# Patient Record
Sex: Female | Born: 1955 | Race: White | Hispanic: No | Marital: Married | Smoking: Never smoker
Health system: Southern US, Community
[De-identification: ages and names within clinical notes are randomized; demographics above are authoritative.]

## PROBLEM LIST (undated history)

## (undated) DIAGNOSIS — Z9221 Personal history of antineoplastic chemotherapy: Secondary | ICD-10-CM

## (undated) DIAGNOSIS — F329 Major depressive disorder, single episode, unspecified: Secondary | ICD-10-CM

## (undated) DIAGNOSIS — F32A Depression, unspecified: Secondary | ICD-10-CM

## (undated) DIAGNOSIS — Z803 Family history of malignant neoplasm of breast: Secondary | ICD-10-CM

## (undated) DIAGNOSIS — C50911 Malignant neoplasm of unspecified site of right female breast: Secondary | ICD-10-CM

## (undated) DIAGNOSIS — R42 Dizziness and giddiness: Secondary | ICD-10-CM

## (undated) DIAGNOSIS — I499 Cardiac arrhythmia, unspecified: Secondary | ICD-10-CM

## (undated) DIAGNOSIS — C44310 Basal cell carcinoma of skin of unspecified parts of face: Secondary | ICD-10-CM

## (undated) DIAGNOSIS — F411 Generalized anxiety disorder: Secondary | ICD-10-CM

## (undated) DIAGNOSIS — Z923 Personal history of irradiation: Secondary | ICD-10-CM

## (undated) HISTORY — PX: ABDOMINAL HYSTERECTOMY: SHX81

## (undated) HISTORY — PX: COLONOSCOPY: SHX174

## (undated) HISTORY — PX: BREAST BIOPSY: SHX20

## (undated) HISTORY — PX: BREAST EXCISIONAL BIOPSY: SUR124

## (undated) HISTORY — PX: TONSILLECTOMY: SUR1361

## (undated) HISTORY — DX: Dizziness and giddiness: R42

## (undated) HISTORY — PX: BASAL CELL CARCINOMA EXCISION: SHX1214

## (undated) HISTORY — DX: Family history of malignant neoplasm of breast: Z80.3

## (undated) HISTORY — DX: Generalized anxiety disorder: F41.1

---

## 2000-05-03 ENCOUNTER — Encounter: Payer: Self-pay | Admitting: Gynecology

## 2000-05-03 ENCOUNTER — Encounter: Admission: RE | Admit: 2000-05-03 | Discharge: 2000-05-03 | Payer: Self-pay | Admitting: Gynecology

## 2000-05-09 ENCOUNTER — Encounter: Payer: Self-pay | Admitting: Gynecology

## 2000-05-09 ENCOUNTER — Encounter: Admission: RE | Admit: 2000-05-09 | Discharge: 2000-05-09 | Payer: Self-pay | Admitting: Gynecology

## 2000-06-13 ENCOUNTER — Other Ambulatory Visit: Admission: RE | Admit: 2000-06-13 | Discharge: 2000-06-13 | Payer: Self-pay | Admitting: Gynecology

## 2002-02-04 ENCOUNTER — Other Ambulatory Visit: Admission: RE | Admit: 2002-02-04 | Discharge: 2002-02-04 | Payer: Self-pay | Admitting: Gynecology

## 2002-09-24 ENCOUNTER — Encounter: Admission: RE | Admit: 2002-09-24 | Discharge: 2002-09-24 | Payer: Self-pay | Admitting: Gynecology

## 2002-09-24 ENCOUNTER — Encounter: Payer: Self-pay | Admitting: Gynecology

## 2003-09-30 ENCOUNTER — Encounter: Admission: RE | Admit: 2003-09-30 | Discharge: 2003-09-30 | Payer: Self-pay | Admitting: Gynecology

## 2003-12-31 ENCOUNTER — Other Ambulatory Visit: Admission: RE | Admit: 2003-12-31 | Discharge: 2003-12-31 | Payer: Self-pay | Admitting: Gynecology

## 2004-10-16 DIAGNOSIS — C50911 Malignant neoplasm of unspecified site of right female breast: Secondary | ICD-10-CM | POA: Insufficient documentation

## 2004-10-20 ENCOUNTER — Encounter: Admission: RE | Admit: 2004-10-20 | Discharge: 2004-10-20 | Payer: Self-pay | Admitting: Gynecology

## 2004-10-20 ENCOUNTER — Encounter (INDEPENDENT_AMBULATORY_CARE_PROVIDER_SITE_OTHER): Payer: Self-pay | Admitting: *Deleted

## 2004-10-20 ENCOUNTER — Encounter (INDEPENDENT_AMBULATORY_CARE_PROVIDER_SITE_OTHER): Payer: Self-pay | Admitting: Radiology

## 2004-10-20 DIAGNOSIS — C50911 Malignant neoplasm of unspecified site of right female breast: Secondary | ICD-10-CM

## 2004-10-20 HISTORY — PX: BREAST BIOPSY: SHX20

## 2004-10-20 HISTORY — DX: Malignant neoplasm of unspecified site of right female breast: C50.911

## 2004-10-25 ENCOUNTER — Ambulatory Visit: Payer: Self-pay | Admitting: Family Medicine

## 2004-10-27 ENCOUNTER — Encounter: Admission: RE | Admit: 2004-10-27 | Discharge: 2004-10-27 | Payer: Self-pay | Admitting: Surgery

## 2004-11-02 ENCOUNTER — Ambulatory Visit: Payer: Self-pay | Admitting: Oncology

## 2004-11-16 ENCOUNTER — Ambulatory Visit (HOSPITAL_COMMUNITY): Admission: RE | Admit: 2004-11-16 | Discharge: 2004-11-16 | Payer: Self-pay | Admitting: Oncology

## 2004-12-26 ENCOUNTER — Encounter: Admission: RE | Admit: 2004-12-26 | Discharge: 2004-12-26 | Payer: Self-pay | Admitting: Oncology

## 2004-12-31 ENCOUNTER — Ambulatory Visit: Payer: Self-pay | Admitting: Oncology

## 2004-12-31 ENCOUNTER — Ambulatory Visit: Admission: RE | Admit: 2004-12-31 | Discharge: 2004-12-31 | Payer: Self-pay | Admitting: Oncology

## 2005-02-04 ENCOUNTER — Encounter: Admission: RE | Admit: 2005-02-04 | Discharge: 2005-02-04 | Payer: Self-pay | Admitting: Oncology

## 2005-02-09 ENCOUNTER — Ambulatory Visit (HOSPITAL_COMMUNITY): Admission: RE | Admit: 2005-02-09 | Discharge: 2005-02-09 | Payer: Self-pay | Admitting: Surgery

## 2005-02-09 ENCOUNTER — Ambulatory Visit (HOSPITAL_BASED_OUTPATIENT_CLINIC_OR_DEPARTMENT_OTHER): Admission: RE | Admit: 2005-02-09 | Discharge: 2005-02-09 | Payer: Self-pay | Admitting: Surgery

## 2005-02-15 ENCOUNTER — Ambulatory Visit: Payer: Self-pay | Admitting: Oncology

## 2005-03-18 DIAGNOSIS — Z9221 Personal history of antineoplastic chemotherapy: Secondary | ICD-10-CM

## 2005-03-18 HISTORY — DX: Personal history of antineoplastic chemotherapy: Z92.21

## 2005-03-26 ENCOUNTER — Encounter: Admission: RE | Admit: 2005-03-26 | Discharge: 2005-03-26 | Payer: Self-pay | Admitting: Oncology

## 2005-04-04 ENCOUNTER — Ambulatory Visit: Payer: Self-pay | Admitting: Oncology

## 2005-04-18 HISTORY — PX: BREAST LUMPECTOMY: SHX2

## 2005-04-27 ENCOUNTER — Ambulatory Visit (HOSPITAL_BASED_OUTPATIENT_CLINIC_OR_DEPARTMENT_OTHER): Admission: RE | Admit: 2005-04-27 | Discharge: 2005-04-27 | Payer: Self-pay | Admitting: Surgery

## 2005-04-27 ENCOUNTER — Encounter (INDEPENDENT_AMBULATORY_CARE_PROVIDER_SITE_OTHER): Payer: Self-pay | Admitting: Specialist

## 2005-05-19 DIAGNOSIS — Z923 Personal history of irradiation: Secondary | ICD-10-CM

## 2005-05-19 HISTORY — DX: Personal history of irradiation: Z92.3

## 2005-05-20 ENCOUNTER — Ambulatory Visit: Payer: Self-pay | Admitting: Oncology

## 2005-05-26 ENCOUNTER — Ambulatory Visit: Admission: RE | Admit: 2005-05-26 | Discharge: 2005-05-26 | Payer: Self-pay | Admitting: Oncology

## 2005-05-26 ENCOUNTER — Encounter (INDEPENDENT_AMBULATORY_CARE_PROVIDER_SITE_OTHER): Payer: Self-pay | Admitting: *Deleted

## 2005-05-27 ENCOUNTER — Ambulatory Visit: Admission: RE | Admit: 2005-05-27 | Discharge: 2005-08-25 | Payer: Self-pay | Admitting: Radiation Oncology

## 2005-07-26 ENCOUNTER — Ambulatory Visit: Payer: Self-pay | Admitting: Oncology

## 2005-08-17 ENCOUNTER — Encounter: Payer: Self-pay | Admitting: Internal Medicine

## 2005-08-17 ENCOUNTER — Ambulatory Visit: Payer: Self-pay | Admitting: Internal Medicine

## 2005-08-17 ENCOUNTER — Ambulatory Visit (HOSPITAL_COMMUNITY): Admission: RE | Admit: 2005-08-17 | Discharge: 2005-08-17 | Payer: Self-pay | Admitting: Oncology

## 2005-08-19 LAB — ESTRADIOL: Estradiol: 18.9 pg/mL

## 2005-08-30 ENCOUNTER — Encounter: Admission: RE | Admit: 2005-08-30 | Discharge: 2005-08-30 | Payer: Self-pay | Admitting: Oncology

## 2005-09-08 ENCOUNTER — Ambulatory Visit: Payer: Self-pay | Admitting: Oncology

## 2005-10-12 LAB — CBC WITH DIFFERENTIAL/PLATELET
BASO%: 0.4 % (ref 0.0–2.0)
Basophils Absolute: 0 10*3/uL (ref 0.0–0.1)
EOS%: 3.4 % (ref 0.0–7.0)
HCT: 36.7 % (ref 34.8–46.6)
HGB: 12.6 g/dL (ref 11.6–15.9)
MCH: 31.3 pg (ref 26.0–34.0)
MONO#: 0.3 10*3/uL (ref 0.1–0.9)
NEUT%: 64.1 % (ref 39.6–76.8)
RDW: 13.4 % (ref 11.3–14.5)
WBC: 4.1 10*3/uL (ref 3.9–10.0)
lymph#: 1 10*3/uL (ref 0.9–3.3)

## 2005-10-12 LAB — COMPREHENSIVE METABOLIC PANEL
ALT: 19 U/L (ref 0–40)
AST: 19 U/L (ref 0–37)
Albumin: 4.5 g/dL (ref 3.5–5.2)
BUN: 18 mg/dL (ref 6–23)
CO2: 26 mEq/L (ref 19–32)
Calcium: 9.1 mg/dL (ref 8.4–10.5)
Chloride: 103 mEq/L (ref 96–112)
Potassium: 4 mEq/L (ref 3.5–5.3)

## 2005-10-27 ENCOUNTER — Ambulatory Visit: Payer: Self-pay | Admitting: Oncology

## 2005-11-09 ENCOUNTER — Encounter: Payer: Self-pay | Admitting: Cardiology

## 2005-11-09 ENCOUNTER — Ambulatory Visit: Payer: Self-pay | Admitting: Cardiology

## 2005-11-09 ENCOUNTER — Encounter: Admission: RE | Admit: 2005-11-09 | Discharge: 2005-11-09 | Payer: Self-pay | Admitting: Oncology

## 2005-11-09 ENCOUNTER — Ambulatory Visit: Admission: RE | Admit: 2005-11-09 | Discharge: 2005-11-09 | Payer: Self-pay | Admitting: Oncology

## 2005-11-22 LAB — COMPREHENSIVE METABOLIC PANEL
AST: 17 U/L (ref 0–37)
Albumin: 4.4 g/dL (ref 3.5–5.2)
BUN: 15 mg/dL (ref 6–23)
CO2: 28 mEq/L (ref 19–32)
Calcium: 9.5 mg/dL (ref 8.4–10.5)
Chloride: 104 mEq/L (ref 96–112)
Creatinine, Ser: 0.74 mg/dL (ref 0.40–1.20)
Glucose, Bld: 130 mg/dL — ABNORMAL HIGH (ref 70–99)
Potassium: 4.4 mEq/L (ref 3.5–5.3)

## 2005-11-22 LAB — CBC WITH DIFFERENTIAL/PLATELET
Basophils Absolute: 0 10*3/uL (ref 0.0–0.1)
Eosinophils Absolute: 0.1 10*3/uL (ref 0.0–0.5)
HCT: 38.8 % (ref 34.8–46.6)
HGB: 13 g/dL (ref 11.6–15.9)
LYMPH%: 30.1 % (ref 14.0–48.0)
MCHC: 33.6 g/dL (ref 32.0–36.0)
MONO#: 0.3 10*3/uL (ref 0.1–0.9)
NEUT#: 2.4 10*3/uL (ref 1.5–6.5)
NEUT%: 59.6 % (ref 39.6–76.8)
Platelets: 160 10*3/uL (ref 145–400)
WBC: 4 10*3/uL (ref 3.9–10.0)
lymph#: 1.2 10*3/uL (ref 0.9–3.3)

## 2005-12-13 IMAGING — CT CT PELVIS W/ CM
1 of 4 series · 13 of 32 positions shown, 18 images · IV contrast (omnipaque)
Comparison: None.

CT CHEST

CLINICAL DATA: New diagnosis right breast cancer. Initial staging prior to
therapy.

CT OF THE CHEST, ABDOMEN, AND PELVIS WITH CONTRAST  11/16/2004:
TECHNIQUE: Multidetector CT imaging of the chest, abdomen, and pelvis was
performed during bolus administration of intravenous contrast. Oral contrast was
given. Delayed imaging through the kidneys and bladder was performed.
Contrast:  150 cc Omnipaque 300

[Series 2: cap w/iv 5.0 b30f · axial · 0.65mm/px · z∈[-592,-47]mm · 13 of 125 slices shown, 18 images]
[im 8/125  soft-tissue]
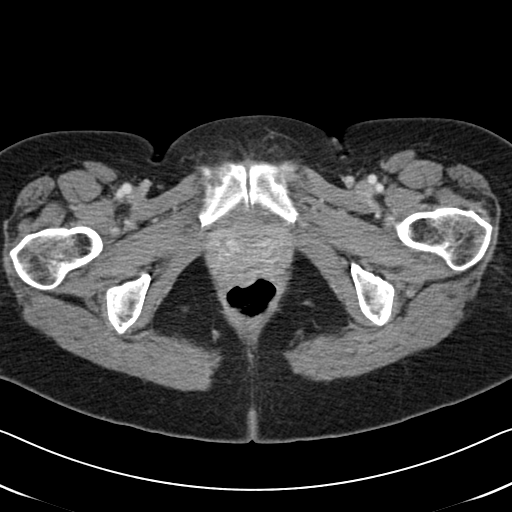
[im 8/125  bone]
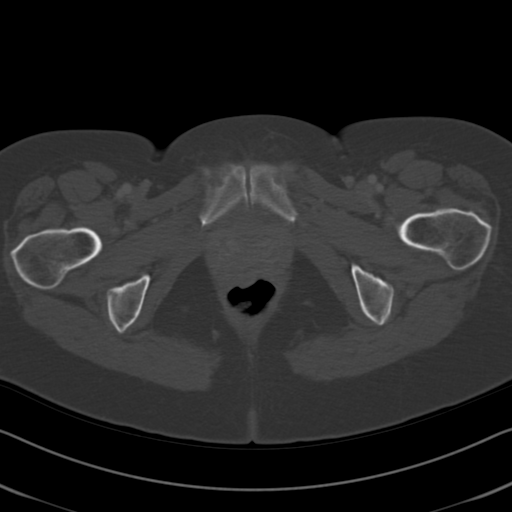
[im 16/125  soft-tissue]
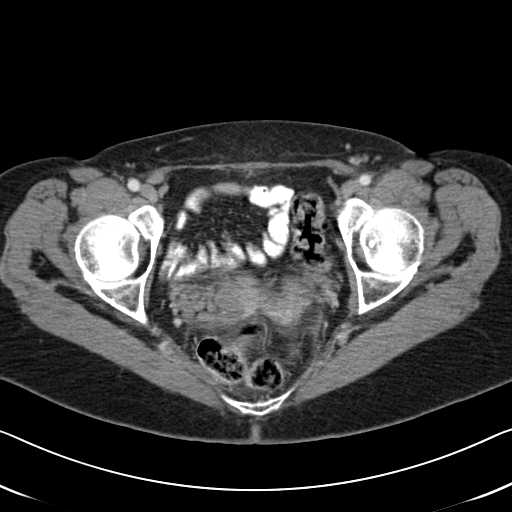
[im 32/125  soft-tissue]
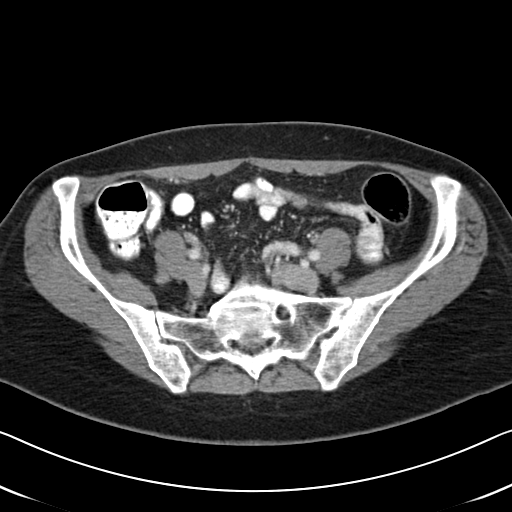
[im 39/125  soft-tissue]
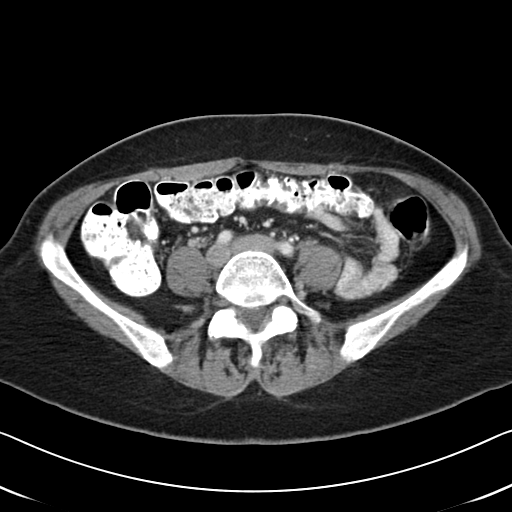
[im 47/125  soft-tissue]
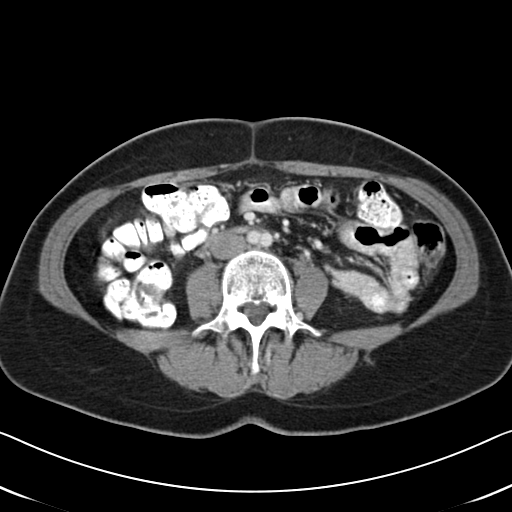
[im 55/125  soft-tissue]
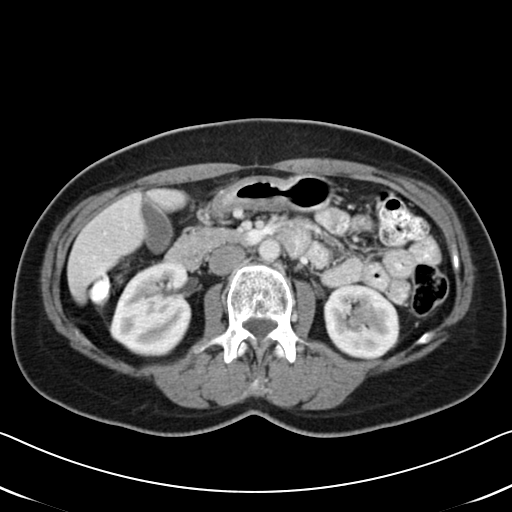
[im 70/125  soft-tissue]
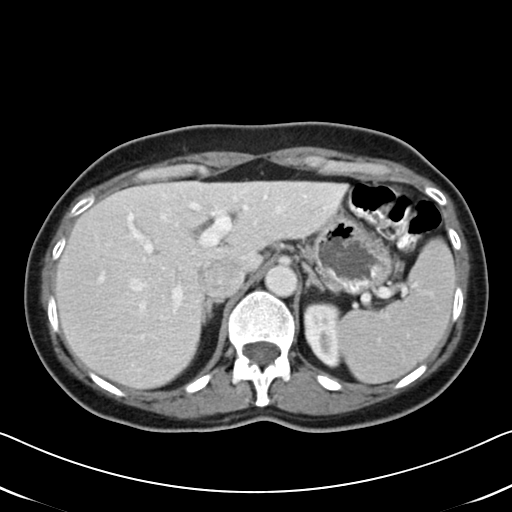
[im 78/125  soft-tissue]
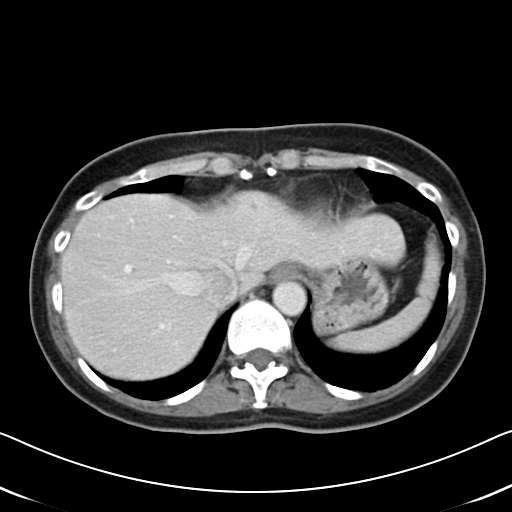
[im 86/125  soft-tissue]
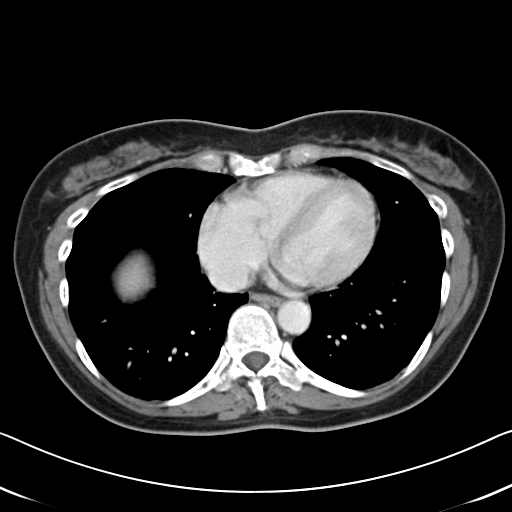
[im 86/125  bone]
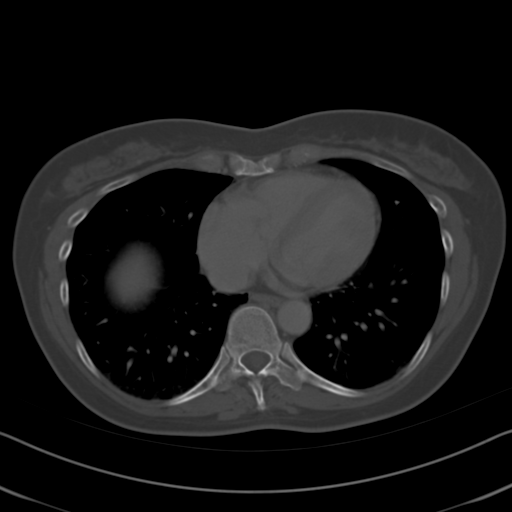
[im 94/125  soft-tissue]
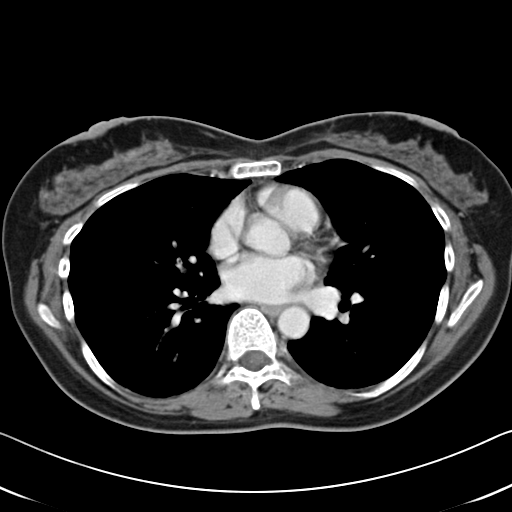
[im 94/125  lung]
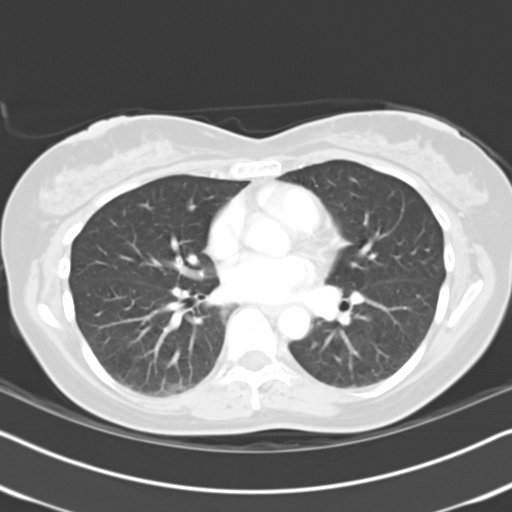
[im 101/125  lung]
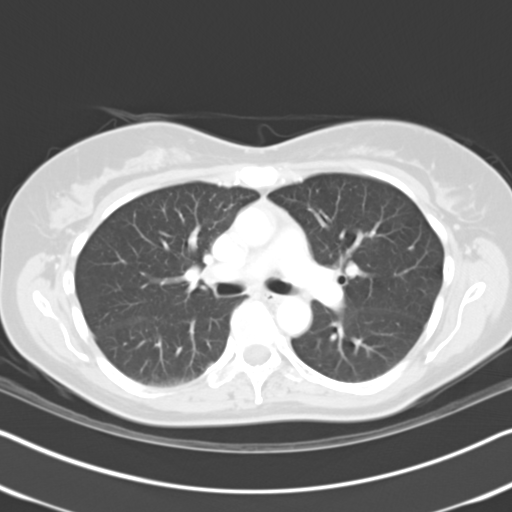
[im 109/125  soft-tissue]
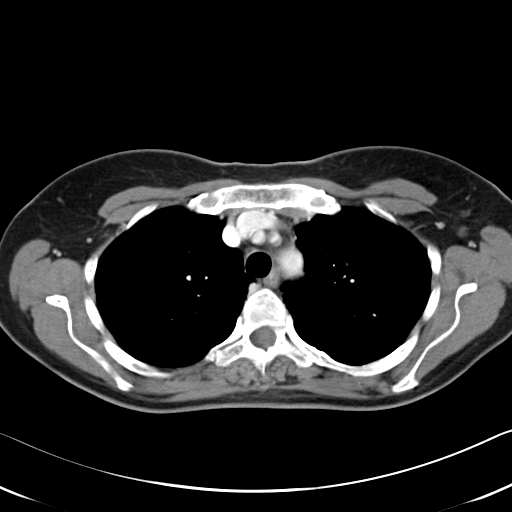
[im 109/125  lung]
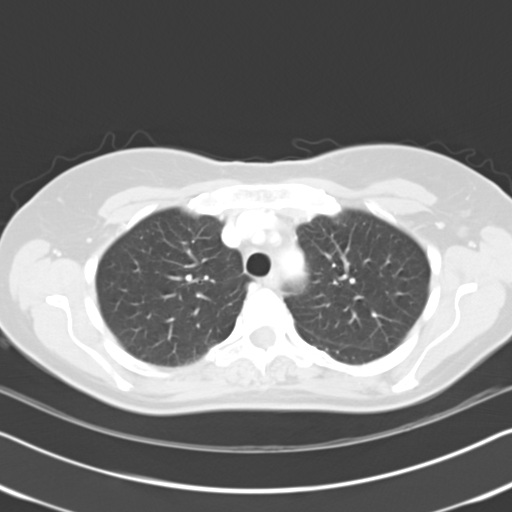
[im 117/125  soft-tissue]
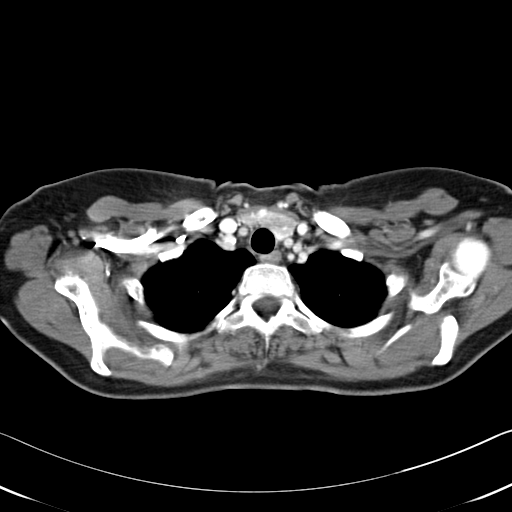
[im 117/125  lung]
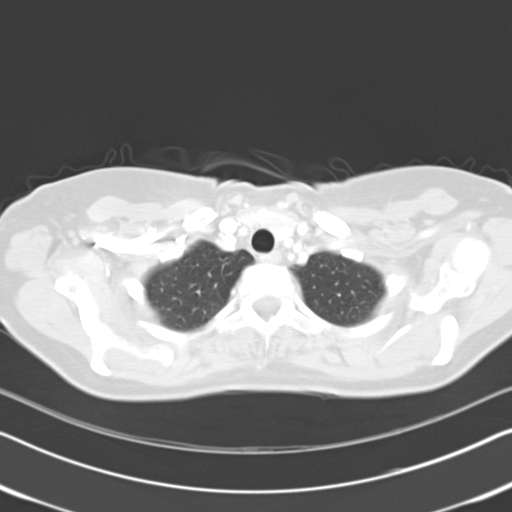

[13 of 32 positions shown; findings below may reference images not displayed]

FINDINGS: No significant mediastinal, hilar, or axillary lymphadenopathy is
detected. The heart size is normal. There is no pericardial effusion. There are
no pleural effusions. No pulmonary parenchymal nodules are identified to suggest
metastatic disease. In fact, the pulmonary parenchyma appears clear. Review of
the bone window images demonstrates no definite osseous metastatic disease. Note
is made of thyroid gland enlargement with multiple bilateral nodules.
IMPRESSION: 1. No evidence of metastatic disease in the chest.
2. No acute cardiopulmonary disease.
3. Multinodular goiter.

CT ABDOMEN
FINDINGS: There is a simple cyst in the anterior segment of the right lobe of
the liver at the dome. There are at least 4 other sub-1 mm low attenuation
lesions in the liver which are indeterminate. One of these in the caudate lobe
is worrisome for a solid lesion. However, they are all nonspecific. The spleen
and pancreas are normal. The adrenal glands and kidneys are normal. The
gallbladder is unremarkable by CT and there is no biliary ductal dilation. There
is no significant lymphadenopathy. The abdominal aorta is normal in appearance.
There is no ascites. Review of the bone window images demonstrates no definite
osseous metastatic disease.
IMPRESSION: 1. At least 4 sub-1 mm low attenuation lesions in the liver, indeterminate.
Their conspicuity for their small size suggested they may be cysts, though the
lesion in the caudate lobe is suspicious for a small solid nodule. These can be
followed on subsequent CT examinations.
2. No evidence of metastatic disease elsewhere in the abdomen.
3. No acute abnormalities in the abdomen.

CT PELVIS
FINDINGS: There is no significant lymphadenopathy in the pelvis. Bilateral
ovarian cysts are present, that on the right measuring approximately 2 cm in
diameter and that on the left measuring approximately 3.7 x 2.2 cm. Sigmoid
diverticulosis is present without evidence of diverticulitis. The small bowel is
unremarkable. There is no ascites. The urinary bladder is normal. The uterus is
surgically absent. Review of the bone window images demonstrates no definite
osseous metastatic disease.
IMPRESSION: 1. No evidence of metastatic disease in the pelvis.
2. Sigmoid diverticulosis.
3. Bilateral ovarian cysts, the largest involving the left ovary measuring
approximately 3.7 x 2.2 cm.

## 2005-12-14 ENCOUNTER — Ambulatory Visit: Payer: Self-pay | Admitting: Oncology

## 2006-01-23 ENCOUNTER — Ambulatory Visit: Payer: Self-pay | Admitting: Oncology

## 2006-02-17 ENCOUNTER — Other Ambulatory Visit: Admission: RE | Admit: 2006-02-17 | Discharge: 2006-02-17 | Payer: Self-pay | Admitting: Gynecology

## 2006-03-01 ENCOUNTER — Ambulatory Visit: Admission: RE | Admit: 2006-03-01 | Discharge: 2006-03-01 | Payer: Self-pay | Admitting: Oncology

## 2006-03-01 ENCOUNTER — Ambulatory Visit: Payer: Self-pay | Admitting: Cardiology

## 2006-03-01 ENCOUNTER — Encounter: Payer: Self-pay | Admitting: Cardiology

## 2006-03-07 LAB — CBC WITH DIFFERENTIAL/PLATELET
Eosinophils Absolute: 0.2 10*3/uL (ref 0.0–0.5)
HCT: 38.5 % (ref 34.8–46.6)
LYMPH%: 30.1 % (ref 14.0–48.0)
MONO#: 0.3 10*3/uL (ref 0.1–0.9)
NEUT#: 2 10*3/uL (ref 1.5–6.5)
NEUT%: 55.2 % (ref 39.6–76.8)
Platelets: 162 10*3/uL (ref 145–400)
WBC: 3.7 10*3/uL — ABNORMAL LOW (ref 3.9–10.0)
lymph#: 1.1 10*3/uL (ref 0.9–3.3)

## 2006-03-17 LAB — FOLLICLE STIMULATING HORMONE: FSH: 89.6 m[IU]/mL

## 2006-03-17 LAB — COMPREHENSIVE METABOLIC PANEL
CO2: 27 mEq/L (ref 19–32)
Calcium: 9.1 mg/dL (ref 8.4–10.5)
Chloride: 104 mEq/L (ref 96–112)
Creatinine, Ser: 0.68 mg/dL (ref 0.40–1.20)
Glucose, Bld: 77 mg/dL (ref 70–99)
Sodium: 138 mEq/L (ref 135–145)
Total Bilirubin: 0.3 mg/dL (ref 0.3–1.2)
Total Protein: 6.6 g/dL (ref 6.0–8.3)

## 2006-03-17 LAB — ESTRADIOL, ULTRA SENS: Estradiol, Ultra Sensitive: 5 pg/mL

## 2006-03-24 ENCOUNTER — Ambulatory Visit: Payer: Self-pay | Admitting: Oncology

## 2006-05-05 ENCOUNTER — Ambulatory Visit: Payer: Self-pay | Admitting: Oncology

## 2006-06-13 ENCOUNTER — Ambulatory Visit: Admission: RE | Admit: 2006-06-13 | Discharge: 2006-06-13 | Payer: Self-pay | Admitting: Oncology

## 2006-06-13 ENCOUNTER — Encounter (INDEPENDENT_AMBULATORY_CARE_PROVIDER_SITE_OTHER): Payer: Self-pay | Admitting: Cardiology

## 2006-06-22 ENCOUNTER — Ambulatory Visit (HOSPITAL_COMMUNITY): Admission: RE | Admit: 2006-06-22 | Discharge: 2006-06-22 | Payer: Self-pay | Admitting: Oncology

## 2006-06-29 ENCOUNTER — Ambulatory Visit: Payer: Self-pay | Admitting: Oncology

## 2006-07-04 LAB — CBC WITH DIFFERENTIAL/PLATELET
Basophils Absolute: 0 10*3/uL (ref 0.0–0.1)
EOS%: 3.8 % (ref 0.0–7.0)
HCT: 39.8 % (ref 34.8–46.6)
HGB: 13.8 g/dL (ref 11.6–15.9)
MCH: 30.6 pg (ref 26.0–34.0)
MCV: 88.2 fL (ref 81.0–101.0)
MONO%: 8.6 % (ref 0.0–13.0)
NEUT%: 58.9 % (ref 39.6–76.8)
RDW: 13.2 % (ref 11.3–14.5)

## 2006-07-04 LAB — COMPREHENSIVE METABOLIC PANEL
Alkaline Phosphatase: 59 U/L (ref 39–117)
BUN: 15 mg/dL (ref 6–23)
CO2: 29 mEq/L (ref 19–32)
Glucose, Bld: 98 mg/dL (ref 70–99)
Sodium: 140 mEq/L (ref 135–145)
Total Bilirubin: 0.4 mg/dL (ref 0.3–1.2)
Total Protein: 7.3 g/dL (ref 6.0–8.3)

## 2006-07-04 LAB — FOLLICLE STIMULATING HORMONE: FSH: 56.5 m[IU]/mL

## 2006-07-18 LAB — ESTRADIOL, ULTRA SENS: Estradiol, Ultra Sensitive: 10 pg/mL

## 2006-07-28 ENCOUNTER — Ambulatory Visit (HOSPITAL_BASED_OUTPATIENT_CLINIC_OR_DEPARTMENT_OTHER): Admission: RE | Admit: 2006-07-28 | Discharge: 2006-07-28 | Payer: Self-pay | Admitting: Surgery

## 2006-10-05 ENCOUNTER — Ambulatory Visit: Payer: Self-pay | Admitting: Oncology

## 2006-10-10 LAB — CBC WITH DIFFERENTIAL/PLATELET
BASO%: 0.4 % (ref 0.0–2.0)
EOS%: 2.7 % (ref 0.0–7.0)
HCT: 39.8 % (ref 34.8–46.6)
HGB: 13.6 g/dL (ref 11.6–15.9)
MCH: 30.1 pg (ref 26.0–34.0)
MCHC: 34.2 g/dL (ref 32.0–36.0)
MONO#: 0.4 10*3/uL (ref 0.1–0.9)
RDW: 12.9 % (ref 11.3–14.5)
WBC: 5.1 10*3/uL (ref 3.9–10.0)
lymph#: 1.3 10*3/uL (ref 0.9–3.3)

## 2006-10-10 LAB — COMPREHENSIVE METABOLIC PANEL
ALT: 14 U/L (ref 0–35)
AST: 16 U/L (ref 0–37)
Albumin: 4.5 g/dL (ref 3.5–5.2)
CO2: 25 mEq/L (ref 19–32)
Calcium: 9.6 mg/dL (ref 8.4–10.5)
Chloride: 104 mEq/L (ref 96–112)
Potassium: 3.8 mEq/L (ref 3.5–5.3)
Total Protein: 7.2 g/dL (ref 6.0–8.3)

## 2006-10-10 LAB — CANCER ANTIGEN 27.29: CA 27.29: 13 U/mL (ref 0–39)

## 2006-10-16 ENCOUNTER — Encounter (INDEPENDENT_AMBULATORY_CARE_PROVIDER_SITE_OTHER): Payer: Self-pay | Admitting: Internal Medicine

## 2006-10-21 LAB — ESTRADIOL, ULTRA SENS: Estradiol, Ultra Sensitive: <2 pg/mL

## 2006-11-13 ENCOUNTER — Encounter: Admission: RE | Admit: 2006-11-13 | Discharge: 2006-11-13 | Payer: Self-pay | Admitting: Oncology

## 2007-01-04 ENCOUNTER — Encounter: Admission: RE | Admit: 2007-01-04 | Discharge: 2007-01-04 | Payer: Self-pay | Admitting: Oncology

## 2007-03-08 ENCOUNTER — Ambulatory Visit: Payer: Self-pay | Admitting: Oncology

## 2007-03-12 LAB — CBC WITH DIFFERENTIAL/PLATELET
BASO%: 0.4 % (ref 0.0–2.0)
Basophils Absolute: 0 10*3/uL (ref 0.0–0.1)
EOS%: 3 % (ref 0.0–7.0)
HGB: 14 g/dL (ref 11.6–15.9)
MCH: 29.7 pg (ref 26.0–34.0)
MCHC: 34.6 g/dL (ref 32.0–36.0)
MCV: 85.8 fL (ref 81.0–101.0)
MONO%: 6.1 % (ref 0.0–13.0)
NEUT%: 56.1 % (ref 39.6–76.8)
RDW: 13.1 % (ref 11.3–14.5)
lymph#: 2 10*3/uL (ref 0.9–3.3)

## 2007-03-12 LAB — COMPREHENSIVE METABOLIC PANEL
ALT: 12 U/L (ref 0–35)
AST: 15 U/L (ref 0–37)
Alkaline Phosphatase: 59 U/L (ref 39–117)
BUN: 18 mg/dL (ref 6–23)
Creatinine, Ser: 0.88 mg/dL (ref 0.40–1.20)
Potassium: 4.1 mEq/L (ref 3.5–5.3)

## 2007-08-27 ENCOUNTER — Ambulatory Visit: Payer: Self-pay | Admitting: Family Medicine

## 2007-08-27 DIAGNOSIS — M545 Low back pain, unspecified: Secondary | ICD-10-CM | POA: Insufficient documentation

## 2007-08-27 LAB — CONVERTED CEMR LAB
Nitrite: NEGATIVE
Specific Gravity, Urine: >=1.03
WBC Urine, dipstick: NEGATIVE
pH: 6

## 2007-08-28 ENCOUNTER — Ambulatory Visit: Payer: Self-pay | Admitting: Oncology

## 2007-09-03 ENCOUNTER — Encounter: Admission: RE | Admit: 2007-09-03 | Discharge: 2007-09-03 | Payer: Self-pay | Admitting: Oncology

## 2007-11-14 ENCOUNTER — Encounter: Admission: RE | Admit: 2007-11-14 | Discharge: 2007-11-14 | Payer: Self-pay | Admitting: Oncology

## 2008-03-27 ENCOUNTER — Ambulatory Visit: Payer: Self-pay | Admitting: Family Medicine

## 2008-03-27 DIAGNOSIS — F411 Generalized anxiety disorder: Secondary | ICD-10-CM | POA: Insufficient documentation

## 2008-03-27 LAB — CONVERTED CEMR LAB
Bacteria, UA: 0
Glucose, Urine, Semiquant: NEGATIVE
Ketones, urine, test strip: NEGATIVE
Urobilinogen, UA: 0.2
pH: 6.5

## 2008-06-04 ENCOUNTER — Ambulatory Visit: Payer: Self-pay | Admitting: Family Medicine

## 2008-08-22 ENCOUNTER — Ambulatory Visit: Payer: Self-pay | Admitting: Oncology

## 2008-08-26 LAB — CBC WITH DIFFERENTIAL/PLATELET
Basophils Absolute: 0 10*3/uL (ref 0.0–0.1)
Eosinophils Absolute: 0.1 10*3/uL (ref 0.0–0.5)
HGB: 13.6 g/dL (ref 11.6–15.9)
LYMPH%: 41.8 % (ref 14.0–49.7)
MCV: 88.1 fL (ref 79.5–101.0)
MONO#: 0.3 10*3/uL (ref 0.1–0.9)
MONO%: 6.5 % (ref 0.0–14.0)
NEUT#: 2.3 10*3/uL (ref 1.5–6.5)
Platelets: 190 10*3/uL (ref 145–400)
RBC: 4.55 10*6/uL (ref 3.70–5.45)
WBC: 4.7 10*3/uL (ref 3.9–10.3)

## 2008-08-27 LAB — COMPREHENSIVE METABOLIC PANEL
Albumin: 4.8 g/dL (ref 3.5–5.2)
Alkaline Phosphatase: 58 U/L (ref 39–117)
BUN: 15 mg/dL (ref 6–23)
CO2: 25 mEq/L (ref 19–32)
Glucose, Bld: 75 mg/dL (ref 70–99)
Potassium: 3.7 mEq/L (ref 3.5–5.3)
Sodium: 140 mEq/L (ref 135–145)
Total Bilirubin: 0.4 mg/dL (ref 0.3–1.2)
Total Protein: 7.2 g/dL (ref 6.0–8.3)

## 2008-08-27 LAB — VITAMIN D 25 HYDROXY (VIT D DEFICIENCY, FRACTURES): Vit D, 25-Hydroxy: 54 ng/mL (ref 30–89)

## 2008-11-14 ENCOUNTER — Encounter: Admission: RE | Admit: 2008-11-14 | Discharge: 2008-11-14 | Payer: Self-pay | Admitting: Oncology

## 2009-01-09 ENCOUNTER — Telehealth (INDEPENDENT_AMBULATORY_CARE_PROVIDER_SITE_OTHER): Payer: Self-pay | Admitting: Internal Medicine

## 2009-01-30 ENCOUNTER — Ambulatory Visit: Payer: Self-pay | Admitting: Internal Medicine

## 2009-01-30 DIAGNOSIS — R42 Dizziness and giddiness: Secondary | ICD-10-CM | POA: Insufficient documentation

## 2009-05-08 ENCOUNTER — Ambulatory Visit: Payer: Self-pay | Admitting: Family Medicine

## 2009-05-08 LAB — CONVERTED CEMR LAB
Bilirubin Urine: NEGATIVE
Ketones, urine, test strip: NEGATIVE
Nitrite: POSITIVE
pH: 6

## 2009-08-26 ENCOUNTER — Ambulatory Visit: Payer: Self-pay | Admitting: Oncology

## 2009-08-27 LAB — CBC WITH DIFFERENTIAL/PLATELET
BASO%: 0.4 % (ref 0.0–2.0)
Basophils Absolute: 0 10*3/uL (ref 0.0–0.1)
Eosinophils Absolute: 0.1 10*3/uL (ref 0.0–0.5)
HCT: 39 % (ref 34.8–46.6)
HGB: 13.3 g/dL (ref 11.6–15.9)
LYMPH%: 30 % (ref 14.0–49.7)
MCHC: 34.2 g/dL (ref 31.5–36.0)
MONO#: 0.5 10*3/uL (ref 0.1–0.9)
NEUT#: 3.8 10*3/uL (ref 1.5–6.5)
NEUT%: 59.8 % (ref 38.4–76.8)
Platelets: 178 10*3/uL (ref 145–400)
WBC: 6.3 10*3/uL (ref 3.9–10.3)
lymph#: 1.9 10*3/uL (ref 0.9–3.3)

## 2009-08-27 LAB — COMPREHENSIVE METABOLIC PANEL
ALT: 28 U/L (ref 0–35)
CO2: 29 mEq/L (ref 19–32)
Calcium: 9.3 mg/dL (ref 8.4–10.5)
Chloride: 107 mEq/L (ref 96–112)
Creatinine, Ser: 0.74 mg/dL (ref 0.40–1.20)
Glucose, Bld: 111 mg/dL — ABNORMAL HIGH (ref 70–99)
Total Bilirubin: 0.7 mg/dL (ref 0.3–1.2)
Total Protein: 6.9 g/dL (ref 6.0–8.3)

## 2009-08-27 LAB — CANCER ANTIGEN 27.29: CA 27.29: 15 U/mL (ref 0–39)

## 2009-09-03 ENCOUNTER — Encounter: Admission: RE | Admit: 2009-09-03 | Discharge: 2009-09-03 | Payer: Self-pay | Admitting: Oncology

## 2009-09-10 ENCOUNTER — Encounter: Payer: Self-pay | Admitting: Family Medicine

## 2009-11-19 ENCOUNTER — Encounter: Admission: RE | Admit: 2009-11-19 | Discharge: 2009-11-19 | Payer: Self-pay | Admitting: Oncology

## 2009-11-20 ENCOUNTER — Encounter: Admission: RE | Admit: 2009-11-20 | Discharge: 2009-11-20 | Payer: Self-pay | Admitting: Gynecology

## 2009-11-26 ENCOUNTER — Encounter: Admission: RE | Admit: 2009-11-26 | Discharge: 2009-11-26 | Payer: Self-pay | Admitting: Gynecology

## 2010-05-09 ENCOUNTER — Encounter: Payer: Self-pay | Admitting: Oncology

## 2010-05-18 NOTE — Letter (Signed)
[facility]   Imported By: Lennie Odor 09/25/2009 15:18:53  _____________________________________________________________________  External Attachment:    Type:   Image     Comment:   External Document

## 2010-05-18 NOTE — Assessment & Plan Note (Signed)
Summary: 11:30 BACK PAIN/CLE   Vital Signs:  Patient profile:   55 year old female Height:      65.25 inches Weight:      178 pounds BMI:     29.50 Temp:     98.4 degrees F oral Pulse rate:   92 / minute Pulse rhythm:   regular BP sitting:   124 / 80  (left arm) Cuff size:   regular  Vitals Entered By: Delilah Shan CMA Duncan Dull) (May 08, 2009 11:27 AM) CC: Back pain   History of Present Illness: 74 with back pain.  Was bending a lot cleaning out cabinets and lifting heavy objects on Monday. Woke up the next day with bilateral lower back pain. Denies any radiculopathy, tingling, or leg weakness. Pain is worse with walking, standing, or turning. Denies dysuria, increased urinary frequency or incontinence. Has tried OTC Ibuprofen with no relief of symptoms.  Current Medications (verified): 1)  Calcium Citrate-Vitamin D 315-200 Mg-Unit  Tabs (Calcium Citrate-Vitamin D) .... Take 1 Tablet By Mouth Two Times A Day 2)  Boniva 150 Mg Tabs (Ibandronate Sodium) .... Take One By Mouth Monthly 3)  Zoloft 25 Mg Tabs (Sertraline Hcl) .... Take 1 and 1/2 Tabs Daily By Mouth 4)  Aromasin 25 Mg Tabs (Exemestane) .... One Daily 5)  Antivert 12.5 Mg Tabs (Meclizine Hcl) .... Take 1/2 To 2 Tabs Every 8 Hrs = 6.25 Mg To 25 Mg==caution Re Drowsiness 6)  Cipro 500 Mg Tabs (Ciprofloxacin Hcl) .Marland Kitchen.. 1 Tab By Mouth Two Times A Day X 7 Days 7)  Cyclobenzaprine Hcl 10 Mg  Tabs (Cyclobenzaprine Hcl) .Marland Kitchen.. 1 By Mouth 2 Times Daily As Needed For Back Pain  Allergies: 1)  ! Sulfa  Review of Systems      See HPI General:  Denies chills and fever. GI:  Denies abdominal pain, nausea, and vomiting. GU:  Denies dysuria, hematuria, incontinence, and urinary frequency. MS:  Complains of low back pain; denies loss of strength and muscle weakness. Neuro:  Denies falling down, numbness, poor balance, tremors, and weakness.  Physical Exam  General:  alert, well-developed, well-nourished, and well-hydrated.     Abdomen:  No suprapubic tendness Msk:  NO CVA tenderness Tight and mildly tendern lower lumbar parspinous muscles. Pain worsened with flexion, not extension. SLR neg bilaterally. FROM Psych:  normally interactive and slightly anxious.     Impression & Recommendations:  Problem # 1:  BACK PAIN (ICD-724.5) Assessment New Seems consistent with lumbar strain.  UA pos, which may just be incidental but will treat as if symptomatic. See patient instructions for details. Her updated medication list for this problem includes:    Cyclobenzaprine Hcl 10 Mg Tabs (Cyclobenzaprine hcl) .Marland Kitchen... 1 by mouth 2 times daily as needed for back pain  Complete Medication List: 1)  Calcium Citrate-vitamin D 315-200 Mg-unit Tabs (Calcium citrate-vitamin d) .... Take 1 tablet by mouth two times a day 2)  Boniva 150 Mg Tabs (Ibandronate sodium) .... Take one by mouth monthly 3)  Zoloft 25 Mg Tabs (Sertraline hcl) .... Take 1 and 1/2 tabs daily by mouth 4)  Aromasin 25 Mg Tabs (Exemestane) .... One daily 5)  Antivert 12.5 Mg Tabs (Meclizine hcl) .... Take 1/2 to 2 tabs every 8 hrs = 6.25 mg to 25 mg==caution re drowsiness 6)  Cipro 500 Mg Tabs (Ciprofloxacin hcl) .Marland Kitchen.. 1 tab by mouth two times a day x 7 days 7)  Cyclobenzaprine Hcl 10 Mg Tabs (Cyclobenzaprine hcl) .Marland Kitchen.. 1 by mouth  2 times daily as needed for back pain  Patient Instructions: 1)  Most patients (90%) with low back pain will improve with time ( 2-6 weeks). Keep active but avoid activities that are painful. Apply moist heat and/or ice to lower back several times a day.  Prescriptions: CYCLOBENZAPRINE HCL 10 MG  TABS (CYCLOBENZAPRINE HCL) 1 by mouth 2 times daily as needed for back pain  #20 x 0   Entered and Authorized by:   Ruthe Mannan MD   Signed by:   Ruthe Mannan MD on 05/08/2009   Method used:   Print then Give to Patient   RxID:   [id number] CIPRO 500 MG TABS (CIPROFLOXACIN HCL) 1 tab by mouth two times a day x 7 days  #14 x 0   Entered  and Authorized by:   Ruthe Mannan MD   Signed by:   Ruthe Mannan MD on 05/08/2009   Method used:   Print then Give to Patient   RxID:   [id number]   Current Allergies (reviewed today): ! SULFA  Laboratory Results   Urine Tests   Date/Time Reported: May 08, 2009 11:31 AM   Routine Urinalysis   Color: yellow Appearance: Clear Glucose: negative   (Normal Range: Negative) Bilirubin: negative   (Normal Range: Negative) Ketone: negative   (Normal Range: Negative) Spec. Gravity: 1.010   (Normal Range: 1.003-1.035) Blood: trace-lysed   (Normal Range: Negative) pH: 6.0   (Normal Range: 5.0-8.0) Protein: negative   (Normal Range: Negative) Urobilinogen: 0.2   (Normal Range: 0-1) Nitrite: positive   (Normal Range: Negative) Leukocyte Esterace: negative   (Normal Range: Negative)

## 2010-05-20 ENCOUNTER — Ambulatory Visit (INDEPENDENT_AMBULATORY_CARE_PROVIDER_SITE_OTHER): Payer: BC Managed Care – PPO | Admitting: Family Medicine

## 2010-05-20 ENCOUNTER — Encounter: Payer: Self-pay | Admitting: Family Medicine

## 2010-05-20 DIAGNOSIS — R300 Dysuria: Secondary | ICD-10-CM

## 2010-05-20 LAB — CONVERTED CEMR LAB
Bilirubin Urine: NEGATIVE
Glucose, Urine, Semiquant: NEGATIVE
Ketones, urine, test strip: NEGATIVE
Specific Gravity, Urine: <1.005
Urobilinogen, UA: 0.2
pH: 6

## 2010-05-26 NOTE — Assessment & Plan Note (Signed)
Summary: uti/alc   Vital Signs:  Patient profile:   55 year old female Height:      65.25 inches Weight:      176 pounds BMI:     29.17 Temp:     98.3 degrees F oral Pulse rate:   76 / minute Pulse rhythm:   regular BP sitting:   116 / 80  (left arm) Cuff size:   regular  Vitals Entered By: Selena Batten Dance CMA Duncan Dull) (May 20, 2010 4:05 PM) CC: ? UTI Comments Urgency,frequency,pressure, slight burning   History of Present Illness: CC: ?UTI  1 mo h/o dysuria, urgency (mainly), frequency.  No abd pain, n/v, f/c.  + R lower back pain.  Trying lots of water and cranberry juice to help control.  has had UTI in past, this feels similar.  Current Medications (verified): 1)  Calcium Citrate-Vitamin D 315-200 Mg-Unit  Tabs (Calcium Citrate-Vitamin D) .... Take 1 Tablet By Mouth Two Times A Day 2)  Zoloft 25 Mg Tabs (Sertraline Hcl) .... Take 1 and 1/2 Tabs Daily By Mouth 3)  Aromasin 25 Mg Tabs (Exemestane) .... One Daily  Allergies: 1)  ! Sulfa  Past History:  Past Medical History: Last updated: 08/27/2007 breast cancer 10/2004  Social History: no smokers at home  Review of Systems       per HPI  Physical Exam  General:  alert, well-developed, well-nourished, and well-hydrated.   Mouth:  Oral mucosa and oropharynx without lesions or exudates.  Teeth in good repair. Lungs:  Normal respiratory effort, chest expands symmetrically. Lungs are clear to auscultation, no crackles or wheezes. Heart:  Normal rate and regular rhythm. S1 and S2 normal without gallop, murmur, click, rub or other extra sounds. Abdomen:  Bowel sounds positive,abdomen soft and non-tender without masses, organomegaly or hernias noted.  no CVA tenderness, no suprapubic tendenress Pulses:  2+ rad pulses Extremities:  no pedal edema   Impression & Recommendations:  Problem # 1:  DYSURIA (ICD-788.1)  UA consistent with UTI.  UCx sent given duration and flank pain.  Treat with cipro 500mg  two times a  day x 7 days.  red flags to return discussed  The following medications were removed from the medication list:    Cipro 500 Mg Tabs (Ciprofloxacin hcl) .Marland Kitchen... 1 tab by mouth two times a day x 7 days Her updated medication list for this problem includes:    Ciprofloxacin Hcl 500 Mg Tabs (Ciprofloxacin hcl) .Marland Kitchen... Take one twice daily x 7 days  Orders: UA Dipstick W/ Micro (manual) (82956) Specimen Handling (99000) T-Culture, Urine ([id number])  Complete Medication List: 1)  Calcium Citrate-vitamin D 315-200 Mg-unit Tabs (Calcium citrate-vitamin d) .... Take 1 tablet by mouth two times a day 2)  Zoloft 25 Mg Tabs (Sertraline hcl) .... Take 1 and 1/2 tabs daily by mouth 3)  Aromasin 25 Mg Tabs (Exemestane) .... One daily 4)  Ciprofloxacin Hcl 500 Mg Tabs (Ciprofloxacin hcl) .... Take one twice daily x 7 days  Patient Instructions: 1)  looks like urinary infection.  treat with antibiotic x 7 days.   2)  push fluids and plenty of rest.  tylenol for discomfort as needed. 3)  return if fevers, nausea/vomiting, or worsening back pain or worsening instead of improving. 4)  Good to meet you today, call clinc iwith quesitons Prescriptions: CIPROFLOXACIN HCL 500 MG TABS (CIPROFLOXACIN HCL) take one twice daily x 7 days  #14 x 0   Entered and Authorized by:   Eustaquio Boyden  MD   Signed by:   Eustaquio Boyden  MD on 05/20/2010   Method used:   Electronically to        [facility]  [address] [id number] (retail)       [address]       [place], [place]  [address]       Ph: [phone] or [phone]       Fax: [phone]   RxID:   [id number]    Orders Added: 1)  UA Dipstick W/ Micro (manual) [81000] 2)  Est. Patient Level III [56433] 3)  Specimen Handling [99000] 4)  T-Culture, Urine [[id number]]    Current Allergies (reviewed today): ! SULFA  Laboratory Results   Urine Tests  Date/Time Received: May 20, 2010 4:06 PM  Date/Time Reported: May 20, 2010 4:06 PM    Routine Urinalysis   Color: lt. yellow Appearance: Hazy Glucose: negative   (Normal Range: Negative) Bilirubin: negative   (Normal Range: Negative) Ketone: negative   (Normal Range: Negative) Spec. Gravity: <1.005   (Normal Range: 1.003-1.035) Blood: trace-lysed   (Normal Range: Negative) pH: 6.0   (Normal Range: 5.0-8.0) Protein: negative   (Normal Range: Negative) Urobilinogen: 0.2   (Normal Range: 0-1) Nitrite: negative   (Normal Range: Negative) Leukocyte Esterace: trace   (Normal Range: Negative)  Urine Microscopic WBC/HPF: 5-10 RBC/HPF: 0-3 Bacteria/HPF: 1+ rods Mucous/HPF: no Epithelial/HPF: rare Crystals/HPF: no Casts/LPF: no Yeast/HPF: no    Comments: read by ...............Eustaquio Boyden  MD  May 20, 2010 4:28 PM  UCx sent

## 2010-07-29 ENCOUNTER — Other Ambulatory Visit: Payer: Self-pay | Admitting: Family Medicine

## 2010-08-31 ENCOUNTER — Other Ambulatory Visit: Payer: Self-pay | Admitting: Oncology

## 2010-08-31 ENCOUNTER — Encounter (HOSPITAL_BASED_OUTPATIENT_CLINIC_OR_DEPARTMENT_OTHER): Payer: BC Managed Care – PPO | Admitting: Oncology

## 2010-08-31 DIAGNOSIS — Z170 Estrogen receptor positive status [ER+]: Secondary | ICD-10-CM

## 2010-08-31 DIAGNOSIS — C50919 Malignant neoplasm of unspecified site of unspecified female breast: Secondary | ICD-10-CM

## 2010-08-31 LAB — COMPREHENSIVE METABOLIC PANEL
ALT: 26 U/L (ref 0–35)
AST: 23 U/L (ref 0–37)
BUN: 13 mg/dL (ref 6–23)
Creatinine, Ser: 0.61 mg/dL (ref 0.40–1.20)
Total Bilirubin: 0.4 mg/dL (ref 0.3–1.2)

## 2010-08-31 LAB — CBC WITH DIFFERENTIAL/PLATELET
BASO%: 0.6 % (ref 0.0–2.0)
Basophils Absolute: 0 10*3/uL (ref 0.0–0.1)
EOS%: 2.8 % (ref 0.0–7.0)
HCT: 41.7 % (ref 34.8–46.6)
HGB: 14.1 g/dL (ref 11.6–15.9)
LYMPH%: 36.4 % (ref 14.0–49.7)
MCH: 30.3 pg (ref 25.1–34.0)
MCHC: 33.9 g/dL (ref 31.5–36.0)
MCV: 89.5 fL (ref 79.5–101.0)
MONO%: 8.2 % (ref 0.0–14.0)
NEUT%: 52 % (ref 38.4–76.8)
Platelets: 177 10*3/uL (ref 145–400)
lymph#: 1.9 10*3/uL (ref 0.9–3.3)

## 2010-09-03 NOTE — Op Note (Signed)
Lisa Salinas, Lisa Salinas              ACCOUNT NO.:  [id number]   MEDICAL RECORD NO.:  [id number]          PATIENT TYPE:  AMB   LOCATION:  DSC                          FACILITY:  [facility]   PHYSICIAN:  Currie Paris, M.D.DATE OF BIRTH:  22-Apr-1955   DATE OF PROCEDURE:  07/28/2006  DATE OF DISCHARGE:                               OPERATIVE REPORT   PREOPERATIVE DIAGNOSIS:  Unneeded Port-A-Cath.   POSTOPERATIVE DIAGNOSES:  Unneeded Port-A-Cath.   OPERATION:  Removal of Port-A-Cath.   SURGEON:  Currie Paris, M.D.   ANESTHESIA:  Local.   CLINICAL HISTORY:  The patient has finished all of her treatments for  her breast cancer and wished to have her Port-A-Cath removed.   DESCRIPTION OF PROCEDURE:  The patient was seen in the preoperative area  and confirmed Port-A-Cath removal was the planned procedure.  She was  taken to the operating room and the area of the Port-A-Cath was prepped  and draped.  Time-out occurred.   A combination of 1% Xylocaine with epi and 0.5% plain Marcaine was used  for local and infiltrated over the old scar and around the Port-A-Cath.  Once excellent anesthesia had been obtained, the skin incision was made,  using the old scar.  The port was identified and the pocket around it  opened.  The port was manipulated out into the wound.  The tract was  sutured with a 3-0 Vicryl and then the Port-A-Cath tubing backed out and  the suture tied down to prevent backbleeding.   Everything appeared to be dry.  Incision was closed with 3-0 Vicryl  subcu, 4-0 Monocryl subcuticular, and Dermabond on the skin.   The patient tolerated procedure well with no complications.      Currie Paris, M.D.  Electronically Signed     CJS/MEDQ  D:  07/28/2006  T:  07/28/2006  Job:  [id number]

## 2010-09-03 NOTE — Op Note (Signed)
Lisa Salinas, Lisa Salinas              ACCOUNT NO.:  [id number]   MEDICAL RECORD NO.:  [id number]          PATIENT TYPE:  AMB   LOCATION:  DSC                          FACILITY:  [facility]   PHYSICIAN:  Currie Paris, M.D.DATE OF BIRTH:  Nov 23, 1955   DATE OF PROCEDURE:  04/27/2005  DATE OF DISCHARGE:                                 OPERATIVE REPORT   OFFICE MEDICAL RECORD NUMBER [id number] .   PREOPERATIVE DIAGNOSIS:  Cancer, right breast upper inner quadrant, status  post neoadjuvant chemotherapy.   POSTOPERATIVE DIAGNOSIS:  Cancer, right breast upper inner quadrant, status  post neoadjuvant chemotherapy.   OPERATION:  Right partial mastectomy with blue dye injection and axillary  sentinel lymph node biopsy (three nodes removed).   SURGEON:  Currie Paris, M.D.   ANESTHESIA:  General.   CLINICAL HISTORY:  This is a 55 year old lady who presented several months  ago with a fairly bulky upper inner quadrant right breast mass.  She has  undergone preoperative chemotherapy with at least a good clinical response  in terms of the area that was palpably abnormal getting significantly  smaller.  Her MRI, however, looked somewhat large still.  We went over at  length with her any issues and she wished to proceed to lumpectomy with the  recommendation that we would still need to do a fairly large lumpectomy and  that she might end up having to have a mastectomy.  We also plan to do a  lymph node sampling of.   DESCRIPTION OF PROCEDURE:  The patient was seen in the holding area, and she  had no further questions.  The right breast was identified and marked both  by the patient and myself as the operative site.  She is then injected with  her radioisotope.   The patient then taken to the operating room after satisfactory general  anesthesia had been obtained, we did a time-out.  The left breast was then  prepped with some alcohol and injected with methylene blue subareolarly.   We did  a full prep and drape.  The axillary hot area was identified and a  transverse incision made.  Subcutaneous tissue was divided and the axilla  proper entered.  A blue lymphatic was almost immediately identified and  traced to a fairly large bright blue lymph node, which had counts ex vivo of  about 700.  This was removed primarily with the cautery, although some clips  were placed on some small vessels.   There was still another hot area noticed with the Neoprobe all a little  dissection, and palpation revealed a soft about 1 cm node that seemed to  have counts.  This was excised and had counts of about 30 with backgrounds  really being close to 0.  This was sent as a second lymph node.  There was  still another hot area with counts of around 30, and further dissection  showed a lymph node that had just a little touch of blue coming into it, and  this was likewise excised and had again somewhat low counts but definitely  counts.  With removal of this lymph node, there was no palpable adenopathy, no blue  nodes noticed, and counts down to 0-5 in the axilla.  Everything appeared to  be dry, so I temporarily placed a pack here.  Attention was turned back to  the palpable mass in the upper inner quadrant of the right breast.  I  outlined and made a fairly long incision, doing an ellipse to get a good  skin margin.  I raised skin flaps medially, superiorly and inferiorly and  then divided the breast tissue down to the chest wall in all three  directions.  I then came under the area of the tumor using cautery and took  the fascia off of the pectoralis muscle.  Incision was then completed by  dividing the subcutaneous tissues and breast tissue laterally and  subareolarly.  We had a fairly large lumpectomy, and I that felt I was  completely around the tumor and in no place had divided any tumor.   I spent several minutes here making sure everything was dry.  I injected  some Marcaine both in  the muscle and subcu tissues to try to help with  postoperative pain relief.  Once everything was dry, I placed two small  packs and turned my attention back to the axilla.  At this point Dr. Almyra Free  reported that the three nodes were all negative.  I injected some Marcaine  here and closed this incision in layers with 3-0 Vicryl and 4-0 Monocryl  subcuticular plus Dermabond.   Attention was turned back to the lumpectomy site.  It had remained  completely dry while we were working on the axilla.  I then closed this  after irrigating with 3-0 Vicryl and staples.  Because of the large cavity  that we were leaving behind, I was concerned that absorbable suture might  not give enough support in case a fair amount of fluid built up here.   Dr. Almyra Free reported that the margins appeared to be negative and that there  appeared to be a lot of necrotic tumor in the specimen.  With that report,  this concluded the case.  Sterile dressings were applied.  The patient  tolerated the procedure well.  There were no operative complications.  All  counts were correct.      Currie Paris, M.D.  Electronically Signed     CJS/MEDQ  D:  04/27/2005  T:  04/27/2005  Job:  [id number]   cc:   Valentino Hue. Magrinat, M.D.  Fax: [phone]   Gretta Cool, M.D.  Fax: [phone]

## 2010-09-03 NOTE — Op Note (Signed)
NAMEANTWONETTE, Lisa Salinas              ACCOUNT NO.:  [id number]   MEDICAL RECORD NO.:  [id number]          PATIENT TYPE:  AMB   LOCATION:  DSC                          FACILITY:  [facility]   PHYSICIAN:  Currie Paris, M.D.DATE OF BIRTH:  [birth year]   DATE OF PROCEDURE:  02/09/2005  DATE OF DISCHARGE:                                 OPERATIVE REPORT   OFFICE MEDICAL RECORD NUMBER:  [id number]   PREOPERATIVE DIAGNOSIS:  Right breast cancer, inadequate venous access for  chemotherapy.   POSTOPERATIVE DIAGNOSIS:  Right breast cancer, inadequate venous access for  chemotherapy.   OPERATION:  Placement of Port-A-Cath.   SURGEON:  Currie Paris, M.D.   ANESTHESIA:  MAC.   CLINICAL HISTORY:  This patient is A 49-year lady undergoing neoadjuvant  therapy for a right breast cancer.  She has not done well with hormonal  intervention and standard chemotherapy is to begin soon and better IV access  was needed.   DESCRIPTION OF PROCEDURE:  The patient was seen in the holding area.  I  reviewed the plans for the procedure including indication, risks, and  complications.  All questions were answered and she wished to proceed.   She was taken to the operating room and given IV sedation.  The upper chest  and lower neck were prepped and draped as a sterile field.  The time-out  occurred.   I infiltrated 1% Xylocaine under the left infraclavicular area.  On the  initial attempt to cannulate the vein, I did not get any blood back, so I  tried just a half a centimeter more medially and got good blood return.  A  guidewire was threaded and under fluoroscopic control, was seen to be in the  superior vena cava/right atrial area.  Additional local was infiltrated over the skin over the anterior chest wall  and breast and a transverse incision made.  Using cautery, a pocket was  fashioned for the reservoir.  Local was infiltrated between here and the  guidewire entry site.   The Port-A-Cath  tubing was pulled out through a subcutaneous tunnel.  The  guidewire tract was dilated once with the dilator peel-away sheath and this  went easily.  The dilator and guidewire were removed and the catheter  threaded to approximately 23 cm.  It aspirated and irrigated easily.  The  patient was placed from Trendelenburg to flat so we could get a better  visualization with fluoro and using the C-arm, I backed this catheter up  until it appeared to be near the junction of the superior vena cava-right  atrium, but in the superior vena cava.  It again aspirated and irrigated  easily.   The reservoir was flushed, attached and the locking mechanism engaged.  It  aspirated and irrigated easily.   The reservoir was sutured to the fascia with 2 sutures of Vicryl using the  suture rings on the reservoir.  A final check was made with fluoro and we  appeared to have good positioning and no kinks.   I aspirated 1 more time and flushed with 10 mL of dilute  heparin followed by  5 mL of concentrated heparin.   The incision was then closed with 3-0 Vicryl in the subcu, 4-0 Monocryl and  Dermabond for subcuticular and skin, respectively.   The patient tolerated the procedure well.  There were no complications and  all counts were correct.      Currie Paris, M.D.  Electronically Signed     CJS/MEDQ  D:  02/09/2005  T:  02/09/2005  Job:  [id number]

## 2010-09-09 ENCOUNTER — Encounter (HOSPITAL_BASED_OUTPATIENT_CLINIC_OR_DEPARTMENT_OTHER): Payer: BC Managed Care – PPO | Admitting: Oncology

## 2010-09-09 DIAGNOSIS — Z170 Estrogen receptor positive status [ER+]: Secondary | ICD-10-CM

## 2010-09-09 DIAGNOSIS — C50219 Malignant neoplasm of upper-inner quadrant of unspecified female breast: Secondary | ICD-10-CM

## 2010-11-01 ENCOUNTER — Other Ambulatory Visit: Payer: Self-pay | Admitting: Gynecology

## 2010-12-08 ENCOUNTER — Other Ambulatory Visit: Payer: Self-pay | Admitting: Gynecology

## 2010-12-08 DIAGNOSIS — Z853 Personal history of malignant neoplasm of breast: Secondary | ICD-10-CM

## 2010-12-16 ENCOUNTER — Ambulatory Visit
Admission: RE | Admit: 2010-12-16 | Discharge: 2010-12-16 | Disposition: A | Discharge status: Home / Self Care | Payer: BC Managed Care – PPO | Source: Ambulatory Visit | Attending: Gynecology | Admitting: Gynecology

## 2010-12-16 DIAGNOSIS — Z853 Personal history of malignant neoplasm of breast: Secondary | ICD-10-CM

## 2011-01-20 ENCOUNTER — Encounter: Payer: Self-pay | Admitting: Family Medicine

## 2011-01-20 ENCOUNTER — Ambulatory Visit (INDEPENDENT_AMBULATORY_CARE_PROVIDER_SITE_OTHER): Payer: BC Managed Care – PPO | Admitting: Family Medicine

## 2011-01-20 VITALS — BP 114/72 | HR 82 | Temp 98.4°F | Ht 65.0 in | Wt 177.2 lb

## 2011-01-20 DIAGNOSIS — J329 Chronic sinusitis, unspecified: Secondary | ICD-10-CM

## 2011-01-20 DIAGNOSIS — B9689 Other specified bacterial agents as the cause of diseases classified elsewhere: Secondary | ICD-10-CM | POA: Insufficient documentation

## 2011-01-20 MED ORDER — GUAIFENESIN-CODEINE 100-10 MG/5ML PO SYRP: 5.0000 mL | ORAL_SOLUTION | Freq: Two times a day (BID) | ORAL | Status: AC | PRN

## 2011-01-20 MED ORDER — AMOXICILLIN-POT CLAVULANATE 875-125 MG PO TABS: 1.0000 | ORAL_TABLET | Freq: Two times a day (BID) | ORAL | Status: AC

## 2011-01-20 NOTE — Progress Notes (Signed)
  Subjective:    Patient ID: Lisa Salinas, female    DOB: 19-Apr-1955, 55 y.o.   MRN: [id number]  HPI CC: ST?  4d h/o deep cough that sounds wet but unable to bring anything up, ST, aches, pain in chest with cough.  + PNDrip.  RN.  Thought getting better, then last night started having chills.  So far has tried night time nyquil as well as dayquil.  Took ibuprofen.  Drinking OJ.  Using lozenges.  No documented fever.  No abd pain, n/v/d, rashes.  Husband has had cough.  No smokers at home.  No dx asthma/copd.  Has upcoming trip in 1 1/2 wks.    Review of Systems Per HPI    Objective:   Physical Exam  Vitals reviewed. Constitutional: She appears well-developed and well-nourished. No distress.       cough  HENT:  Head: Normocephalic and atraumatic.  Right Ear: Hearing, tympanic membrane, external ear and ear canal normal.  Left Ear: Hearing, tympanic membrane, external ear and ear canal normal.  Nose: Mucosal edema present. No rhinorrhea. Right sinus exhibits no maxillary sinus tenderness and no frontal sinus tenderness. Left sinus exhibits no maxillary sinus tenderness and no frontal sinus tenderness.  Mouth/Throat: Uvula is midline, oropharynx is clear and moist and mucous membranes are normal. No oropharyngeal exudate, posterior oropharyngeal edema, posterior oropharyngeal erythema or tonsillar abscesses.  Eyes: Conjunctivae and EOM are normal. Pupils are equal, round, and reactive to light. No scleral icterus.  Neck: Normal range of motion. Neck supple. No JVD present. No thyromegaly present.  Cardiovascular: Normal rate, regular rhythm, normal heart sounds and intact distal pulses.   No murmur heard. Pulmonary/Chest: Effort normal and breath sounds normal. No respiratory distress. She has no wheezes. She has no rales.  Lymphadenopathy:    She has no cervical adenopathy.  Skin: Skin is warm and dry. No rash noted.          Assessment & Plan:

## 2011-01-20 NOTE — Patient Instructions (Signed)
I think you have sinus infection, likely viral. Take medicine as prescribed: cheratussin for cough at night. Push fluids and plenty of rest. Nasal saline irrigation or neti pot to help drain sinuses. May use simple mucinex (or guaifenesin) with plenty of fluid to help mobilize mucous. if fever >101.5, or worsening cough, fill antibiotic.

## 2011-01-20 NOTE — Assessment & Plan Note (Signed)
4d hx sinus sxs.  Did recently deteriorate after improving, but would like to give more time.   As weekend, provided with abx script in case not improving as expected or any worsening.   In interim, try cheratussin for cough at night, mucinex during day, supportive care. Discussed likely viral this early on, abx wouldn't help (unless deterioration)

## 2011-01-25 ENCOUNTER — Ambulatory Visit (INDEPENDENT_AMBULATORY_CARE_PROVIDER_SITE_OTHER): Payer: BC Managed Care – PPO | Admitting: Family Medicine

## 2011-01-25 ENCOUNTER — Encounter: Payer: Self-pay | Admitting: Family Medicine

## 2011-01-25 DIAGNOSIS — J329 Chronic sinusitis, unspecified: Secondary | ICD-10-CM

## 2011-01-25 NOTE — Progress Notes (Signed)
  Subjective:    Patient ID: Lisa Salinas, female    DOB: 09-15-1955, 55 y.o.   MRN: [id number]  HPI Cc: f/u, not all better  Seen 10/4 with 4d h/o sinus sxs (sxs started 01/17/2011).  Treated with WASP script given recent deterioration, augmentin x 10 days.  Started last night.  Taking cheratussin and nasal saline spray and ibuprofen, helping.  Last 2 days right eye was red.  No eye pain, no vision changes, no blurry vision or photophobia.  Used visine.  Filled antibiotic yesterday, started last night.  Having coughing spells with significant pain occipital region when coughing.  Using vicks vaporizer for air moisture.  Sunday morning nose bled as well.  AM drainage down throat greenish mucous. Not able to get much mucous out when blowing nose.  Tried to go to work yesterday, but sent home because red eye.  Stayed home today.  No fevers/chills, abd pain, no post tussive emesis, sweating.  Review of Systems Per HPI    Objective:   Physical Exam  Nursing note and vitals reviewed. Constitutional: She appears well-developed and well-nourished. No distress.  HENT:  Head: Normocephalic and atraumatic.  Right Ear: Hearing, tympanic membrane, external ear and ear canal normal.  Left Ear: Hearing, tympanic membrane, external ear and ear canal normal.  Nose: Rhinorrhea present. No mucosal edema. Right sinus exhibits no maxillary sinus tenderness and no frontal sinus tenderness. Left sinus exhibits no maxillary sinus tenderness and no frontal sinus tenderness.  Mouth/Throat: Uvula is midline, oropharynx is clear and moist and mucous membranes are normal. No oropharyngeal exudate, posterior oropharyngeal edema, posterior oropharyngeal erythema or tonsillar abscesses.  Eyes: EOM are normal. Pupils are equal, round, and reactive to light. Right conjunctiva is injected. No scleral icterus.       Bulbar conjunctivitis, limbic sparing  Neck: Normal range of motion. Neck supple.  Cardiovascular:  Normal rate, regular rhythm, normal heart sounds and intact distal pulses.   No murmur heard. Pulmonary/Chest: Effort normal and breath sounds normal. No respiratory distress. She has no wheezes. She has no rales.       Slight crackles bibasilarly  Musculoskeletal: She exhibits no edema.  Lymphadenopathy:    She has no cervical adenopathy.  Skin: Skin is warm and dry. No rash noted.  Psychiatric: She has a normal mood and affect.      Assessment & Plan:

## 2011-01-25 NOTE — Patient Instructions (Signed)
I think we need to give this a little more time. Continue everything you're doing up to now.   May try to do ibuprofen every 6 hours for next few days. Continue cheratussin and nasal saline spray. Continue augmentin - should kick in by tomorrow. If fever >101.5, worsening cough or shortness of breath, let me know.

## 2011-01-25 NOTE — Assessment & Plan Note (Signed)
continued sxs. No indication of lung infection as good O2 sat and lungs overall clear to exam. Reassured, needs more time. Red flags to return discussed. Treat presumed viral conjunctivitis with warm compresses.

## 2011-01-31 ENCOUNTER — Telehealth: Payer: Self-pay | Admitting: *Deleted

## 2011-01-31 MED ORDER — FLUCONAZOLE 150 MG PO TABS: 150.0000 mg | ORAL_TABLET | Freq: Once | ORAL | Status: AC

## 2011-01-31 NOTE — Telephone Encounter (Signed)
Pt has been taking augmentin and now she has a yeast infection.  She is asking that diflucan be called to [facility].  She's leaving on a cruise on Wednesday.

## 2011-01-31 NOTE — Telephone Encounter (Signed)
Rx sent as below.

## 2011-08-17 ENCOUNTER — Other Ambulatory Visit: Payer: Self-pay

## 2012-02-16 ENCOUNTER — Other Ambulatory Visit: Payer: Self-pay | Admitting: Gynecology

## 2012-02-16 DIAGNOSIS — Z853 Personal history of malignant neoplasm of breast: Secondary | ICD-10-CM

## 2012-04-27 ENCOUNTER — Ambulatory Visit
Admission: RE | Admit: 2012-04-27 | Discharge: 2012-04-27 | Disposition: A | Discharge status: Home / Self Care | Payer: BC Managed Care – PPO | Source: Ambulatory Visit | Attending: Gynecology | Admitting: Gynecology

## 2012-04-27 DIAGNOSIS — Z853 Personal history of malignant neoplasm of breast: Secondary | ICD-10-CM

## 2012-05-23 ENCOUNTER — Other Ambulatory Visit: Payer: Self-pay | Admitting: Gynecology

## 2012-05-23 DIAGNOSIS — M8580 Other specified disorders of bone density and structure, unspecified site: Secondary | ICD-10-CM

## 2012-05-31 ENCOUNTER — Other Ambulatory Visit: Payer: BC Managed Care – PPO

## 2012-06-06 ENCOUNTER — Ambulatory Visit
Admission: RE | Admit: 2012-06-06 | Discharge: 2012-06-06 | Disposition: A | Discharge status: Home / Self Care | Payer: BC Managed Care – PPO | Source: Ambulatory Visit | Attending: Gynecology | Admitting: Gynecology

## 2012-06-06 DIAGNOSIS — M8580 Other specified disorders of bone density and structure, unspecified site: Secondary | ICD-10-CM

## 2013-05-22 ENCOUNTER — Other Ambulatory Visit: Payer: Self-pay | Admitting: Gynecology

## 2013-05-22 DIAGNOSIS — Z9889 Other specified postprocedural states: Secondary | ICD-10-CM

## 2013-05-27 ENCOUNTER — Ambulatory Visit
Admission: RE | Admit: 2013-05-27 | Discharge: 2013-05-27 | Disposition: A | Discharge status: Home / Self Care | Payer: Self-pay | Source: Ambulatory Visit | Attending: Gynecology | Admitting: Gynecology

## 2013-05-27 ENCOUNTER — Other Ambulatory Visit: Payer: Self-pay | Admitting: Gynecology

## 2013-05-27 DIAGNOSIS — Z9889 Other specified postprocedural states: Secondary | ICD-10-CM

## 2014-01-23 ENCOUNTER — Other Ambulatory Visit: Payer: Self-pay

## 2014-02-12 ENCOUNTER — Ambulatory Visit (INDEPENDENT_AMBULATORY_CARE_PROVIDER_SITE_OTHER): Payer: BC Managed Care – PPO | Admitting: Family Medicine

## 2014-02-12 ENCOUNTER — Encounter: Payer: Self-pay | Admitting: Family Medicine

## 2014-02-12 VITALS — BP 100/74 | HR 79 | Temp 98.1°F | Ht 65.0 in | Wt 174.5 lb

## 2014-02-12 DIAGNOSIS — J320 Chronic maxillary sinusitis: Secondary | ICD-10-CM

## 2014-02-12 MED ORDER — AMOXICILLIN-POT CLAVULANATE 875-125 MG PO TABS: 1.0000 | ORAL_TABLET | Freq: Two times a day (BID) | ORAL | Status: DC

## 2014-02-12 MED ORDER — FLUCONAZOLE 150 MG PO TABS: 150.0000 mg | ORAL_TABLET | Freq: Once | ORAL | Status: DC

## 2014-02-12 NOTE — Progress Notes (Signed)
Pre visit review using our clinic review tool, if applicable. No additional management support is needed unless otherwise documented below in the visit note. 

## 2014-02-12 NOTE — Progress Notes (Signed)
   Dr. Frederico Hamman T. Kagan Hietpas, MD, [facility] [place], [address] Phone: [phone] Fax: [phone]  02/12/2014  Patient: Lisa Salinas, MRN: [id number], DOB: March 23, 1956, 58 y.o.  Primary Physician:  Eliezer Lofts, MD  Chief Complaint: Sinusitis  Subjective:   This 58 y.o. female patient presents with runny nose, sneezing, cough, sore throat, malaise and minimal / low-grade fever for > 1 week. Now the primary complaint has become sinus pressure and anterior face.    Drainage eand in the mornings it is really colred and R sided teeth are hurting. 2nd week of feeling bad, ST, stuffy nose.   The patent denies sore throat as the primary complaint. Denies sthortness of breath/wheezing, high fever, chest pain, significant myalgia, otalgia, abdominal pain, changes in bowel or bladder.  PMH, PHS, Allergies, Problem List, Medications, Family History, and Social History have all been reviewed.  ROS as above, eating and drinking - tolerating PO. Urinating normally. No excessive vomitting or diarrhea. O/w as above.  Objective:   Blood pressure 100/74, pulse 79, temperature 98.1 F (36.7 C), temperature source Oral, height 5\' 5"  (1.651 m), weight 174 lb 8 oz (79.153 kg).  GEN: WDWN, Non-toxic, Atraumatic, normocephalic. A and O x 3. HEENT: Oropharynx clear without exudate, MMM, no significant LAD, mild rhinnorhea Sinuses: Right Frontal, ethmoid, and maxillary: Tender max Left Frontal, Ethmoid, and maxillary: nonTender Ears: TM clear, COL visualized with good landmarks CV: RRR, no m/g/r. Pulm: CTA B, no wheezes, rhonchi, or crackles, normal respiratory effort. EXT: no c/c/e Psych: well oriented, neither depressed nor anxious in appearance  Assessment and Plan:   Right maxillary sinusitis    Acute sinusitis: ABX as below.   Reviewed symptomatic care as well as ABX in this case.    Follow-up: No Follow-up on file.  New  Prescriptions   AMOXICILLIN-CLAVULANATE (AUGMENTIN) 875-125 MG PER TABLET    Take 1 tablet by mouth 2 (two) times daily.   FLUCONAZOLE (DIFLUCAN) 150 MG TABLET    Take 1 tablet (150 mg total) by mouth once.   No orders of the defined types were placed in this encounter.    Signed,  Maud Deed. Candon Caras, MD  Patient's Medications  New Prescriptions   AMOXICILLIN-CLAVULANATE (AUGMENTIN) 875-125 MG PER TABLET    Take 1 tablet by mouth 2 (two) times daily.   FLUCONAZOLE (DIFLUCAN) 150 MG TABLET    Take 1 tablet (150 mg total) by mouth once.  Previous Medications   CALCIUM CITRATE-VITAMIN D (CITRACAL+D) 315-200 MG-UNIT PER TABLET    Take 1 tablet by mouth 2 (two) times daily.    Modified Medications   No medications on file  Discontinued Medications   No medications on file

## 2014-03-26 ENCOUNTER — Ambulatory Visit (INDEPENDENT_AMBULATORY_CARE_PROVIDER_SITE_OTHER): Payer: BC Managed Care – PPO | Admitting: Internal Medicine

## 2014-03-26 ENCOUNTER — Encounter: Payer: Self-pay | Admitting: Internal Medicine

## 2014-03-26 VITALS — BP 110/64 | HR 86 | Temp 98.1°F | Wt 174.0 lb

## 2014-03-26 DIAGNOSIS — J069 Acute upper respiratory infection, unspecified: Secondary | ICD-10-CM

## 2014-03-26 MED ORDER — AZITHROMYCIN 250 MG PO TABS: ORAL_TABLET | ORAL | Status: DC

## 2014-03-26 MED ORDER — HYDROCODONE-HOMATROPINE 5-1.5 MG/5ML PO SYRP: 5.0000 mL | ORAL_SOLUTION | Freq: Three times a day (TID) | ORAL | Status: DC | PRN

## 2014-03-26 NOTE — Progress Notes (Signed)
Pre visit review using our clinic review tool, if applicable. No additional management support is needed unless otherwise documented below in the visit note. 

## 2014-03-26 NOTE — Patient Instructions (Signed)
Cough, Adult  A cough is a reflex that helps clear your throat and airways. It can help heal the body or may be a reaction to an irritated airway. A cough may only last 2 or 3 weeks (acute) or may last more than 8 weeks (chronic).  CAUSES Acute cough:  Viral or bacterial infections. Chronic cough:  Infections.  Allergies.  Asthma.  Post-nasal drip.  Smoking.  Heartburn or acid reflux.  Some medicines.  Chronic lung problems (COPD).  Cancer. SYMPTOMS   Cough.  Fever.  Chest pain.  Increased breathing rate.  High-pitched whistling sound when breathing (wheezing).  Colored mucus that you cough up (sputum). TREATMENT   A bacterial cough may be treated with antibiotic medicine.  A viral cough must run its course and will not respond to antibiotics.  Your caregiver may recommend other treatments if you have a chronic cough. HOME CARE INSTRUCTIONS   Only take over-the-counter or prescription medicines for pain, discomfort, or fever as directed by your caregiver. Use cough suppressants only as directed by your caregiver.  Use a cold steam vaporizer or humidifier in your bedroom or home to help loosen secretions.  Sleep in a semi-upright position if your cough is worse at night.  Rest as needed.  Stop smoking if you smoke. SEEK IMMEDIATE MEDICAL CARE IF:   You have pus in your sputum.  Your cough starts to worsen.  You cannot control your cough with suppressants and are losing sleep.  You begin coughing up blood.  You have difficulty breathing.  You develop pain which is getting worse or is uncontrolled with medicine.  You have a fever. MAKE SURE YOU:   Understand these instructions.  Will watch your condition.  Will get help right away if you are not doing well or get worse. Document Released: 10/01/2010 Document Revised: 06/27/2011 Document Reviewed: 10/01/2010 Exit[employer] Patient Information 2015 [employer]. This information is not intended  to replace advice given to you by your health care provider. Make sure you discuss any questions you have with your health care provider.  

## 2014-03-26 NOTE — Progress Notes (Signed)
HPI  Pt presents to the clinic today with c/o cough and chest congestion. She reports this started  5 days ago. The cough is productive of thick yellow mucous. She has had associated sore throat and post nasal drip. She denies fever, chills or body aches. She has tried Delsym without much relief. She has no history of allergies or breathing problems. She has had sick contacts.  Review of Systems      Past Medical History  Diagnosis Date  . Anxiety state, unspecified   . Dizziness and giddiness   . Malignant neoplasm of breast (female), unspecified site 7/06    No family history on file.  History   Social History  . Marital Status: Married    Spouse Name: N/A    Number of Children: N/A  . Years of Education: N/A   Occupational History  . Not on file.   Social History Main Topics  . Smoking status: Never Smoker   . Smokeless tobacco: Never Used  . Alcohol Use: No  . Drug Use: No  . Sexual Activity: Not on file   Other Topics Concern  . Not on file   Social History Narrative    Allergies  Allergen Reactions  . Sulfonamide Derivatives     REACTION: [place]     Constitutional: Denies headache, fatigue and fever.  HEENT:  Positive runny nose, sore throat. Denies eye redness, eye pain, pressure behind the eyes, facial pain, nasal congestion, ear pain, ringing in the ears, wax buildup, or bloody nose. Respiratory: Positive cough. Denies difficulty breathing or shortness of breath.  Cardiovascular: Denies chest pain, chest tightness, palpitations or swelling in the hands or feet.   No other specific complaints in a complete review of systems (except as listed in HPI above).  Objective:   BP 110/64 mmHg  Pulse 86  Temp(Src) 98.1 F (36.7 C) (Oral)  Wt 174 lb (78.926 kg)  SpO2 98%   Wt Readings from Last 3 Encounters:  03/26/14 174 lb (78.926 kg)  02/12/14 174 lb 8 oz (79.153 kg)  01/25/11 180 lb 4 oz (81.761 kg)     General: Appears her stated age, ill  appearing in NAD. HEENT: Head: normal shape and size;  Ears: Tm's pink but intact, normal light reflex; Nose: mucosa pink and moist, septum midline; Throat/Mouth: + PND. Teeth present, mucosa erythematous and moist, no exudate noted, no lesions or ulcerations noted. No adenopathy noted.  Cardiovascular: Normal rate and rhythm. S1,S2 noted.  No murmur, rubs or gallops noted.  Pulmonary/Chest: Normal effort and positive vesicular breath sounds. No respiratory distress. No wheezes, rales or ronchi noted.      Assessment & Plan:   Upper Respiratory Infection:  Get some rest and drink plenty of water Do salt water gargles for the sore throat eRx for Azithromax x 5 days eRx for Hycodan cough syrup  RTC as needed or if symptoms persist.

## 2014-05-08 ENCOUNTER — Encounter: Payer: Self-pay | Admitting: Family Medicine

## 2014-06-16 ENCOUNTER — Other Ambulatory Visit: Payer: Self-pay

## 2014-06-16 DIAGNOSIS — Z1231 Encounter for screening mammogram for malignant neoplasm of breast: Secondary | ICD-10-CM

## 2014-06-24 ENCOUNTER — Other Ambulatory Visit: Payer: Self-pay

## 2014-06-24 ENCOUNTER — Ambulatory Visit
Admission: RE | Admit: 2014-06-24 | Discharge: 2014-06-24 | Disposition: A | Discharge status: Home / Self Care | Payer: BC Managed Care – PPO | Source: Ambulatory Visit

## 2014-06-24 DIAGNOSIS — Z1231 Encounter for screening mammogram for malignant neoplasm of breast: Secondary | ICD-10-CM

## 2014-06-25 ENCOUNTER — Other Ambulatory Visit: Payer: Self-pay | Admitting: Family Medicine

## 2014-06-25 DIAGNOSIS — R928 Other abnormal and inconclusive findings on diagnostic imaging of breast: Secondary | ICD-10-CM

## 2014-06-30 ENCOUNTER — Ambulatory Visit
Admission: RE | Admit: 2014-06-30 | Discharge: 2014-06-30 | Disposition: A | Discharge status: Home / Self Care | Payer: BC Managed Care – PPO | Source: Ambulatory Visit | Attending: Family Medicine | Admitting: Family Medicine

## 2014-06-30 DIAGNOSIS — R928 Other abnormal and inconclusive findings on diagnostic imaging of breast: Secondary | ICD-10-CM

## 2014-07-18 ENCOUNTER — Encounter: Payer: Self-pay | Admitting: Internal Medicine

## 2014-07-18 ENCOUNTER — Ambulatory Visit (INDEPENDENT_AMBULATORY_CARE_PROVIDER_SITE_OTHER): Payer: BC Managed Care – PPO | Admitting: Internal Medicine

## 2014-07-18 VITALS — BP 110/72 | HR 79 | Temp 98.2°F | Ht 64.5 in | Wt 171.0 lb

## 2014-07-18 DIAGNOSIS — J4520 Mild intermittent asthma, uncomplicated: Secondary | ICD-10-CM | POA: Diagnosis not present

## 2014-07-18 DIAGNOSIS — J209 Acute bronchitis, unspecified: Secondary | ICD-10-CM | POA: Diagnosis not present

## 2014-07-18 DIAGNOSIS — J310 Chronic rhinitis: Secondary | ICD-10-CM

## 2014-07-18 MED ORDER — PREDNISONE 20 MG PO TABS: 20.0000 mg | ORAL_TABLET | Freq: Two times a day (BID) | ORAL | Status: DC

## 2014-07-18 MED ORDER — HYDROCODONE-HOMATROPINE 5-1.5 MG/5ML PO SYRP: 5.0000 mL | ORAL_SOLUTION | Freq: Four times a day (QID) | ORAL | Status: DC | PRN

## 2014-07-18 MED ORDER — DOXYCYCLINE HYCLATE 100 MG PO TABS: 100.0000 mg | ORAL_TABLET | Freq: Two times a day (BID) | ORAL | Status: DC

## 2014-07-18 NOTE — Patient Instructions (Signed)
Symbicort 80/4.5 mg two inhalations every 12 hours; gargle and spit after use (Lot:[id number];Exp:4/17. Plain Mucinex (NOT D) for thick secretions ;force NON dairy fluids .   Nasal cleansing in the shower as discussed with lather of mild shampoo.After 10 seconds wash off lather while  exhaling through nostrils. Make sure that all residual soap is removed to prevent irritation.  Flonase OR Nasacort AQ 1 spray in each nostril twice a day as needed. Use the "crossover" technique into opposite nostril spraying toward opposite ear @ 45 degree angle, not straight up into nostril.  Plain Allegra (NOT D )  160 daily , Loratidine 10 mg , OR Zyrtec 10 mg @ bedtime  as needed for itchy eyes & sneezing. Fill the  prescription for Prednisone it there is not dramatic improvement in the next 48 hours.

## 2014-07-18 NOTE — Progress Notes (Signed)
Pre visit review using our clinic review tool, if applicable. No additional management support is needed unless otherwise documented below in the visit note. 

## 2014-07-18 NOTE — Progress Notes (Signed)
   Subjective:    Patient ID: Lisa Salinas, female    DOB: 04/06/1956, 59 y.o.   MRN: [id number]  HPI  Her symptoms started 2 weeks ago as sore throat & paroxysmal cough. The cough can cause occipital headache. She describes slightly watery eyes; slight postnasal drainage; and sore throat from the cough. She had self-limited epistaxis once recently. Last night she thought she heard some wheezing.  She denies any significant reflux symptoms  She has no history of asthma and does not smoke.  Review of Systems  She denies significant itchy eyes.She has no fever, chills, or sweats. She denies frontal headache, nasal purulence, facial pain, dental pain, otic discharge.      Objective:   Physical Exam  General appearance:Adequately nourished; no acute distress or increased work of breathing is present.    Lymphatic: No  lymphadenopathy about the head, neck, or axilla .  Eyes: No conjunctival inflammation or lid edema is present. There is no scleral icterus.  Ears:  External ear exam shows no significant lesions or deformities.  Otoscopic examination reveals clear canals, tympanic membranes are intact bilaterally without bulging, retraction, inflammation or discharge.  Nose:  External nasal examination shows no deformity or inflammation. There is erythema of the nasal mucosa. No septal dislocation or deviation.No obstruction to airflow.   Oral exam: Dental hygiene is good; lips and gums are healthy appearing.There is no oropharyngeal erythema or exudate .  Neck:  No deformities, thyromegaly, masses, or tenderness noted.   Supple with full range of motion without pain.   Heart:  Normal rate and regular rhythm. S1 and S2 normal without gallop, murmur, click, rub or other extra sounds.   Lungs:She has low-grade musical rhonchi/wheezes in the upper lobes which is homogenous.No increased WOB   Extremities:  No cyanosis, edema, or clubbing  noted    Skin: Warm & dry w/o tenting or  jaundice. No significant lesions or rash.     Assessment & Plan:  #1 asthmatic bronchitis  #2 acute bronchitis  #3 nonallergic rhinitis  Plan: See orders and recommendations

## 2014-08-11 ENCOUNTER — Telehealth: Payer: Self-pay | Admitting: Family Medicine

## 2014-08-11 DIAGNOSIS — Z13220 Encounter for screening for lipoid disorders: Secondary | ICD-10-CM

## 2014-08-11 NOTE — Telephone Encounter (Signed)
-----   Message from Ellamae Sia sent at 08/11/2014  8:19 AM EDT ----- Regarding: Lab orders for Tuesday, 4.26.16 Patient is scheduled for CPX labs, please order future labs, Thanks , Karna Christmas

## 2014-08-12 ENCOUNTER — Other Ambulatory Visit (INDEPENDENT_AMBULATORY_CARE_PROVIDER_SITE_OTHER): Payer: BC Managed Care – PPO

## 2014-08-12 DIAGNOSIS — Z13220 Encounter for screening for lipoid disorders: Secondary | ICD-10-CM

## 2014-08-12 LAB — COMPREHENSIVE METABOLIC PANEL
ALBUMIN: 4.2 g/dL (ref 3.5–5.2)
ALT: 22 U/L (ref 0–35)
AST: 18 U/L (ref 0–37)
Alkaline Phosphatase: 46 U/L (ref 39–117)
BILIRUBIN TOTAL: 0.6 mg/dL (ref 0.2–1.2)
BUN: 18 mg/dL (ref 6–23)
CALCIUM: 9.6 mg/dL (ref 8.4–10.5)
CO2: 30 mEq/L (ref 19–32)
CREATININE: 0.68 mg/dL (ref 0.40–1.20)
Chloride: 105 mEq/L (ref 96–112)
GFR: 93.94 mL/min (ref >60.00)
Glucose, Bld: 89 mg/dL (ref 70–99)
Potassium: 4.3 mEq/L (ref 3.5–5.1)
SODIUM: 140 meq/L (ref 135–145)
Total Protein: 6.8 g/dL (ref 6.0–8.3)

## 2014-08-12 LAB — LIPID PANEL
CHOLESTEROL: 158 mg/dL (ref 0–200)
HDL: 53.7 mg/dL (ref >39.00)
LDL Cholesterol: 96 mg/dL (ref 0–99)
NonHDL: 104.3
TRIGLYCERIDES: 43 mg/dL (ref 0.0–149.0)
Total CHOL/HDL Ratio: 3
VLDL: 8.6 mg/dL (ref 0.0–40.0)

## 2014-08-19 ENCOUNTER — Encounter: Payer: Self-pay | Admitting: Family Medicine

## 2014-08-19 ENCOUNTER — Ambulatory Visit (INDEPENDENT_AMBULATORY_CARE_PROVIDER_SITE_OTHER): Payer: BC Managed Care – PPO | Admitting: Family Medicine

## 2014-08-19 ENCOUNTER — Other Ambulatory Visit: Payer: Self-pay | Admitting: Family Medicine

## 2014-08-19 VITALS — BP 110/76 | HR 85 | Temp 98.4°F | Ht 65.5 in | Wt 174.5 lb

## 2014-08-19 DIAGNOSIS — H938X3 Other specified disorders of ear, bilateral: Secondary | ICD-10-CM | POA: Diagnosis not present

## 2014-08-19 DIAGNOSIS — M25551 Pain in right hip: Secondary | ICD-10-CM

## 2014-08-19 DIAGNOSIS — Z0000 Encounter for general adult medical examination without abnormal findings: Secondary | ICD-10-CM | POA: Diagnosis not present

## 2014-08-19 DIAGNOSIS — C4491 Basal cell carcinoma of skin, unspecified: Secondary | ICD-10-CM | POA: Diagnosis not present

## 2014-08-19 DIAGNOSIS — Z1159 Encounter for screening for other viral diseases: Secondary | ICD-10-CM | POA: Diagnosis not present

## 2014-08-19 DIAGNOSIS — Z23 Encounter for immunization: Secondary | ICD-10-CM

## 2014-08-19 NOTE — Progress Notes (Signed)
Subjective:    Patient ID: Lisa Salinas, female    DOB: 04-Nov-1955, 59 y.o.   MRN: [id number]  HPI  59 year old female patient presents for wellness visit. She is new to establish with me. Previously seen at GYN. Previously saw Billie Bean in 2009.   Anxiety off and on. Chest tightness in times a stress. Recently dog passed away, changes at work increase stress.  In past has taken a medication temporarily.  New onset pain in right lower abdomen/ lower pelvis/hip. GYN did US transvaginal nml. Hx of partial hysterectomy Occ having catching in right hip, painful.. Occurred last year. Worse when on feet a lot, walking more  Occ and crackling in ears. Ongoing for months.  No pain  Some congestion  Hx of cerumen impaction  HAs tried nasal saline occ.  Breast cancer: in remission  Exercise:None Diet: moderate, improving.  Review of Systems  Constitutional: Negative for fever and fatigue.  HENT: Negative for ear pain.   Eyes: Negative for pain.  Respiratory: Negative for chest tightness and shortness of breath.   Cardiovascular: Negative for chest pain, palpitations and leg swelling.  Gastrointestinal: Negative for abdominal pain.  Genitourinary: Negative for dysuria.       Objective:   Physical Exam  Constitutional: Vital signs are normal. She appears well-developed and well-nourished. She is cooperative.  Non-toxic appearance. She does not appear ill. No distress.  HENT:  Head: Normocephalic.  Right Ear: Hearing, external ear and ear canal normal. A middle ear effusion is present.  Left Ear: Hearing, external ear and ear canal normal. A middle ear effusion is present.  Nose: Nose normal.  Eyes: Conjunctivae, EOM and lids are normal. Pupils are equal, round, and reactive to light. Lids are everted and swept, no foreign bodies found.  Neck: Trachea normal and normal range of motion. Neck supple. Carotid bruit is not present. No thyroid mass and no thyromegaly present.    Cardiovascular: Normal rate, regular rhythm, S1 normal, S2 normal, normal heart sounds and intact distal pulses.  Exam reveals no gallop.   No murmur heard. Pulmonary/Chest: Effort normal and breath sounds normal. No respiratory distress. She has no wheezes. She has no rhonchi. She has no rales.  Abdominal: Soft. Normal appearance and bowel sounds are normal. She exhibits no distension, no fluid wave, no abdominal bruit and no mass. There is no hepatosplenomegaly. There is no tenderness. There is no rebound, no guarding and no CVA tenderness. No hernia.  Musculoskeletal:       Right hip: She exhibits decreased range of motion and tenderness. She exhibits normal strength and no bony tenderness.  Neg faber's, ttp anteriorly  Lymphadenopathy:    She has no cervical adenopathy.    She has no axillary adenopathy.  Neurological: She is alert. She has normal strength. No cranial nerve deficit or sensory deficit.  Skin: Skin is warm, dry and intact. No rash noted.  Psychiatric: Her speech is normal and behavior is normal. Judgment normal. Her mood appears not anxious. Cognition and memory are normal. She does not exhibit a depressed mood.          Assessment & Plan:  The patient's preventative maintenance and recommended screening tests for an annual wellness exam were reviewed in full today. Brought up to date unless services declined.  Counselled on the importance of diet, exercise, and its role in overall health and mortality. The patient's FH and SH was reviewed, including their home life, tobacco status, and drug  and alcohol status.   Vaccines: gets flu vaccine at work, due for Tdap  Colon: Last 2014   Polyps, nml repeat in 10 years. Mammo/ DVE: per gyn  Nonsmoker Hep C: will do

## 2014-08-19 NOTE — Patient Instructions (Addendum)
Starting exercise 150 min per week. Lower cholesterol in diet. Stop at lab on way out for hep C.  For nasal congestion and ear issues: increase nasal saline to 3-4 times a day, can also try nasal flonase 2 sprays per nostril daily.  Can use ibuprofen 800 mg every 8 hours as needed for pain in hip.  If worsening make follow up appt.

## 2014-08-19 NOTE — Progress Notes (Signed)
Pre visit review using our clinic review tool, if applicable. No additional management support is needed unless otherwise documented below in the visit note. 

## 2014-08-20 LAB — HEPATITIS C ANTIBODY: HCV AB: NEGATIVE

## 2014-09-10 DIAGNOSIS — H938X3 Other specified disorders of ear, bilateral: Secondary | ICD-10-CM | POA: Insufficient documentation

## 2014-09-10 NOTE — Assessment & Plan Note (Signed)
Likely due to OA. Treat with NSAIDs. Can use ibuprofen 800 mg every 8 hours as needed for pain in hip.  If worsening make follow up appt.

## 2014-09-10 NOTE — Assessment & Plan Note (Signed)
increase nasal saline to 3-4 times a day, can also try nasal flonase 2 sprays per nostril daily.

## 2015-01-09 ENCOUNTER — Other Ambulatory Visit: Payer: Self-pay | Admitting: Family Medicine

## 2015-01-09 ENCOUNTER — Other Ambulatory Visit: Payer: Self-pay

## 2015-01-09 DIAGNOSIS — N63 Unspecified lump in unspecified breast: Secondary | ICD-10-CM

## 2015-01-15 ENCOUNTER — Ambulatory Visit
Admission: RE | Admit: 2015-01-15 | Discharge: 2015-01-15 | Disposition: A | Discharge status: Home / Self Care | Payer: BC Managed Care – PPO | Source: Ambulatory Visit | Attending: Family Medicine | Admitting: Family Medicine

## 2015-01-15 DIAGNOSIS — N63 Unspecified lump in unspecified breast: Secondary | ICD-10-CM

## 2015-01-26 ENCOUNTER — Telehealth: Payer: Self-pay | Admitting: Family Medicine

## 2015-01-26 NOTE — Telephone Encounter (Signed)
[facility]  Patient Name: Lisa Salinas  DOB: July 10, 1955    Initial Comment Caller states calling to schedule appointment. Has urination urgency and back pain. Also has issues with heart racing when she lays down.    Nurse Assessment  Nurse: Wynetta Emery, RN, Baker Janus Date/Time Eilene Ghazi Time): 01/26/2015 4:41:26 PM  Confirm and document reason for call. If symptomatic, describe symptoms. ---Laiya is having frequency and urgency of urine on Friday took cranberry juice and now progressively gotten worse and when laying have heart palpitations onset two weeks along dizzy spells and shortness of breath  Has the patient traveled out of the country within the last 30 days? ---No  Does the patient have any new or worsening symptoms? ---Yes  Will a triage be completed? ---Yes  Related visit to physician within the last 2 weeks? ---No  Does the PT have any chronic conditions? (i.e. diabetes, asthma, etc.) ---Yes  List chronic conditions. ---Heart palpitations     Guidelines    Guideline Title Affirmed Question Affirmed Notes  Heart Rate and Heartbeat Questions Age > 60 years (Exception: brief heart beat symptoms that went away and now feels well)   Urinary Symptoms Urinating more frequently than usual (i.e., frequency)    Final Disposition User   See Physician within [place], RN, Baker Janus    Comments  01-27-2015 800am Diona Browner, Amy C/o heart palpitations possible UTI   Referrals  REFERRED TO PCP OFFICE   Disagree/Comply: Comply    Disagree/Comply: Comply

## 2015-01-27 ENCOUNTER — Ambulatory Visit (INDEPENDENT_AMBULATORY_CARE_PROVIDER_SITE_OTHER): Payer: BC Managed Care – PPO | Admitting: Family Medicine

## 2015-01-27 ENCOUNTER — Encounter: Payer: Self-pay | Admitting: Family Medicine

## 2015-01-27 VITALS — BP 116/78 | HR 96 | Temp 98.5°F | Ht 65.5 in | Wt 180.0 lb

## 2015-01-27 DIAGNOSIS — R3915 Urgency of urination: Secondary | ICD-10-CM | POA: Diagnosis not present

## 2015-01-27 DIAGNOSIS — N10 Acute tubulo-interstitial nephritis: Secondary | ICD-10-CM | POA: Diagnosis not present

## 2015-01-27 DIAGNOSIS — N390 Urinary tract infection, site not specified: Secondary | ICD-10-CM | POA: Insufficient documentation

## 2015-01-27 DIAGNOSIS — R002 Palpitations: Secondary | ICD-10-CM | POA: Diagnosis not present

## 2015-01-27 LAB — POCT URINALYSIS DIP (MANUAL ENTRY)
Bilirubin, UA: NEGATIVE
GLUCOSE UA: NEGATIVE
Ketones, POC UA: NEGATIVE
NITRITE UA: NEGATIVE
PROTEIN UA: NEGATIVE
Spec Grav, UA: 1.025
UROBILINOGEN UA: 0.2
pH, UA: 6

## 2015-01-27 MED ORDER — CIPROFLOXACIN HCL 500 MG PO TABS
500.0000 mg | ORAL_TABLET | Freq: Two times a day (BID) | ORAL | Status: DC
Start: 2015-01-27 — End: 2015-02-05

## 2015-01-27 NOTE — Addendum Note (Signed)
Addended by: Carter Kitten on: 01/27/2015 09:22 AM   Modules accepted: Orders

## 2015-01-27 NOTE — Progress Notes (Signed)
   Subjective:    Patient ID: Lisa Salinas, female    DOB: 06-10-1955, 59 y.o.   MRN: [id number]  HPI   59 year old female with history of anxiety presents with new onset urunary urgency, dysuria as well as  palpitations and dizziness.   In last  2 weeks she has noted heavy palpitations, noted most when lying down at night. Slight difficulty breathing.  Nml rate then a few beats fast or skipped beat then back to normal. Caused her to have trouble sleeping due to anxiety about the heart racing.  Improved last night some. Several weeks ago had sharp pains ( lasted few seconds)  in chest randomly, not always associated with exercise.  Has not had chest pain in last few weeks.  In last week developed urinary urgency and dysuria, dribbles when she gets there.  No fever, but warm yesterday. She has been using cranberry OTC for urinary issues. In last 24 hours felt more fatigued, achy all over. Now with low back ache.  Her mom has been sick and she has been under more stress lately.     Review of Systems  Constitutional: Positive for fatigue. Negative for fever.  HENT: Negative for ear pain.   Eyes: Negative for pain.  Respiratory: Negative for chest tightness and shortness of breath.   Cardiovascular: Positive for palpitations. Negative for chest pain and leg swelling.  Gastrointestinal: Negative for abdominal pain.  Genitourinary: Negative for dysuria.       Objective:   Physical Exam  Constitutional: Vital signs are normal. She appears well-developed and well-nourished. She is cooperative.  Non-toxic appearance. She does not appear ill. No distress.  HENT:  Head: Normocephalic.  Right Ear: Hearing, tympanic membrane, external ear and ear canal normal. Tympanic membrane is not erythematous, not retracted and not bulging.  Left Ear: Hearing, tympanic membrane, external ear and ear canal normal. Tympanic membrane is not erythematous, not retracted and not bulging.  Nose: No  mucosal edema or rhinorrhea. Right sinus exhibits no maxillary sinus tenderness and no frontal sinus tenderness. Left sinus exhibits no maxillary sinus tenderness and no frontal sinus tenderness.  Mouth/Throat: Uvula is midline, oropharynx is clear and moist and mucous membranes are normal.  Eyes: Conjunctivae, EOM and lids are normal. Pupils are equal, round, and reactive to light. Lids are everted and swept, no foreign bodies found.  Neck: Trachea normal and normal range of motion. Neck supple. Carotid bruit is not present. No thyroid mass and no thyromegaly present.  Cardiovascular: Normal rate, regular rhythm, S1 normal, S2 normal, normal heart sounds, intact distal pulses and normal pulses.  Exam reveals no gallop and no friction rub.   No murmur heard. Pulmonary/Chest: Effort normal and breath sounds normal. No tachypnea. No respiratory distress. She has no decreased breath sounds. She has no wheezes. She has no rhonchi. She has no rales.  Abdominal: Soft. Normal appearance and bowel sounds are normal. There is no tenderness.  Neurological: She is alert.  Skin: Skin is warm, dry and intact. No rash noted.  Psychiatric: Her speech is normal and behavior is normal. Judgment and thought content normal. Her mood appears anxious. Cognition and memory are normal. She does not exhibit a depressed mood.          Assessment & Plan:

## 2015-01-27 NOTE — Assessment & Plan Note (Signed)
Given tachycardia and systemic symptoms will treat  empericallyfor pyelo.  Send urine for culture if able to obtain a larger sample. Not enough for micro and UA appears positive.  Push fluids.

## 2015-01-27 NOTE — Patient Instructions (Signed)
Push fluids, avoid caffeine, work on stress reduction and relaxation. Complete antibiotics Cipro twice daily x 7 days. Call if urinary symptoms are not improved or if palpitation are not improving with treatment of urinary issues.

## 2015-01-27 NOTE — Assessment & Plan Note (Signed)
EKG normal today, but not currently with symptoms.  May be related to infection in urine. Avoid caffeine, push fluids, rest. If not imprpivng consider Holter monitor.

## 2015-01-27 NOTE — Progress Notes (Signed)
Pre visit review using our clinic review tool, if applicable. No additional management support is needed unless otherwise documented below in the visit note. 

## 2015-01-29 LAB — URINE CULTURE

## 2015-02-05 ENCOUNTER — Encounter: Payer: Self-pay | Admitting: Family Medicine

## 2015-02-05 ENCOUNTER — Ambulatory Visit (INDEPENDENT_AMBULATORY_CARE_PROVIDER_SITE_OTHER): Payer: BC Managed Care – PPO | Admitting: Family Medicine

## 2015-02-05 VITALS — BP 102/64 | HR 85 | Temp 99.1°F | Ht 65.5 in | Wt 178.5 lb

## 2015-02-05 DIAGNOSIS — J069 Acute upper respiratory infection, unspecified: Secondary | ICD-10-CM

## 2015-02-05 DIAGNOSIS — B9789 Other viral agents as the cause of diseases classified elsewhere: Secondary | ICD-10-CM

## 2015-02-05 DIAGNOSIS — N390 Urinary tract infection, site not specified: Secondary | ICD-10-CM

## 2015-02-05 DIAGNOSIS — R002 Palpitations: Secondary | ICD-10-CM | POA: Diagnosis not present

## 2015-02-05 LAB — POCT URINALYSIS DIP (MANUAL ENTRY)
Bilirubin, UA: NEGATIVE
Glucose, UA: NEGATIVE
Ketones, POC UA: NEGATIVE
Nitrite, UA: POSITIVE — AB
PH UA: 6
PROTEIN UA: NEGATIVE
RBC UA: NEGATIVE
SPEC GRAV UA: 1.025
UROBILINOGEN UA: 0.2

## 2015-02-05 MED ORDER — CIPROFLOXACIN HCL 500 MG PO TABS: 500.0000 mg | ORAL_TABLET | Freq: Two times a day (BID) | ORAL | Status: DC

## 2015-02-05 NOTE — Progress Notes (Signed)
   Subjective:    Patient ID: Lisa Salinas, female    DOB: October 15, 1955, 59 y.o.   MRN: [id number]  HPI  59 year old female pt treated for UTI/acute pyelonephritis on  10/11 (U Cx  Showed proteus mirabilis, sensitive to cipro, took for 7 days) presents with continued UTI symptoms.  She reports symptoms resolved completely until yesterday, 2 days after completing antibiotics. Heart racing resolved until yesterday. Yesterday she began having urinary urgency, sick feeling returned.  no current dysuria, no blood in urine. No abdominal pain, no  Measured fever   In last 4-5 days.. She has started having nasal congestion, sneezing sore throat, no face pain, no ear pain. Using flonase for symptoms. Helps a lot. Improving symptoms of sinus issues in last few days.  Review of Systems  Constitutional: Negative for fever and fatigue.  HENT: Negative for ear pain.   Eyes: Negative for pain.  Respiratory: Negative for chest tightness and shortness of breath.   Cardiovascular: Negative for chest pain, palpitations and leg swelling.  Gastrointestinal: Negative for abdominal pain.  Genitourinary: Negative for dysuria.       Objective:   Physical Exam  Constitutional: Vital signs are normal. She appears well-developed and well-nourished. She is cooperative.  Non-toxic appearance. She does not appear ill. No distress.  HENT:  Head: Normocephalic.  Right Ear: Hearing, tympanic membrane, external ear and ear canal normal. Tympanic membrane is not erythematous, not retracted and not bulging. No middle ear effusion.  Left Ear: Hearing, tympanic membrane, external ear and ear canal normal. Tympanic membrane is not erythematous, not retracted and not bulging.  No middle ear effusion.  Nose: Mucosal edema and rhinorrhea present. Right sinus exhibits no maxillary sinus tenderness and no frontal sinus tenderness. Left sinus exhibits no maxillary sinus tenderness and no frontal sinus tenderness.    Mouth/Throat: Uvula is midline and mucous membranes are normal. Posterior oropharyngeal erythema present. No posterior oropharyngeal edema.  Eyes: Conjunctivae, EOM and lids are normal. Pupils are equal, round, and reactive to light. Lids are everted and swept, no foreign bodies found.  Neck: Trachea normal and normal range of motion. Neck supple. Carotid bruit is not present. No thyroid mass and no thyromegaly present.  Cardiovascular: Normal rate, regular rhythm, S1 normal, S2 normal, normal heart sounds, intact distal pulses and normal pulses.  Exam reveals no gallop and no friction rub.   No murmur heard. Pulmonary/Chest: Effort normal and breath sounds normal. No tachypnea. No respiratory distress. She has no decreased breath sounds. She has no wheezes. She has no rhonchi. She has no rales.  Abdominal: Soft. Normal appearance and bowel sounds are normal. There is no hepatosplenomegaly. There is no tenderness. There is no CVA tenderness.  Neurological: She is alert.  Skin: Skin is warm, dry and intact. No rash noted.  Psychiatric: Her speech is normal and behavior is normal. Judgment and thought content normal. Her mood appears not anxious. Cognition and memory are normal. She does not exhibit a depressed mood.          Assessment & Plan:

## 2015-02-05 NOTE — Progress Notes (Signed)
Pre visit review using our clinic review tool, if applicable. No additional management support is needed unless otherwise documented below in the visit note. 

## 2015-02-05 NOTE — Assessment & Plan Note (Signed)
Symptomatic care 

## 2015-02-05 NOTE — Patient Instructions (Signed)
We will call with culture results.  Repeat course of cipro. Push fluids. Continue flonase and can add nasal saline or mucinex as needed for cough.

## 2015-02-05 NOTE — Assessment & Plan Note (Signed)
UA suggests, recurrent or partially treated UTI.  Will sned for culture.  Repeat course of cipro.  Push fluids rest.

## 2015-02-05 NOTE — Addendum Note (Signed)
Addended by: Carter Kitten on: 02/05/2015 08:52 AM   Modules accepted: Orders

## 2015-02-05 NOTE — Assessment & Plan Note (Signed)
NMl heart rate, nml EKG at last OV. Returned with recurrence of UTI symtpoms. ? Secondary to anxiety versus UTI. Push fluids.

## 2015-02-08 LAB — URINE CULTURE

## 2015-02-10 ENCOUNTER — Telehealth: Payer: Self-pay | Admitting: Family Medicine

## 2015-02-10 MED ORDER — CEPHALEXIN 500 MG PO CAPS: 500.0000 mg | ORAL_CAPSULE | Freq: Two times a day (BID) | ORAL | Status: AC

## 2015-02-10 NOTE — Telephone Encounter (Signed)
Lisa Salinas notified as instructed by telephone.

## 2015-02-10 NOTE — Telephone Encounter (Signed)
Notify pt  She does have some bacteria in urine, same kind, but resistant to cipro. Will treat with keflex given she has sulfa allergy.

## 2015-03-19 ENCOUNTER — Telehealth: Payer: Self-pay

## 2015-03-19 NOTE — Telephone Encounter (Signed)
Unable to leave message for patient regarding flu shot vaccination (phone line kept ringing).

## 2015-06-15 ENCOUNTER — Ambulatory Visit (INDEPENDENT_AMBULATORY_CARE_PROVIDER_SITE_OTHER): Payer: BC Managed Care – PPO | Admitting: Family Medicine

## 2015-06-15 ENCOUNTER — Encounter: Payer: Self-pay | Admitting: Family Medicine

## 2015-06-15 VITALS — BP 100/62 | HR 79 | Temp 98.8°F | Ht 65.5 in | Wt 178.0 lb

## 2015-06-15 DIAGNOSIS — J209 Acute bronchitis, unspecified: Secondary | ICD-10-CM

## 2015-06-15 DIAGNOSIS — R059 Cough, unspecified: Secondary | ICD-10-CM

## 2015-06-15 DIAGNOSIS — R05 Cough: Secondary | ICD-10-CM | POA: Diagnosis not present

## 2015-06-15 LAB — POCT INFLUENZA A/B
Influenza A, POC: NEGATIVE
Influenza B, POC: NEGATIVE

## 2015-06-15 MED ORDER — PREDNISONE 20 MG PO TABS: ORAL_TABLET | ORAL | Status: DC

## 2015-06-15 MED ORDER — AZITHROMYCIN 250 MG PO TABS: ORAL_TABLET | ORAL | Status: DC

## 2015-06-15 MED ORDER — ALBUTEROL SULFATE HFA 108 (90 BASE) MCG/ACT IN AERS
2.0000 | INHALATION_SPRAY | Freq: Four times a day (QID) | RESPIRATORY_TRACT | Status: DC | PRN
Start: 2015-06-15 — End: 2015-06-19

## 2015-06-15 NOTE — Progress Notes (Signed)
   Subjective:    Patient ID: Lisa Salinas, female    DOB: July 05, 1955, 60 y.o.   MRN: [id number]  Cough This is a new problem. The current episode started in the past 7 days (6 days ago). The problem has been gradually worsening. The cough is productive of sputum. Associated symptoms include ear congestion, nasal congestion and a sore throat. Pertinent negatives include no wheezing. Associated symptoms comments: Sinus pressure and pain, bloody nasal discharge. The symptoms are aggravated by lying down. Risk factors: nonsmoker. Treatments tried: tylenol severe allergy, had leftover symbicort which she restarted. There is no history of asthma, bronchitis, COPD or environmental allergies.   Sick contacts: son with flu  Social History /Family History/Past Medical History reviewed and updated if needed.   Review of Systems  HENT: Positive for sore throat.   Respiratory: Positive for cough. Negative for wheezing.   Allergic/Immunologic: Negative for environmental allergies.       Objective:   Physical Exam  Constitutional: Vital signs are normal. She appears well-developed and well-nourished. She is cooperative.  Non-toxic appearance. She does not appear ill. No distress.  HENT:  Head: Normocephalic.  Right Ear: Hearing, tympanic membrane, external ear and ear canal normal. Tympanic membrane is not erythematous, not retracted and not bulging.  Left Ear: Hearing, tympanic membrane, external ear and ear canal normal. Tympanic membrane is not erythematous, not retracted and not bulging.  Nose: Mucosal edema and rhinorrhea present. Right sinus exhibits no maxillary sinus tenderness and no frontal sinus tenderness. Left sinus exhibits no maxillary sinus tenderness and no frontal sinus tenderness.  Mouth/Throat: Uvula is midline, oropharynx is clear and moist and mucous membranes are normal.  Eyes: Conjunctivae, EOM and lids are normal. Pupils are equal, round, and reactive to light. Lids are  everted and swept, no foreign bodies found.  Neck: Trachea normal and normal range of motion. Neck supple. Carotid bruit is not present. No thyroid mass and no thyromegaly present.  Cardiovascular: Normal rate, regular rhythm, S1 normal, S2 normal, normal heart sounds, intact distal pulses and normal pulses.  Exam reveals no gallop and no friction rub.   No murmur heard. Pulmonary/Chest: Effort normal. No tachypnea. No respiratory distress. She has no decreased breath sounds. She has wheezes. She has no rhonchi. She has no rales.  Neurological: She is alert.  Skin: Skin is warm, dry and intact. No rash noted.  Psychiatric: Her speech is normal and behavior is normal. Judgment normal. Her mood appears not anxious. Cognition and memory are normal. She does not exhibit a depressed mood.          Assessment & Plan:

## 2015-06-15 NOTE — Progress Notes (Signed)
Pre visit review using our clinic review tool, if applicable. No additional management support is needed unless otherwise documented below in the visit note. 

## 2015-06-15 NOTE — Assessment & Plan Note (Signed)
Flu negative. Concerning for bacterial bronchitis.  Treat with antibiotics and prednisone taper.  Use albuterol inhaler as needed for cough and wheeze.  Go to ER if severe shortness of breath.

## 2015-06-15 NOTE — Addendum Note (Signed)
Addended by: Carter Kitten on: 06/15/2015 10:46 AM   Modules accepted: Orders

## 2015-06-15 NOTE — Addendum Note (Signed)
Addended by: Eliezer Lofts E on: 06/15/2015 10:39 AM   Modules accepted: Orders, SmartSet

## 2015-06-15 NOTE — Patient Instructions (Signed)
Flu negative. Concerning for bacterial bronchitis.  Treat with antibiotics and prednisone taper.  Use albuterol inhaler as needed for cough and wheeze.  Go to ER if severe shortness of breath.

## 2015-06-19 ENCOUNTER — Encounter: Payer: Self-pay | Admitting: Family Medicine

## 2015-06-19 ENCOUNTER — Ambulatory Visit (INDEPENDENT_AMBULATORY_CARE_PROVIDER_SITE_OTHER): Payer: BC Managed Care – PPO | Admitting: Family Medicine

## 2015-06-19 VITALS — BP 100/72 | HR 85 | Temp 98.4°F | Ht 65.5 in | Wt 177.8 lb

## 2015-06-19 DIAGNOSIS — J209 Acute bronchitis, unspecified: Secondary | ICD-10-CM

## 2015-06-19 MED ORDER — GUAIFENESIN-CODEINE 100-10 MG/5ML PO SYRP: 5.0000 mL | ORAL_SOLUTION | Freq: Every evening | ORAL | Status: DC | PRN

## 2015-06-19 NOTE — Progress Notes (Signed)
   Subjective:    Patient ID: Lisa Salinas, female    DOB: March 20, 1956, 60 y.o.   MRN: [id number]  HPI  60 year old female patient dx with acute bronchitis  06/15/2015  (after 7 days of cough) treated with  Azithromycin and prednisone taper presents for follow up. Using Mucinex DM. Complete antibiotics this AM, today she has 2 more day of prednisone.  Today she reports she is minimally better. Still with cough,Some SOB with coughing fits. No fever. Nasal congestion and  No sinus pressure.  Social History /Family History/Past Medical History reviewed and updated if needed.  nonsmoker   Review of Systems  Constitutional: Positive for fatigue. Negative for fever.  HENT: Negative for ear pain.   Eyes: Negative for pain.  Respiratory: Negative for chest tightness and shortness of breath.   Cardiovascular: Negative for chest pain, palpitations and leg swelling.  Gastrointestinal: Negative for abdominal pain.  Genitourinary: Negative for dysuria.       Objective:   Physical Exam  Constitutional: Vital signs are normal. She appears well-developed and well-nourished. She is cooperative.  Non-toxic appearance. She does not appear ill. No distress.  HENT:  Head: Normocephalic.  Right Ear: Hearing, tympanic membrane, external ear and ear canal normal. Tympanic membrane is not erythematous, not retracted and not bulging. No middle ear effusion.  Left Ear: Hearing, external ear and ear canal normal. Tympanic membrane is not erythematous, not retracted and not bulging. A middle ear effusion is present.  Nose: Mucosal edema and rhinorrhea present. Right sinus exhibits no maxillary sinus tenderness and no frontal sinus tenderness. Left sinus exhibits no maxillary sinus tenderness and no frontal sinus tenderness.  Mouth/Throat: Uvula is midline, oropharynx is clear and moist and mucous membranes are normal. No oropharyngeal exudate, posterior oropharyngeal edema, posterior oropharyngeal erythema  or tonsillar abscesses.  Eyes: Conjunctivae, EOM and lids are normal. Pupils are equal, round, and reactive to light. Lids are everted and swept, no foreign bodies found.  Neck: Trachea normal and normal range of motion. Neck supple. Carotid bruit is not present. No thyroid mass and no thyromegaly present.  Cardiovascular: Normal rate, regular rhythm, S1 normal, S2 normal, normal heart sounds, intact distal pulses and normal pulses.  Exam reveals no gallop and no friction rub.   No murmur heard. Pulmonary/Chest: Effort normal and breath sounds normal. No tachypnea. No respiratory distress. She has no decreased breath sounds. She has no wheezes. She has no rhonchi. She has no rales.  Wheeze resolved.  Neurological: She is alert.  Skin: Skin is warm, dry and intact. No rash noted.  Psychiatric: Her speech is normal and behavior is normal. Judgment normal. Her mood appears not anxious. Cognition and memory are normal. She does not exhibit a depressed mood.          Assessment & Plan:

## 2015-06-19 NOTE — Patient Instructions (Signed)
Flonase 2 sprays per nostril daily.  Start cough suppressant at night.  Complete prednisone.  Rest, Fluids.  Albuterol as needed for SOB or wheezing or coughing fits. Call if not improving by 06/23/2015 for consideration of second antibiotic course.

## 2015-06-19 NOTE — Progress Notes (Signed)
Pre visit review using our clinic review tool, if applicable. No additional management support is needed unless otherwise documented below in the visit note. 

## 2015-06-19 NOTE — Assessment & Plan Note (Signed)
Wheezing resolved with pred taper and cough somehwat improved. Rec giving antibiotics more time to work, complete pred taper, albuterol prn, gave cough suppressant at night prn, can use flonase for sinus symptoms.

## 2015-06-22 ENCOUNTER — Ambulatory Visit (INDEPENDENT_AMBULATORY_CARE_PROVIDER_SITE_OTHER): Payer: BC Managed Care – PPO | Admitting: Internal Medicine

## 2015-06-22 ENCOUNTER — Encounter: Payer: Self-pay | Admitting: Internal Medicine

## 2015-06-22 VITALS — BP 118/72 | HR 107 | Temp 97.8°F | Wt 174.0 lb

## 2015-06-22 DIAGNOSIS — J209 Acute bronchitis, unspecified: Secondary | ICD-10-CM

## 2015-06-22 DIAGNOSIS — J0100 Acute maxillary sinusitis, unspecified: Secondary | ICD-10-CM | POA: Diagnosis not present

## 2015-06-22 MED ORDER — AMOXICILLIN-POT CLAVULANATE 875-125 MG PO TABS: 1.0000 | ORAL_TABLET | Freq: Two times a day (BID) | ORAL | Status: DC

## 2015-06-22 NOTE — Progress Notes (Signed)
HPI  Pt presents to the clinic today with c/o facial pain and pressure and nasal congestion. This started 10 days ago but worsened 2 days ago. She is blowing yellow/green mucous out of her nose. She does have associated sore throat, cough, chest congestion and shortness of breath. She reports she has had fever, chills and body aches. She has tried Ecologist with minimal relief. She has no history of seasonal allergies or breathing problems.   Of note, she has been seen twice in the last week for acute bronchitis, treated with Azithromax, Prednisone and Guaifenesin with Codeine cough syrup. She reports she has not noticed any improvement.  Review of Systems    Past Medical History  Diagnosis Date  . Anxiety state, unspecified   . Dizziness and giddiness   . Malignant neoplasm of breast (female), unspecified site 7/06    Family History  Problem Relation Age of Onset  . Heart disease Mother   . Hypertension Mother   . Hyperlipidemia Mother   . Cancer Father 14    lung, smoker  . Cancer Maternal Aunt     breast cancer    Social History   Social History  . Marital Status: Married    Spouse Name: N/A  . Number of Children: N/A  . Years of Education: N/A   Occupational History  . Not on file.   Social History Main Topics  . Smoking status: Never Smoker   . Smokeless tobacco: Never Used  . Alcohol Use: No  . Drug Use: No  . Sexual Activity: Not on file   Other Topics Concern  . Not on file   Social History Narrative   MArried, husband Dominica Lisa Salinas    2 kids age 43 and 31    Allergies  Allergen Reactions  . Sulfonamide Derivatives     REACTION: Whelps     Constitutional: Positive headache, fatigue and fever. Denies abrupt weight changes.  HEENT:  Positive facial pain, nasal congestion and sore throat. Denies eye redness, ear pain, ringing in the ears, wax buildup, runny nose or bloody nose. Respiratory: Positive cough and shortness of breath. Denies difficulty  breathing.  Cardiovascular: Denies chest pain, chest tightness, palpitations or swelling in the hands or feet.   No other specific complaints in a complete review of systems (except as listed in HPI above).  Objective:  BP 118/72 mmHg  Pulse 107  Temp(Src) 97.8 F (36.6 C) (Oral)  Wt 174 lb (78.926 kg)  SpO2 97%   General: Appears her stated age, ill appearing in NAD. HEENT: Head: normal shape and size, maxillary sinus tenderness noted; Eyes: sclera white, no icterus, conjunctiva pink; Ears: Tm's pink but intact, normal light reflex, + serous effusion bilaterally; Nose: mucosa boggy and moist, septum midline; Throat/Mouth: + PND. Teeth present, mucosa erythematous and moist, no exudate noted, no lesions or ulcerations noted.  Neck:  No adenopathy noted.  Cardiovascular: Tachycardic with normal rhythm. S1,S2 noted.  No murmur, rubs or gallops noted.  Pulmonary/Chest: Normal effort and positive vesicular breath sounds. No respiratory distress. No wheezes, rales or ronchi noted.      Assessment & Plan:   Acute bacterial sinusitis  Can use a Neti Pot which can be purchased from your local drug store. Stop  Sudafed- tachycardia Flonase 2 sprays each nostril for 3 days and then as needed. Augmentin BID for 10 days  Acute Bronchitis:  Seems to have resolved Continue Robitussin AC for cough as needed  RTC as needed or  if symptoms persist.

## 2015-06-22 NOTE — Patient Instructions (Signed)

## 2015-06-22 NOTE — Progress Notes (Signed)
Pre visit review using our clinic review tool, if applicable. No additional management support is needed unless otherwise documented below in the visit note. 

## 2015-06-23 ENCOUNTER — Telehealth: Payer: Self-pay

## 2015-06-23 NOTE — Telephone Encounter (Signed)
Pt left v/m; pt was seen 06/22/15; pt got the abx for sinus infection; pt face is hurting badly and pt is not able to sleep due to pain; pt request pain med sent to [facility]. Pt request cb.

## 2015-06-24 NOTE — Telephone Encounter (Signed)
Pt advise to try otc ibuprofen with food---pt c/o rash on lower legs not very itchy and not bothersome as of now---advised pt to cont. Ax and take benadryl tonight to see if it helps---If rash gets worse or develop fever or other Sx stop Ax and call office--ok per Regina--pt expressed understanding

## 2015-06-24 NOTE — Telephone Encounter (Signed)
We don't give pain meds for sinus infection. She can try Ibuprofen. If that doesn't suffice, we can add in some Prednisone. Let me know what she would like to do.

## 2015-07-13 ENCOUNTER — Other Ambulatory Visit: Payer: Self-pay

## 2015-07-13 DIAGNOSIS — Z1231 Encounter for screening mammogram for malignant neoplasm of breast: Secondary | ICD-10-CM

## 2015-07-15 ENCOUNTER — Ambulatory Visit
Admission: RE | Admit: 2015-07-15 | Discharge: 2015-07-15 | Disposition: A | Discharge status: Home / Self Care | Payer: BC Managed Care – PPO | Source: Ambulatory Visit

## 2015-07-15 DIAGNOSIS — Z1231 Encounter for screening mammogram for malignant neoplasm of breast: Secondary | ICD-10-CM

## 2015-10-07 ENCOUNTER — Encounter: Payer: Self-pay | Admitting: Primary Care

## 2015-10-07 ENCOUNTER — Ambulatory Visit (INDEPENDENT_AMBULATORY_CARE_PROVIDER_SITE_OTHER): Payer: BC Managed Care – PPO | Admitting: Primary Care

## 2015-10-07 VITALS — BP 118/84 | HR 81 | Temp 97.9°F | Ht 65.5 in | Wt 177.8 lb

## 2015-10-07 DIAGNOSIS — N390 Urinary tract infection, site not specified: Secondary | ICD-10-CM

## 2015-10-07 LAB — POC URINALSYSI DIPSTICK (AUTOMATED)
Bilirubin, UA: NEGATIVE
Blood, UA: NEGATIVE
KETONES UA: NEGATIVE
Nitrite, UA: NEGATIVE
Protein, UA: NEGATIVE
SPEC GRAV UA: 1.01
Urobilinogen, UA: NEGATIVE
pH, UA: 7.5

## 2015-10-07 MED ORDER — CEPHALEXIN 500 MG PO CAPS: 500.0000 mg | ORAL_CAPSULE | Freq: Two times a day (BID) | ORAL | Status: DC

## 2015-10-07 NOTE — Progress Notes (Signed)
Pre visit review using our clinic review tool, if applicable. No additional management support is needed unless otherwise documented below in the visit note. 

## 2015-10-07 NOTE — Progress Notes (Signed)
   Subjective:    Patient ID: Lisa Salinas, female    DOB: Feb 03, 1956, 60 y.o.   MRN: [id number]  HPI  Lisa Salinas is a 60 year old female who presents today with a chief complaint of urinary frequency. She also reports foul smelling odor to her urine and flank pain. Her symptoms began 2 weeks ago. She started taking OTC cranberry tablets with temporary improvement. Over the last week she started feeling nauseated and without any improvement. Denies hematuria, vaginal discharge, vaginal itching, abdominal pain.  Review of Systems  Constitutional: Positive for fatigue. Negative for fever.  Gastrointestinal: Positive for nausea. Negative for abdominal pain.  Genitourinary: Positive for frequency, flank pain and pelvic pain. Negative for dysuria, hematuria and vaginal discharge.       Past Medical History  Diagnosis Date  . Anxiety state, unspecified   . Dizziness and giddiness   . Malignant neoplasm of breast (female), unspecified site 7/06     Social History   Social History  . Marital Status: Married    Spouse Name: N/A  . Number of Children: N/A  . Years of Education: N/A   Occupational History  . Not on file.   Social History Main Topics  . Smoking status: Never Smoker   . Smokeless tobacco: Never Used  . Alcohol Use: No  . Drug Use: No  . Sexual Activity: Not on file   Other Topics Concern  . Not on file   Social History Narrative   MArried, [name]    2 kids age 58 and 3    Past Surgical History  Procedure Laterality Date  . Breast biopsy      x 3---benign  . Partial hysterectomy      no cancer  . Tonsillectomy  Childhood  . Breast lumpectomy  10/17/2014    Family History  Problem Relation Age of Onset  . Heart disease Mother   . Hypertension Mother   . Hyperlipidemia Mother   . Cancer Father 42    lung, smoker  . Cancer Maternal Aunt     breast cancer    Allergies  Allergen Reactions  . Sulfonamide Derivatives     REACTION: Whelps      Current Outpatient Prescriptions on File Prior to Visit  Medication Sig Dispense Refill  . calcium citrate-vitamin D (CITRACAL+D) 315-200 MG-UNIT per tablet Take 1 tablet by mouth 2 (two) times daily.       No current facility-administered medications on file prior to visit.    BP 118/84 mmHg  Pulse 81  Temp(Src) 97.9 F (36.6 C) (Oral)  Ht 5' 5.5" (1.664 m)  Wt 177 lb 12.8 oz (80.65 kg)  BMI 29.13 kg/m2  SpO2 98%    Objective:   Physical Exam  Constitutional: She appears well-nourished.  Cardiovascular: Normal rate and regular rhythm.   Pulmonary/Chest: Effort normal and breath sounds normal.  Abdominal: There is tenderness in the suprapubic area. There is no CVA tenderness.  Skin: Skin is warm and dry.          Assessment & Plan:  UTI:  Urinary frequency, foul-smelling urine, flank pain 2 weeks. Minor, temporary improvement with OTC cranberry pills. Exam today mostly unremarkable, does not appear toxic. UA: 2+ leuks, negative nitrites and blood. Culture sent. Prescription for Keflex sent to pharmacy for treatment at this time. Discussed use of AZO for symptoms of dysuria if they occur. Increase water consumption and follow-up as needed.

## 2015-10-07 NOTE — Patient Instructions (Signed)
Start Cephalexin antibiotics. Take 1 capsule by mouth twice daily for 7 days.  Try AZO for any burning with urination or pelvic discomfort.  Ensure you are staying hydrated with water.  We will call you if your bacteria is resistant to the antibiotics.  It was a pleasure meeting you!

## 2015-10-09 LAB — URINE CULTURE

## 2015-11-26 ENCOUNTER — Telehealth: Payer: Self-pay | Admitting: Family Medicine

## 2015-11-26 NOTE — Telephone Encounter (Signed)
Needs appt for antibiotics.

## 2015-11-26 NOTE — Telephone Encounter (Signed)
Patient Name: Lisa Salinas DOB: 1955-12-02 Initial Comment Caller states she's out of town, may have a bladder or kidney infection. Frequency of urination, lower back pain. Nurse Assessment Nurse: Roosvelt Maser, RN, Barnetta Chapel Date/Time (Eastern Time): 11/26/2015 11:01:22 AM Confirm and document reason for call. If symptomatic, describe symptoms. You must click the next button to save text entered. ---caller states she started having pain with urination and increased frequency. also low back pain 5/10 no fever Has the patient traveled out of the country within the last 30 days? ---Not Applicable Does the patient have any new or worsening symptoms? ---Yes Will a triage be completed? ---Yes Related visit to physician within the last 2 weeks? ---No Does the PT have any chronic conditions? (i.e. diabetes, asthma, etc.) ---No Is this a behavioral health or substance abuse call? ---No Guidelines Guideline Title Affirmed Question Affirmed Notes Urination Pain - Female Side (flank) or lower back pain present Final Disposition User See Physician within 4 Hours (or PCP triage) Roosvelt Maser, RN, Barnetta Chapel Comments caller states she had a UTI 2-4 months ago same symptoms, is out of town until sunday and does not want to go to walk in/UC, telehealth offered. Referrals GO TO FACILITY UNDECIDED Disagree/Comply: Disagree Disagree/Comply Reason: Disagree with instructions

## 2015-11-26 NOTE — Telephone Encounter (Signed)
Lm on pts vm and advised per Dr Diona Browner.

## 2015-12-11 ENCOUNTER — Encounter: Payer: Self-pay | Admitting: Family Medicine

## 2015-12-11 ENCOUNTER — Ambulatory Visit (INDEPENDENT_AMBULATORY_CARE_PROVIDER_SITE_OTHER): Payer: BC Managed Care – PPO | Admitting: Family Medicine

## 2015-12-11 VITALS — BP 128/82 | HR 64 | Temp 97.9°F | Wt 168.0 lb

## 2015-12-11 DIAGNOSIS — N390 Urinary tract infection, site not specified: Secondary | ICD-10-CM | POA: Diagnosis not present

## 2015-12-11 DIAGNOSIS — R3915 Urgency of urination: Secondary | ICD-10-CM

## 2015-12-11 LAB — POC URINALSYSI DIPSTICK (AUTOMATED)
Bilirubin, UA: NEGATIVE
GLUCOSE UA: NEGATIVE
Ketones, UA: NEGATIVE
NITRITE UA: NEGATIVE
Spec Grav, UA: 1.025
UROBILINOGEN UA: NEGATIVE
pH, UA: 6

## 2015-12-11 MED ORDER — CEPHALEXIN 500 MG PO CAPS: 500.0000 mg | ORAL_CAPSULE | Freq: Two times a day (BID) | ORAL | 0 refills | Status: DC

## 2015-12-11 NOTE — Patient Instructions (Signed)
Please consider a urology referral. Let me of Dr. Diona Browner know if you would like [place] to make one for you.   Urinary Tract Infection Urinary tract infections (UTIs) can develop anywhere along your urinary tract. Your urinary tract is your body's drainage system for removing wastes and extra water. Your urinary tract includes two kidneys, two ureters, a bladder, and a urethra. Your kidneys are a pair of bean-shaped organs. Each kidney is about the size of your fist. They are located below your ribs, one on each side of your spine. CAUSES Infections are caused by microbes, which are microscopic organisms, including fungi, viruses, and bacteria. These organisms are so small that they can only be seen through a microscope. Bacteria are the microbes that most commonly cause UTIs. SYMPTOMS  Symptoms of UTIs may vary by age and gender of the patient and by the location of the infection. Symptoms in young women typically include a frequent and intense urge to urinate and a painful, burning feeling in the bladder or urethra during urination. Older women and men are more likely to be tired, shaky, and weak and have muscle aches and abdominal pain. A fever may mean the infection is in your kidneys. Other symptoms of a kidney infection include pain in your back or sides below the ribs, nausea, and vomiting. DIAGNOSIS To diagnose a UTI, your caregiver will ask you about your symptoms. Your caregiver will also ask you to provide a urine sample. The urine sample will be tested for bacteria and white blood cells. White blood cells are made by your body to help fight infection. TREATMENT  Typically, UTIs can be treated with medication. Because most UTIs are caused by a bacterial infection, they usually can be treated with the use of antibiotics. The choice of antibiotic and length of treatment depend on your symptoms and the type of bacteria causing your infection. HOME CARE INSTRUCTIONS  If you were prescribed  antibiotics, take them exactly as your caregiver instructs you. Finish the medication even if you feel better after you have only taken some of the medication.  Drink enough water and fluids to keep your urine clear or pale yellow.  Avoid caffeine, tea, and carbonated beverages. They tend to irritate your bladder.  Empty your bladder often. Avoid holding urine for long periods of time.  Empty your bladder before and after sexual intercourse.  After a bowel movement, women should cleanse from front to back. Use each tissue only once. SEEK MEDICAL CARE IF:   You have back pain.  You develop a fever.  Your symptoms do not begin to resolve within 3 days. SEEK IMMEDIATE MEDICAL CARE IF:   You have severe back pain or lower abdominal pain.  You develop chills.  You have nausea or vomiting.  You have continued burning or discomfort with urination. MAKE SURE YOU:   Understand these instructions.  Will watch your condition.  Will get help right away if you are not doing well or get worse.   This information is not intended to replace advice given to you by your health care provider. Make sure you discuss any questions you have with your health care provider.   Document Released: 01/12/2005 Document Revised: 12/24/2014 Document Reviewed: 05/13/2011 Elsevier Interactive Patient Education [employer].

## 2015-12-11 NOTE — Progress Notes (Signed)
   Subjective:    Patient ID: Lisa Salinas, female    DOB: [birth year], 60 y.o.   MRN: [id number]  HPI This is a pleasant 60 yo female who presents today with about 2 weeks of dysuria. Has had off and on, has been very busy with work and has been trying to manage with [place]. Has cut out soda and tea. Has urinary incontinence and always wears a panty liner. Has difficulty holding her urine when she stands up. Has had incontinence for at least 10 years. Had bladder suspension with hysterectomy. Urine usually pale yellow, has been cloudy lately. A little midline back pain. Has occasional short lasting, severe rectal pain with lifting/pushing/pulling heavy objects. Prior to symptoms starting, she was on car ride for several hours.   Past Medical History:  Diagnosis Date  . Anxiety state, unspecified   . Dizziness and giddiness   . Malignant neoplasm of breast (female), unspecified site 7/06   Past Surgical History:  Procedure Laterality Date  . BREAST BIOPSY     x 3---benign  . BREAST LUMPECTOMY  10/17/2014  . PARTIAL HYSTERECTOMY     no cancer  . TONSILLECTOMY  Childhood   Family History  Problem Relation Age of Onset  . Heart disease Mother   . Hypertension Mother   . Hyperlipidemia Mother   . Cancer Father 26    lung, smoker  . Cancer Maternal Aunt     breast cancer   Social History  Substance Use Topics  . Smoking status: Never Smoker  . Smokeless tobacco: Never Used  . Alcohol use No        Review of Systems     Objective:   Physical Exam    BP 128/82   Pulse 64   Temp 97.9 F (36.6 C)   Wt 168 lb (76.2 kg)   SpO2 92%   BMI 27.53 kg/m  Wt Readings from Last 3 Encounters:  12/11/15 168 lb (76.2 kg)  10/07/15 177 lb 12.8 oz (80.6 kg)  06/22/15 174 lb (78.9 kg)   Results for orders placed or performed in visit on 12/11/15  POCT Urinalysis Dipstick (Automated)  Result Value Ref Range   Color, UA YELLO    Clarity, UA CLOUDY    Glucose, UA NEG    Bilirubin, UA NEG    Ketones, UA NEG    Spec Grav, UA 1.025    Blood, UA 1+    pH, UA 6.0    Protein, UA +-    Urobilinogen, UA negative    Nitrite, UA NEG    Leukocytes, UA large (3+) (A) Negative       Assessment & Plan:  1. Urinary urgency - POCT Urinalysis Dipstick (Automated)  2. Urinary tract infection without hematuria, site unspecified - cephALEXin (KEFLEX) 500 MG capsule; Take 1 capsule (500 mg total) by mouth 2 (two) times daily.  Dispense: 14 capsule; Refill: 0 - Urine culture - Provided written and verbal information regarding diagnosis and treatment. - RTC precautions reviewed.  3. Recurrent UTI (urinary tract infection) - discussed her frequent UTIs and her urinary incontinence and offered referral to urology. Discussed need to determine any anatomical abnormalities and would like her to have relief from incontinence. She continues to be reluctant, but will think about it.   Clarene Reamer, FNP-BC  [facility], [facility]  12/11/2015 4:31 PM

## 2015-12-14 LAB — URINE CULTURE

## 2015-12-16 ENCOUNTER — Telehealth: Payer: Self-pay | Admitting: Family Medicine

## 2015-12-16 NOTE — Telephone Encounter (Signed)
Great! Thanks you

## 2015-12-16 NOTE — Telephone Encounter (Signed)
Patient returned Araceli's call.  I let patient know her lab results showed she's taking the right antibiotic. She voiced understanding.

## 2016-07-26 ENCOUNTER — Other Ambulatory Visit: Payer: Self-pay | Admitting: Family Medicine

## 2016-07-26 DIAGNOSIS — Z853 Personal history of malignant neoplasm of breast: Secondary | ICD-10-CM

## 2016-07-26 DIAGNOSIS — Z1231 Encounter for screening mammogram for malignant neoplasm of breast: Secondary | ICD-10-CM

## 2016-08-03 ENCOUNTER — Encounter: Payer: Self-pay | Admitting: Family Medicine

## 2016-08-03 ENCOUNTER — Ambulatory Visit (INDEPENDENT_AMBULATORY_CARE_PROVIDER_SITE_OTHER): Payer: BC Managed Care – PPO | Admitting: Family Medicine

## 2016-08-03 VITALS — BP 120/78 | HR 78 | Temp 98.5°F | Wt 167.0 lb

## 2016-08-03 DIAGNOSIS — M545 Low back pain, unspecified: Secondary | ICD-10-CM

## 2016-08-03 DIAGNOSIS — R350 Frequency of micturition: Secondary | ICD-10-CM | POA: Diagnosis not present

## 2016-08-03 DIAGNOSIS — J029 Acute pharyngitis, unspecified: Secondary | ICD-10-CM | POA: Diagnosis not present

## 2016-08-03 LAB — POCT URINALYSIS DIPSTICK
Bilirubin, UA: NEGATIVE
KETONES UA: NEGATIVE
Leukocytes, UA: NEGATIVE
Nitrite, UA: NEGATIVE
PH UA: 6.5 (ref 5.0–8.0)
PROTEIN UA: NEGATIVE
RBC UA: NEGATIVE
SPEC GRAV UA: 1.015 (ref 1.010–1.025)
Urobilinogen, UA: 0.2 E.U./dL

## 2016-08-03 LAB — POCT RAPID STREP A (OFFICE): RAPID STREP A SCREEN: NEGATIVE

## 2016-08-03 NOTE — Progress Notes (Signed)
Pre visit review using our clinic review tool, if applicable. No additional management support is needed unless otherwise documented below in the visit note. 

## 2016-08-03 NOTE — Progress Notes (Signed)
Subjective:    Patient ID: Lisa Salinas, female    DOB: 1955-07-03, 61 y.o.   MRN: [id number]  HPI This is a 61 yo female who presents with urinary frequency and back pain x 3 weeks. Did some lifting for work prior to pain starting. No numbness, tingling or weakness. No radiation of pain. Pain across low back. Pain worse at night and in the morning. Taking Alleve 1x/ day with some relief. Some relief with heated seat while driving. Has been limiting activities and movement. No dysuria. Has had frequent UTIs in the past, usually accompanied by dysuria.   She has had sore throat x 1 day. No fever. Having yellow, green nasal drainage. Some cough with post nasal drainage. No ear pain or headache.   Past Medical History:  Diagnosis Date  . Anxiety state, unspecified   . Dizziness and giddiness   . Malignant neoplasm of breast (female), unspecified site 7/06   Past Surgical History:  Procedure Laterality Date  . BREAST BIOPSY     x 3---benign  . BREAST LUMPECTOMY  10/17/2014  . PARTIAL HYSTERECTOMY     no cancer  . TONSILLECTOMY  Childhood   Family History  Problem Relation Age of Onset  . Heart disease Mother   . Hypertension Mother   . Hyperlipidemia Mother   . Cancer Father 68    lung, smoker  . Cancer Maternal Aunt     breast cancer   Social History  Substance Use Topics  . Smoking status: Never Smoker  . Smokeless tobacco: Never Used  . Alcohol use No      Review of Systems Per HPI    Objective:   Physical Exam  Constitutional: She is oriented to person, place, and time. She appears well-developed and well-nourished. No distress.  HENT:  Head: Normocephalic and atraumatic.  Right Ear: Tympanic membrane, external ear and ear canal normal.  Left Ear: Tympanic membrane, external ear and ear canal normal.  Nose: Nose normal.  Mouth/Throat: Uvula is midline. Posterior oropharyngeal erythema present. No oropharyngeal exudate or posterior oropharyngeal edema.    Moderate amount post nasal drainage.   Cardiovascular: Normal rate, regular rhythm and normal heart sounds.   Pulmonary/Chest: Effort normal and breath sounds normal.  Musculoskeletal: Normal range of motion.       Cervical back: Normal.       Thoracic back: Normal.       Lumbar back: Normal.  Neurological: She is alert and oriented to person, place, and time.  Skin: Skin is warm and dry. She is not diaphoretic.  Psychiatric: She has a normal mood and affect. Her behavior is normal. Judgment and thought content normal.  Vitals reviewed.     BP 120/78 (BP Location: Right Arm, Patient Position: Sitting, Cuff Size: Normal)   Pulse 78   Temp 98.5 F (36.9 C) (Oral)   Wt 167 lb (75.8 kg)   SpO2 98%   BMI 27.37 kg/m  Wt Readings from Last 3 Encounters:  08/03/16 167 lb (75.8 kg)  12/11/15 168 lb (76.2 kg)  10/07/15 177 lb 12.8 oz (80.6 kg)   Results for orders placed or performed in visit on 08/03/16  POCT rapid strep A  Result Value Ref Range   Rapid Strep A Screen Negative Negative  POCT urinalysis dipstick  Result Value Ref Range   Color, UA yellow    Clarity, UA clear    Glucose, UA beg    Bilirubin, UA neg    Ketones,  UA neg    Spec Grav, UA 1.015 1.010 - 1.025   Blood, UA neg    pH, UA 6.5 5.0 - 8.0   Protein, UA neg    Urobilinogen, UA 0.2 0.2 or 1.0 E.U./dL   Nitrite, UA neg    Leukocytes, UA Negative Negative       Assessment & Plan:  1. Acute midline low back pain without sciatica - Provided written and verbal information regarding diagnosis and treatment. - can increase Alleve to one tablet BID for a couple of days, heat prn, increase activity, provided stretching exercises and encouraged increased walking.  - RTC precautions reviewed - POCT urinalysis dipstick  2. Sore throat - rapid strep negative, likely from post nasal drainage, add OTC long acting antihistamine and comfort measures - POCT rapid strep A  3. Urinary frequency - urine negative -  POCT urinalysis dipstick   Clarene Reamer, FNP-BC  [facility] [place], [place]  08/03/2016 12:23 PM

## 2016-08-03 NOTE — Patient Instructions (Signed)
For a couple of days, try Alleve 1 tablet twice a day then can go back to as needed Increase walking and add stretches If worse, please come back in   Your strep test is negative, your sore throat is likely from post nasal drainage. Please try gargling with warm salt water, can also try over the counter throat spray. Can add an over the counter antihistamine like Zyrtec, Claritin or Allegra (generic is fine).   Back Exercises The following exercises strengthen the muscles that help to support the back. They also help to keep the lower back flexible. Doing these exercises can help to prevent back pain or lessen existing pain. If you have back pain or discomfort, try doing these exercises 2-3 times each day or as told by your health care provider. When the pain goes away, do them once each day, but increase the number of times that you repeat the steps for each exercise (do more repetitions). If you do not have back pain or discomfort, do these exercises once each day or as told by your health care provider. Exercises Single Knee to Chest   Repeat these steps 3-5 times for each leg: 1. Lie on your back on a firm bed or the floor with your legs extended. 2. Bring one knee to your chest. Your other leg should stay extended and in contact with the floor. 3. Hold your knee in place by grabbing your knee or thigh. 4. Pull on your knee until you feel a gentle stretch in your lower back. 5. Hold the stretch for 10-30 seconds. 6. Slowly release and straighten your leg. Pelvic Tilt   Repeat these steps 5-10 times: 1. Lie on your back on a firm bed or the floor with your legs extended. 2. Bend your knees so they are pointing toward the ceiling and your feet are flat on the floor. 3. Tighten your lower abdominal muscles to press your lower back against the floor. This motion will tilt your pelvis so your tailbone points up toward the ceiling instead of pointing to your feet or the floor. 4. With gentle  tension and even breathing, hold this position for 5-10 seconds. Cat-Cow   Repeat these steps until your lower back becomes more flexible: 1. Get into a hands-and-knees position on a firm surface. Keep your hands under your shoulders, and keep your knees under your hips. You may place padding under your knees for comfort. 2. Let your head hang down, and point your tailbone toward the floor so your lower back becomes rounded like the back of a cat. 3. Hold this position for 5 seconds. 4. Slowly lift your head and point your tailbone up toward the ceiling so your back forms a sagging arch like the back of a cow. 5. Hold this position for 5 seconds. Press-Ups   Repeat these steps 5-10 times: 1. Lie on your abdomen (face-down) on the floor. 2. Place your palms near your head, about shoulder-width apart. 3. While you keep your back as relaxed as possible and keep your hips on the floor, slowly straighten your arms to raise the top half of your body and lift your shoulders. Do not use your back muscles to raise your upper torso. You may adjust the placement of your hands to make yourself more comfortable. 4. Hold this position for 5 seconds while you keep your back relaxed. 5. Slowly return to lying flat on the floor. Bridges   Repeat these steps 10 times: 1. Lie on  your back on a firm surface. 2. Bend your knees so they are pointing toward the ceiling and your feet are flat on the floor. 3. Tighten your buttocks muscles and lift your buttocks off of the floor until your waist is at almost the same height as your knees. You should feel the muscles working in your buttocks and the back of your thighs. If you do not feel these muscles, slide your feet 1-2 inches farther away from your buttocks. 4. Hold this position for 3-5 seconds. 5. Slowly lower your hips to the starting position, and allow your buttocks muscles to relax completely. If this exercise is too easy, try doing it with your arms  crossed over your chest. Abdominal Crunches   Repeat these steps 5-10 times: 1. Lie on your back on a firm bed or the floor with your legs extended. 2. Bend your knees so they are pointing toward the ceiling and your feet are flat on the floor. 3. Cross your arms over your chest. 4. Tip your chin slightly toward your chest without bending your neck. 5. Tighten your abdominal muscles and slowly raise your trunk (torso) high enough to lift your shoulder blades a tiny bit off of the floor. Avoid raising your torso higher than that, because it can put too much stress on your low back and it does not help to strengthen your abdominal muscles. 6. Slowly return to your starting position. Back Lifts  Repeat these steps 5-10 times: 1. Lie on your abdomen (face-down) with your arms at your sides, and rest your forehead on the floor. 2. Tighten the muscles in your legs and your buttocks. 3. Slowly lift your chest off of the floor while you keep your hips pressed to the floor. Keep the back of your head in line with the curve in your back. Your eyes should be looking at the floor. 4. Hold this position for 3-5 seconds. 5. Slowly return to your starting position. Contact a health care provider if:  Your back pain or discomfort gets much worse when you do an exercise.  Your back pain or discomfort does not lessen within 2 hours after you exercise. If you have any of these problems, stop doing these exercises right away. Do not do them again unless your health care provider says that you can. Get help right away if:  You develop sudden, severe back pain. If this happens, stop doing the exercises right away. Do not do them again unless your health care provider says that you can. This information is not intended to replace advice given to you by your health care provider. Make sure you discuss any questions you have with your health care provider. Document Released: 05/12/2004 Document Revised: 08/12/2015  Document Reviewed: 05/29/2014 Elsevier Interactive Patient Education  2017 [employer].

## 2016-08-18 ENCOUNTER — Ambulatory Visit
Admission: RE | Admit: 2016-08-18 | Discharge: 2016-08-18 | Disposition: A | Discharge status: Home / Self Care | Payer: BC Managed Care – PPO | Source: Ambulatory Visit | Attending: Family Medicine | Admitting: Family Medicine

## 2016-08-18 DIAGNOSIS — Z853 Personal history of malignant neoplasm of breast: Secondary | ICD-10-CM

## 2016-08-18 DIAGNOSIS — Z1231 Encounter for screening mammogram for malignant neoplasm of breast: Secondary | ICD-10-CM

## 2016-08-18 HISTORY — DX: Personal history of irradiation: Z92.3

## 2016-08-18 HISTORY — DX: Personal history of antineoplastic chemotherapy: Z92.21

## 2016-08-19 ENCOUNTER — Other Ambulatory Visit: Payer: Self-pay | Admitting: Family Medicine

## 2016-08-19 DIAGNOSIS — R928 Other abnormal and inconclusive findings on diagnostic imaging of breast: Secondary | ICD-10-CM

## 2016-08-22 ENCOUNTER — Telehealth: Payer: Self-pay | Admitting: *Deleted

## 2016-08-22 ENCOUNTER — Telehealth: Payer: Self-pay | Admitting: Family Medicine

## 2016-08-22 NOTE — Telephone Encounter (Signed)
error 

## 2016-08-22 NOTE — Telephone Encounter (Signed)
Orders were in Dr. Rometta Emery in box.  I stamped her signature and faxed back to [phone].

## 2016-08-22 NOTE — Telephone Encounter (Signed)
The breast center called and said they faxed over an order to be signed for Dx mammogram and possible u/s. I looked in the tower and didn't see it. Pt has appt tomorrow and is requesting order be signed and faxed back as requested. Advised Dr Diona Browner out of office. States order can be signed by another physician

## 2016-08-23 ENCOUNTER — Ambulatory Visit
Admission: RE | Admit: 2016-08-23 | Discharge: 2016-08-23 | Disposition: A | Discharge status: Home / Self Care | Payer: BC Managed Care – PPO | Source: Ambulatory Visit | Attending: Family Medicine | Admitting: Family Medicine

## 2016-08-23 ENCOUNTER — Other Ambulatory Visit: Payer: Self-pay | Admitting: Family Medicine

## 2016-08-23 DIAGNOSIS — R928 Other abnormal and inconclusive findings on diagnostic imaging of breast: Secondary | ICD-10-CM

## 2016-08-23 DIAGNOSIS — R921 Mammographic calcification found on diagnostic imaging of breast: Secondary | ICD-10-CM

## 2016-08-25 ENCOUNTER — Ambulatory Visit
Admission: RE | Admit: 2016-08-25 | Discharge: 2016-08-25 | Disposition: A | Discharge status: Home / Self Care | Payer: BC Managed Care – PPO | Source: Ambulatory Visit | Attending: Family Medicine | Admitting: Family Medicine

## 2016-08-25 DIAGNOSIS — R921 Mammographic calcification found on diagnostic imaging of breast: Secondary | ICD-10-CM

## 2016-09-02 ENCOUNTER — Telehealth: Payer: Self-pay | Admitting: Oncology

## 2016-09-02 ENCOUNTER — Encounter: Payer: Self-pay | Admitting: Oncology

## 2016-09-02 NOTE — Telephone Encounter (Signed)
Per the pt's request, an appt has been scheduled for the pt to see Dr. Jana Hakim on 5/31 at 430pm.

## 2016-09-05 DIAGNOSIS — Z923 Personal history of irradiation: Secondary | ICD-10-CM | POA: Insufficient documentation

## 2016-09-06 ENCOUNTER — Ambulatory Visit: Payer: BC Managed Care – PPO | Admitting: Hematology and Oncology

## 2016-09-13 ENCOUNTER — Ambulatory Visit (INDEPENDENT_AMBULATORY_CARE_PROVIDER_SITE_OTHER)
Admission: RE | Admit: 2016-09-13 | Discharge: 2016-09-13 | Disposition: A | Discharge status: Home / Self Care | Payer: BC Managed Care – PPO | Source: Ambulatory Visit | Attending: Family Medicine | Admitting: Family Medicine

## 2016-09-13 ENCOUNTER — Encounter: Payer: Self-pay | Admitting: Family Medicine

## 2016-09-13 ENCOUNTER — Ambulatory Visit (INDEPENDENT_AMBULATORY_CARE_PROVIDER_SITE_OTHER): Payer: BC Managed Care – PPO | Admitting: Family Medicine

## 2016-09-13 VITALS — BP 133/80 | HR 83 | Temp 98.5°F | Ht 65.5 in | Wt 164.5 lb

## 2016-09-13 DIAGNOSIS — C50911 Malignant neoplasm of unspecified site of right female breast: Secondary | ICD-10-CM

## 2016-09-13 DIAGNOSIS — F339 Major depressive disorder, recurrent, unspecified: Secondary | ICD-10-CM | POA: Insufficient documentation

## 2016-09-13 DIAGNOSIS — M545 Low back pain, unspecified: Secondary | ICD-10-CM

## 2016-09-13 DIAGNOSIS — F331 Major depressive disorder, recurrent, moderate: Secondary | ICD-10-CM

## 2016-09-13 MED ORDER — TRAZODONE HCL 50 MG PO TABS: 25.0000 mg | ORAL_TABLET | Freq: Every evening | ORAL | 3 refills | Status: DC | PRN

## 2016-09-13 MED ORDER — SERTRALINE HCL 50 MG PO TABS: 50.0000 mg | ORAL_TABLET | Freq: Every day | ORAL | 3 refills | Status: DC

## 2016-09-13 NOTE — Assessment & Plan Note (Signed)
Most likely ongoing MSK strain.  Look into ergonomics at work.Use NSAIDs, heat and low back stretches.  Given current breast cancer and persistence of pain.Marland Kitcheneval with X-ray.

## 2016-09-13 NOTE — Assessment & Plan Note (Signed)
Start sertraline for depression and trazodone for sleep at night.  offered counseling, pt not interested.  Close follow up in 4 weeks.

## 2016-09-13 NOTE — Patient Instructions (Addendum)
Start sertraline for mood at bedtime.  Use trazodone as needed for sleep.  We will call with X-ray results.  Use aleve 2 times daily as needed for low back pain.  Continue low back stretches.

## 2016-09-13 NOTE — Progress Notes (Signed)
   Subjective:    Patient ID: Lisa Salinas, female    DOB: Nov 23, 1955, 61 y.o.   MRN: [id number]  HPI    61 year old female with recent diagnosis of recurrent  rightbreast cancer presents with continued low back pain 2-3 months.  She saw Clarene Reamer on 4/18.. For acute midline pain without sciatica.  treated with aleve, heat and home PT.  Continuing pain in mid low back.. No radiation of pain, no numbness, no weakness.  3-6/10  Low back feels better when not at work.  Has no support in chair at work.  Using  Aleve at work.  ONC Dr. Griffith Citron.. Has appt Thursday.  Surgeon: Dr. Dalbert Batman... Mastectomy recommended  She does not plan on doing reconstruction. PMH significant for right breast ca s/p neoadjuvant chemotherapy, lumpectomy/SLN ([facility]), XRT 2007 for 1.7 cm tumor, ER/PR+, Her2 +, followed by adjuvant AI x 5 years. Given this mastectomy has been recommended. Accompanied by daughter. Works in [employer] for [employer], lives with husband.    She has no history of surgery  Or ESI  In low back.     Review of Systems  Constitutional: Negative for fatigue and fever.  HENT: Negative for ear pain.   Eyes: Negative for pain.  Respiratory: Negative for chest tightness and shortness of breath.   Cardiovascular: Negative for chest pain, palpitations and leg swelling.  Gastrointestinal: Negative for abdominal pain.  Genitourinary: Negative for dysuria.       Objective:   Physical Exam  Constitutional: Vital signs are normal. She appears well-developed and well-nourished. She is cooperative.  Non-toxic appearance. She does not appear ill. No distress.  HENT:  Head: Normocephalic.  Right Ear: Hearing, tympanic membrane, external ear and ear canal normal.  Left Ear: Hearing, tympanic membrane, external ear and ear canal normal.  Nose: Nose normal.  Eyes: Conjunctivae, EOM and lids are normal. Pupils are equal, round, and reactive to light. Lids are everted and swept, no foreign  bodies found.  Neck: Trachea normal and normal range of motion. Neck supple. Carotid bruit is not present. No thyroid mass and no thyromegaly present.  Cardiovascular: Normal rate, regular rhythm, S1 normal, S2 normal, normal heart sounds and intact distal pulses.  Exam reveals no gallop.   No murmur heard. Pulmonary/Chest: Effort normal and breath sounds normal. No respiratory distress. She has no wheezes. She has no rhonchi. She has no rales.  Abdominal: Soft. Normal appearance and bowel sounds are normal. She exhibits no distension, no fluid wave, no abdominal bruit and no mass. There is no hepatosplenomegaly. There is no tenderness. There is no rebound, no guarding and no CVA tenderness. No hernia.  Lymphadenopathy:    She has no cervical adenopathy.    She has no axillary adenopathy.  Neurological: She is alert. She has normal strength. No cranial nerve deficit or sensory deficit.  Skin: Skin is warm, dry and intact. No rash noted.  Psychiatric: Her speech is normal. Judgment normal. Her mood appears not anxious. Her affect is not blunt. She is slowed and withdrawn. Cognition and memory are normal. She exhibits a depressed mood. She expresses no homicidal and no suicidal ideation. She expresses no suicidal plans and no homicidal plans.          Assessment & Plan:

## 2016-09-13 NOTE — Assessment & Plan Note (Signed)
Planned bilateral mastectomy. Has upcoming appt with ON[facility].

## 2016-09-15 ENCOUNTER — Ambulatory Visit (HOSPITAL_BASED_OUTPATIENT_CLINIC_OR_DEPARTMENT_OTHER): Payer: BC Managed Care – PPO | Admitting: Oncology

## 2016-09-15 DIAGNOSIS — Z79810 Long term (current) use of selective estrogen receptor modulators (SERMs): Secondary | ICD-10-CM

## 2016-09-15 DIAGNOSIS — C50811 Malignant neoplasm of overlapping sites of right female breast: Secondary | ICD-10-CM

## 2016-09-15 DIAGNOSIS — D0511 Intraductal carcinoma in situ of right breast: Secondary | ICD-10-CM | POA: Diagnosis not present

## 2016-09-15 DIAGNOSIS — Z170 Estrogen receptor positive status [ER+]: Secondary | ICD-10-CM

## 2016-09-15 NOTE — Progress Notes (Addendum)
[facility]  [phone] [phone]     ID: Lisa Salinas DOB: [birth year]  MR#: [id number]  LAT#:[id number]  Patient Care Team: Lisa Seltzer, MD as PCP - General Lisa Dell, MD OTHER MD:  CHIEF COMPLAINT: Estrogen receptor positive ductal carcinoma in situ  CURRENT TREATMENT: Awaiting definitive surgery   BREAST CANCER HISTORY: I saw Lisa Salinas remotely for an invasive right-sided breast cancer initially diagnosed in 2006. We have requested the records for review, but from the patient's recollection: She was treated neoadjuvantly with tamoxifen, but on repeat imaging and there had been no significant response. She underwent right lumpectomy and sentinel lymph node sampling early in 2007. She tells me the lymph nodes were negative. She then received chemotherapy followed by radiation. She then took Aromasin for 5 years, and was discharged from follow-up here in 2012.  More recently, on 08/18/2016, she underwent bilateral screening mammography with tomography at the breast Center. This found the breast density to be category C. In the right breast there were calcifications warranting further evaluation and on 08/23/2016 she underwent a unilateral right diagnostic mammogram which found pleomorphic calcifications in the upper outer quadrant of the right breast spanning 0.5 cm. There was a second area of punctate calcifications adjacent to the biopsy site slightly more posterior.  On 08/25/2016 she underwent a biopsy of the main area of concern in the right breast, and this found ductal carcinoma in situ, grade 2, estrogen receptor 100% positive, progesterone receptor 10% positive, both with strong staining.  Her subsequent history is as detailed below   INTERVAL HISTORY: Lisa Salinas was evaluated in the breast clinic 09/15/2016 accompanied by her husband Lisa Salinas and daughter Lisa Salinas  REVIEW OF SYSTEMS: There were no specific symptoms leading to the original  mammogram, which was routinely scheduled. The patient denies unusual headaches, visual changes, nausea, vomiting, stiff neck, dizziness, or gait imbalance. There has been no cough, phlegm production, or pleurisy, no chest pain or pressure, and no change in bowel or bladder habits. The patient denies fever, rash, bleeding, unexplained fatigue or unexplained weight loss. She admits to problems with insomnia, frequent urinary infections, chronic mild intermittent low back pain, palpitations associated with coffee intake and history of a basal cell carcinoma removed 3 years ago from her right face. A detailed review of systems was otherwise entirely negative.   PAST MEDICAL HISTORY: Past Medical History:  Diagnosis Date  . Anxiety state, unspecified   . Breast cancer (HCC) 10/20/2004  . Dizziness and giddiness   . Malignant neoplasm of breast (female), unspecified site 7/06  . Personal history of chemotherapy 03/2005  . Personal history of radiation therapy 05/2005    PAST SURGICAL HISTORY: Past Surgical History:  Procedure Laterality Date  . BREAST BIOPSY     x 3---benign  . BREAST BIOPSY Right 10/20/2004   malignant  . BREAST EXCISIONAL BIOPSY Left   . BREAST LUMPECTOMY Right 04/2005  . PARTIAL HYSTERECTOMY     no cancer  . TONSILLECTOMY  Childhood    FAMILY HISTORY Family History  Problem Relation Age of Onset  . Heart disease Mother   . Hypertension Mother   . Hyperlipidemia Mother   . Cancer Father 61       lung, smoker  . Cancer Maternal Aunt        breast cancer  The patient's father died at age 46 from lung cancer in the setting of tobacco abuse. The patient's mother is living at age 80. The patient has  one sister, no brothers. The patient has multiple relatives with breast cancer on both sides of the family and was tested for the BRCA1 and 2 genes remotely and was found to be negative.  GYNECOLOGIC HISTORY:  No LMP recorded. Patient has had a hysterectomy. Menarche age  45, first live birth age 43, the patient is [name] P2. She underwent hysterectomy without salpingo-oophorectomy at age 54. She did not have hormone replacement.  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 Her [name] is a retired Airline pilot. He is now a used Teacher, early years/pre. Daughter [name] works in the nutrition department of the [employer] school system. Son [name] lives in [place] and is in Press photographer for [employer]. The patient has 3 granddaughters. She is a Psychologist, forensic.    ADVANCED DIRECTIVES:    HEALTH MAINTENANCE: Social History  Substance Use Topics  . Smoking status: Never Smoker  . Smokeless tobacco: Never Used  . Alcohol use No     Colonoscopy:Due 2018  PAP:  Bone density: Remote   Allergies  Allergen Reactions  . Sulfonamide Derivatives     REACTION: [place]    Current Outpatient Prescriptions  Medication Sig Dispense Refill  . calcium citrate-vitamin D (CITRACAL+D) 315-200 MG-UNIT per tablet Take 1 tablet by mouth 2 (two) times daily.      . sertraline (ZOLOFT) 50 MG tablet Take 1 tablet (50 mg total) by mouth daily. 30 tablet 3  . traZODone (DESYREL) 50 MG tablet Take 0.5-1 tablets (25-50 mg total) by mouth at bedtime as needed for sleep. 30 tablet 3   No current facility-administered medications for this visit.     OBJECTIVE: Middle-aged white woman who appears well Vitals:   09/15/16 1641  BP: (!) 141/75  Pulse: 71  Resp: 18  Temp: 97.5 F (36.4 C)     Body mass index is 27.27 kg/m.    ECOG FS:0 - Asymptomatic  Ocular: Sclerae unicteric, pupils equal, round and reactive to light Ear-nose-throat: Oropharynx clear and moist Lymphatic: No cervical or supraclavicular adenopathy Lungs no rales or rhonchi, good excursion bilaterally Heart regular rate and rhythm, no murmur appreciated Abd soft, nontender, positive bowel sounds MSK no focal spinal tenderness, no joint edema Neuro: non-focal, well-oriented,  appropriate affect Breasts: The right breast is status post prior lumpectomy and radiation. There is some scar tissue associated with the prior procedure but the exam is otherwise unrevealing and there are no skin or nipple changes of concern. The left breast is unremarkable. Both axillae are benign.   LAB RESULTS:  CMP     Component Value Date/Time   NA 140 08/12/2014 0858   K 4.3 08/12/2014 0858   CL 105 08/12/2014 0858   CO2 30 08/12/2014 0858   GLUCOSE 89 08/12/2014 0858   BUN 18 08/12/2014 0858   CREATININE 0.68 08/12/2014 0858   CALCIUM 9.6 08/12/2014 0858   PROT 6.8 08/12/2014 0858   ALBUMIN 4.2 08/12/2014 0858   AST 18 08/12/2014 0858   ALT 22 08/12/2014 0858   ALKPHOS 46 08/12/2014 0858   BILITOT 0.6 08/12/2014 0858    No results found for: TOTALPROTELP, ALBUMINELP, A1GS, A2GS, BETS, BETA2SER, GAMS, MSPIKE, SPEI  No results found for: KPAFRELGTCHN, LAMBDASER, KAPLAMBRATIO  Lab Results  Component Value Date   WBC 5.2 08/31/2010   NEUTROABS 2.7 08/31/2010   HGB 14.1 08/31/2010   HCT 41.7 08/31/2010   MCV 89.5 08/31/2010   PLT 177 08/31/2010  Chemistry      Component Value Date/Time   NA 140 08/12/2014 0858   K 4.3 08/12/2014 0858   CL 105 08/12/2014 0858   CO2 30 08/12/2014 0858   BUN 18 08/12/2014 0858   CREATININE 0.68 08/12/2014 0858      Component Value Date/Time   CALCIUM 9.6 08/12/2014 0858   ALKPHOS 46 08/12/2014 0858   AST 18 08/12/2014 0858   ALT 22 08/12/2014 0858   BILITOT 0.6 08/12/2014 0858       Lab Results  Component Value Date   LABCA2 22 08/31/2010    No components found for: DXIPJA250  No results for input(s): INR in the last 168 hours.  Urinalysis    Component Value Date/Time   COLORURINE lt. yellow 05/20/2010 1551   APPEARANCEUR Hazy 05/20/2010 1551   LABSPEC <1.005 05/20/2010 1551   PHURINE 6.0 05/20/2010 1551   HGBUR trace-lysed 05/20/2010 1551   BILIRUBINUR neg 08/03/2016 1033   KETONESUR negative  02/05/2015 0851   PROTEINUR neg 08/03/2016 1033   UROBILINOGEN 0.2 08/03/2016 1033   UROBILINOGEN 0.2 05/20/2010 1551   NITRITE neg 08/03/2016 1033   NITRITE negative 05/20/2010 1551   LEUKOCYTESUR Negative 08/03/2016 1033     STUDIES: Dg Lumbar Spine Complete  Result Date: 09/13/2016 CLINICAL DATA:  Acute midline low back pain without sciatica. EXAM: LUMBAR SPINE - COMPLETE 4+ VIEW COMPARISON:  CT scan of June 22, 2006. FINDINGS: There is no evidence of lumbar spine fracture. Alignment is normal. Intervertebral disc spaces are maintained. IMPRESSION: Normal lumbar spine. Electronically Signed   By: Marijo Conception, M.D.   On: 09/13/2016 10:41   Mm Digital Diagnostic Unilat R  Result Date: 08/23/2016 CLINICAL DATA:  Patient returns today to evaluate right breast calcifications identified on a recent screening mammogram. History of right breast cancer in 2007 status post breast conservation surgery. EXAM: DIGITAL DIAGNOSTIC RIGHT MAMMOGRAM WITH CAD COMPARISON:  Previous exams including recent screening mammogram dated 08/18/2016. ACR Breast Density Category c: The breast tissue is heterogeneously dense, which may obscure small masses. FINDINGS: On today's additional views, including magnification views, grouped pleomorphic and punctate calcifications are confirmed within the upper-outer quadrant of the right breast, at posterior depth, adjacent to the lumpectomy site, spanning 9 mm greatest extent. Additional grouped coarse and punctate calcifications are seen adjacent to the biopsy site, slightly more posterior and lateral to the aforementioned group of pleomorphic and punctate calcifications. Mammographic images were processed with CAD. IMPRESSION: 1. Grouped pleomorphic and punctate calcifications within the upper-outer quadrant of the right breast, at posterior depth, located adjacent to the lumpectomy site, spanning 9 mm greatest extent. This may represent developing fat necrosis, however, this  is a suspicious finding for which stereotactic biopsy is recommended. 2. Addition grouped coarse and punctate calcifications located in the slightly more posterior and lateral portion of the right breast. RECOMMENDATION: 1. Stereotactic biopsy for the grouped pleomorphic and punctate calcifications located within the upper-outer quadrant of the right breast, adjacent to the lumpectomy site, spanning 9 mm. 2. If pathology result is benign, recommend six-month follow-up diagnostic mammogram for the additional coarser calcifications located in the slightly more posterior and lateral portion of the right breast. Stereotactic biopsy is scheduled for May 10th. I have discussed the findings and recommendations with the patient. Results were also provided in writing at the conclusion of the visit. If applicable, a reminder letter will be sent to the patient regarding the next appointment. BI-RADS CATEGORY  4: Suspicious. Electronically Signed  By: Franki Cabot M.D.   On: 08/23/2016 12:20   Mm Screening Breast Tomo Bilateral  Result Date: 08/18/2016 CLINICAL DATA:  Screening. EXAM: 2D DIGITAL SCREENING BILATERAL MAMMOGRAM WITH CAD AND ADJUNCT TOMO COMPARISON:  Previous exam(s). ACR Breast Density Category c: The breast tissue is heterogeneously dense, which may obscure small masses. FINDINGS: In the right breast, calcifications warrant further evaluation with magnified views. In the left breast, no findings suspicious for malignancy. Images were processed with CAD. IMPRESSION: Further evaluation is suggested for calcifications in the right breast. RECOMMENDATION: Diagnostic mammogram of the right breast. (Code:[id number]) The patient will be contacted regarding the findings, and additional imaging will be scheduled. BI-RADS CATEGORY  0: Incomplete. Need additional imaging evaluation and/or prior mammograms for comparison. Electronically Signed   By: Marin Olp M.D.   On: 08/18/2016 09:52   Mm Clip Placement  Right  Result Date: 08/25/2016 CLINICAL DATA:  Status post stereotactic guided core needle biopsy of a 9 mm group of calcifications in the posterior aspect of the upper-outer quadrant of the right breast. EXAM: DIAGNOSTIC RIGHT MAMMOGRAM POST STEREOTACTIC BIOPSY COMPARISON:  Previous exam(s). FINDINGS: Mammographic images were obtained following stereotactic guided biopsy of the recently demonstrated 9 mm group of calcifications in the posterior aspect of the upper-outer quadrant of the right breast. These demonstrate an X shaped biopsy marker clip at the location of the targeted and biopsied calcifications. There are residual calcifications at that location as well as previously demonstrated nearby more benign-appearing, coarse calcifications. IMPRESSION: Appropriate clip deployment following right breast stereotactic guided core needle biopsy. Final Assessment: Post Procedure Mammograms for Marker Placement Electronically Signed   By: Claudie Revering M.D.   On: 08/25/2016 10:43   Mm Rt Breast Bx W Loc Dev 1st Lesion Image Bx Spec Stereo Guide  Addendum Date: 08/26/2016   ADDENDUM REPORT: 08/26/2016 10:10 ADDENDUM: Pathology revealed intermediate grade ductal carcinoma in situ with calcifications in the right breast. This was found to be concordant by Dr. Claudie Revering. Pathology results were discussed with the patient by telephone. The patient reported doing well after the biopsy. Post biopsy instructions and care were reviewed and questions were answered. The patient was encouraged to call [facility] for any additional concerns. Surgical consultation has been arranged with Dr. Fanny Skates at [facility] on Sep 01, 2016. Pathology results reported by Susa Raring RN, BSN on 08/26/2016. Electronically Signed   By: Claudie Revering M.D.   On: 08/26/2016 10:10   Result Date: 08/26/2016 CLINICAL DATA:  9 mm group of indeterminate calcifications in the upper-outer  quadrant of the right breast adjacent to the lumpectomy bed. Status post right lumpectomy and radiation therapy for breast cancer in 2007. The patient also had chemotherapy. EXAM: RIGHT BREAST STEREOTACTIC CORE NEEDLE BIOPSY COMPARISON:  Previous exams. FINDINGS: The patient and I discussed the procedure of stereotactic-guided biopsy including benefits and alternatives. We discussed the high likelihood of a successful procedure. We discussed the risks of the procedure including infection, bleeding, tissue injury, clip migration, and inadequate sampling. Informed written consent was given. The usual time out protocol was performed immediately prior to the procedure. Using sterile technique and 1% Lidocaine as local anesthetic, under stereotactic guidance, a 9 gauge vacuum assisted device was used to perform core needle biopsy of the recently demonstrated 9 mm group of calcifications in the posterior aspect of the upper-outer quadrant of the right breast using a lateral approach. Specimen radiograph was performed showing multiple calcifications  within multiple specimens. Specimens with calcifications are identified for pathology. Lesion quadrant: Upper outer quadrant At the conclusion of the procedure, a X shaped tissue marker clip was deployed into the biopsy cavity. Follow-up 2-view mammogram was performed and dictated separately. IMPRESSION: Stereotactic-guided biopsy of a 9 mm group of calcifications in the upper-outer quadrant of the right breast. No apparent complications. Electronically Signed: By: Claudie Revering M.D. On: 08/25/2016 10:33    ELIGIBLE FOR AVAILABLE RESEARCH PROTOCOL: No  ASSESSMENT: 61 y.o. BRCA negative [place] woman  (1) status post right lumpectomy and sentinel lymph node sampling in 2007 (records review pending)  (a) status post neoadjuvant tamoxifen with no significant response  (b) status post adjuvant chemotherapy  (c) status post adjuvant radiation  (d) status  post adjuvant exemestane for 5 years, completed 2012  (2) status post right breast upper outer quadrant biopsy 08/25/2016 for ductal carcinoma in situ, grade 2, estrogen and progesterone receptor positive  (3) right mastectomy pending  (4) tamoxifen started 09/15/2016 in anticipation of surgical delays  (5) further genetics testing pending  (6) the patient is contemplating multiple possible reconstruction options   PLAN: We spent the better part of today's hour-long appointment discussing the biology of breast cancer in general, and the specifics of the patient's tumor in particular. Elma understands that in noninvasive ductal carcinoma, also called ductal carcinoma in situ ("DCIS") the breast cancer cells remain trapped in the ducts were they started. They cannot travel to a vital organ. For that reason these cancers in themselves are not life-threatening.  If the whole breast is removed then all the ducts are removed and since the cancer cells are trapped in the ducts, the cure rate with mastectomy for noninvasive breast cancer is approximately 99%. This is relevant to Verlisa's case, because her right breast has already undergone radiation. The standard of care in this situation is indeed mastectomy.   Complicating this simple decision, is the fact that T[place]eny does have a significant family history of breast cancer. She has been found to be BRCA 1 and 2 negative, but these days we are doing a broader testing panel. She will want to know the results of that before making a definitive surgical decision and this may delay the final procedure  She understands that if she does carry a deleterious mutation the risk of a new breast cancer developing in the future may be sufficiently great that the patient may choose bilateral mastectomies. However if she wishes to keep her breasts in that situation it is safe to do so. That would require intensified screening, which generally means not only yearly  mammography but a yearly breast MRI as well. Of course, if there is a deleterious mutation bilateral oophorectomy May be necessary as there is no standard screening protocol for ovarian cancer.  Given these uncertainties and likely delays we are starting her on tamoxifen now. This means she is receiving systemic therapy for her a problem. Once she has her mastectomy, she will not need antiestrogen's since as discussed today the cure rate for ductal carcinoma in situ with mastectomy approaches 100%  Louvenia has a good understanding of the overall plan. She agrees with it. She knows the goal of treatment in her case is cure. She will call with any problems that may develop before her next visit here.  Chauncey Cruel, MD   09/15/2016 4:53 PM Medical Oncology and Hematology [facility] [address] [place], [place] [address] Tel. [phone]  Fax. [phone]  Addendum: I have obtained my prior notes and [place] to review her prior treatment. This showed "she underwent right lumpectomy and sentinel lymph node sampling January 2007 after neoadjuvant treatment with tamoxifen and Herceptin. Her Oncotype score at presentation showed an intermediate grade with a recurrence rate of 14% outside the breast over 10 years if she took to 5 years of tamoxifen. At presentation the tumor was 2.2 cm, pathologically at the time of surgery it was Y PT 1Y PN 0. She received a total of one year of Herceptin completed February 2008. She received 8 doses of Taxol completed in December 2007. She then received adjuvant radiation and this was followed by exemestane started May 2007 and completed May 2012.

## 2016-09-16 ENCOUNTER — Telehealth: Payer: Self-pay | Admitting: *Deleted

## 2016-09-16 ENCOUNTER — Telehealth: Payer: Self-pay

## 2016-09-16 DIAGNOSIS — Z170 Estrogen receptor positive status [ER+]: Principal | ICD-10-CM

## 2016-09-16 DIAGNOSIS — C50811 Malignant neoplasm of overlapping sites of right female breast: Secondary | ICD-10-CM | POA: Insufficient documentation

## 2016-09-16 DIAGNOSIS — D0511 Intraductal carcinoma in situ of right breast: Secondary | ICD-10-CM | POA: Insufficient documentation

## 2016-09-16 MED ORDER — TAMOXIFEN CITRATE 20 MG PO TABS: 20.0000 mg | ORAL_TABLET | Freq: Every day | ORAL | 12 refills | Status: AC

## 2016-09-16 NOTE — Telephone Encounter (Signed)
Pt's daughter was notified, she said she will bring FMLA papers on Monday.

## 2016-09-16 NOTE — Telephone Encounter (Signed)
  Oncology Nurse Navigator Documentation  Navigator Location: [facility] (09/16/16 1000)   )Navigator Encounter Type: Telephone (09/16/16 1000) Telephone: Outgoing Call;FMLA/Disability (09/16/16 1000)                                                  Time Spent with Patient: 15 (09/16/16 1000)

## 2016-09-16 NOTE — Telephone Encounter (Signed)
I will complete the paperwork for medical leave.. Have her bring FMLA papers by.

## 2016-09-16 NOTE — Telephone Encounter (Signed)
Caryl Pina pts daughter (do not see DPR) left v/m; pt is struggling now;pt saw [place] doctor, Dr Jana Hakim and was advised that pt could be written out of work by Dr Jana Hakim; pts work is stressful and pt was told by employee at Dr Jana Hakim that she could not fill out paper work. Pt will have to get surgeon to fill out paperwork for being written out of work and pt does not see surgeon  until 10/10/16. Caryl Pina wants to know if there is anything Dr Diona Browner can do to write pt out of work due to depression or any dx. Caryl Pina has called to speak with Dr Magrinat's nurse about Dr Jana Hakim saying he would write pt out of work but has not gotten cb yet. Caryl Pina request cb.

## 2016-09-19 ENCOUNTER — Encounter: Payer: Self-pay | Admitting: Genetic Counselor

## 2016-09-19 ENCOUNTER — Ambulatory Visit (HOSPITAL_BASED_OUTPATIENT_CLINIC_OR_DEPARTMENT_OTHER): Payer: BC Managed Care – PPO | Admitting: Genetic Counselor

## 2016-09-19 ENCOUNTER — Other Ambulatory Visit: Payer: BC Managed Care – PPO

## 2016-09-19 DIAGNOSIS — Z801 Family history of malignant neoplasm of trachea, bronchus and lung: Secondary | ICD-10-CM | POA: Diagnosis not present

## 2016-09-19 DIAGNOSIS — Z170 Estrogen receptor positive status [ER+]: Principal | ICD-10-CM

## 2016-09-19 DIAGNOSIS — Z803 Family history of malignant neoplasm of breast: Secondary | ICD-10-CM | POA: Diagnosis not present

## 2016-09-19 DIAGNOSIS — C50811 Malignant neoplasm of overlapping sites of right female breast: Secondary | ICD-10-CM

## 2016-09-19 DIAGNOSIS — D0511 Intraductal carcinoma in situ of right breast: Secondary | ICD-10-CM

## 2016-09-19 DIAGNOSIS — Z7183 Encounter for nonprocreative genetic counseling: Secondary | ICD-10-CM | POA: Diagnosis not present

## 2016-09-19 NOTE — Progress Notes (Signed)
REFERRING PROVIDER: Chauncey Cruel, MD [address] [place], [place] [address]  PRIMARY PROVIDER:  Jinny Sanders, MD  PRIMARY REASON FOR VISIT:  1. 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 Family history of breast cancer   3. Ductal carcinoma in situ (DCIS) of right breast      HISTORY OF PRESENT ILLNESS:   Lisa Salinas, a 61 y.o. female, was seen for a [place] cancer genetics consultation at the request of Dr. Jana Hakim due to a personal and family history of cancer.  Lisa Salinas presents to clinic today to discuss the possibility of a hereditary predisposition to cancer, genetic testing, and to further clarify her future cancer risks, as well as potential cancer risks for family members.   In 2007, at the age of 60, Lisa Salinas was diagnosed with cancer of the right breast. This was treated with lumpectomy, chemotherapy, radiation and tamoxifen.  In 2018, at the age of 31, Lisa Salinas was diagnosed with cancer of the right breast.  She is waiting on the results of genetic testing to determine surgical plan.     CANCER HISTORY:   No history exists.     HORMONAL RISK FACTORS:  Menarche was at age 81.  First live birth at age 41.  OCP use for approximately 3-4 years.  Ovaries intact: yes.  Hysterectomy: yes.  Menopausal status: postmenopausal.  HRT use: 0 years. Colonoscopy: yes; 3-4 colon polyps. Mammogram within the last year: yes. Number of breast biopsies: 5. Up to date with pelvic exams:  no. Any excessive radiation exposure in the past:  Just with previous dx of breast cancer  Past Medical History:  Diagnosis Date  . Anxiety state, unspecified   . Breast cancer ([place]) 10/20/2004  . Dizziness and giddiness   . Family history of breast cancer   . Malignant neoplasm of breast (female), unspecified site 7/06  . Personal history of chemotherapy 03/2005  . Personal history of radiation  therapy 05/2005    Past Surgical History:  Procedure Laterality Date  . BREAST BIOPSY     x 3---benign  . BREAST BIOPSY Right 10/20/2004   malignant  . BREAST EXCISIONAL BIOPSY Left   . BREAST LUMPECTOMY Right 04/2005  . PARTIAL HYSTERECTOMY     no cancer  . TONSILLECTOMY  Childhood    Social History   Social History  . Marital status: Married    Spouse name: N/A  . Number of children: N/A  . Years of education: N/A   Social History Main Topics  . Smoking status: Never Smoker  . Smokeless tobacco: Never Used  . Alcohol use No  . Drug use: No  . Sexual activity: Not Asked   Other Topics Concern  . None   Social History Narrative   MArried, [name]    2 kids age 33 and 43     FAMILY HISTORY:  We obtained a detailed, 4-generation family history.  Significant diagnoses are listed below: Family History  Problem Relation Age of Onset  . Heart disease Mother   . Hypertension Mother   . Hyperlipidemia Mother   . Cancer Father 67       lung, smoker  . Breast cancer Maternal Aunt 48  . Breast cancer Paternal Aunt 81  . Breast cancer Maternal Aunt 66  . Breast cancer Cousin 8       paternal first cousin    The patient has a  son and daughter who are cancer free.  She has one sister, from whom she has been estranged for 2 1/2 years, who is cancer free as far as she is aware.  The patient's mother is alive at age 69.  She has never had cancer.  She had three sisters.  Two sisters had breast cancer, one at age 13 and another at age 72.  One sister had a son with prostate cancer at 28.  There is no other reported  cancer history on this side of the family.  The patient's father died of lung cancer at age 65.  He had five brothers and two sisters.  One sister had breast cancer at 30 and a brother has a daughter with breast cancer at age 32.  There is no other reported family history of cancer.  Lisa Salinas is unaware of previous family history of genetic testing for  hereditary cancer risks. Patient's maternal ancestors are of Caucasian descent, and paternal ancestors are of Caucasian descent. There is no reported Ashkenazi Jewish ancestry. There is no known consanguinity.  GENETIC COUNSELING ASSESSMENT: Lisa Salinas is a 61 y.o. female with a personal history of two breast cancers and a family history of breast cancer which is somewhat suggestive of a hereditary cancer syndrome and predisposition to cancer. We, therefore, discussed and recommended the following at today's visit.   DISCUSSION: We discussed that about 5-10% of breast cancer is hereditary with most cases due to BRCA mutations.  Other hereditary genes associated with breast cancer syndromes include ATM, CHEK2 and PALB2.  We reviewed the characteristics, features and inheritance patterns of hereditary cancer syndromes. We also discussed genetic testing, including the appropriate family members to test, the process of testing, insurance coverage and turn-around-time for results. We discussed the implications of a negative, positive and/or variant of uncertain significant result. We recommended Lisa Salinas pursue genetic testing for the Common Hereditary Cancer gene panel. The Hereditary Gene Panel offered by [facility] includes sequencing and/or deletion duplication testing of the following 46 genes: APC, ATM, AXIN2, BARD1, BMPR1A, BRCA1, BRCA2, BRIP1, CDH1, CDKN2A (p14ARF), CDKN2A (p16INK4a), CHEK2, CTNNA1, DICER1, EPCAM (Deletion/duplication testing only), GREM1 (promoter region deletion/duplication testing only), KIT, MEN1, MLH1, MSH2, MSH3, MSH6, MUTYH, NBN, NF1, NHTL1, PALB2, PDGFRA, PMS2, POLD1, POLE, PTEN, RAD50, RAD51C, RAD51D, SDHB, SDHC, SDHD, SMAD4, SMARCA4. STK11, TP53, TSC1, TSC2, and VHL.  The following genes were evaluated for sequence changes only: SDHA and HOXB13 c.251G>A variant only.  Based on Lisa Salinas's personal and family history of cancer, she meets medical criteria for genetic  testing. Despite that she meets criteria, she may still have an out of pocket cost. We discussed that if her out of pocket cost for testing is over $100, the laboratory will call and confirm whether she wants to proceed with testing.  If the out of pocket cost of testing is less than $100 she will be billed by the genetic testing laboratory.   We discussed that some people do not want to undergo genetic testing due to fear of genetic discrimination.  A federal law called the Genetic Information Non-Discrimination Act (GINA) of 2008 helps protect individuals against genetic discrimination based on their genetic test results.  It impacts both health insurance and employment.  With health insurance, it protects against increased premiums, being kicked off insurance or being forced to take a test in order to be insured.  For employment it protects against hiring, firing and promoting decisions based on genetic test results.  Health  status due to a cancer diagnosis is not protected under GINA.  PLAN: After considering the risks, benefits, and limitations, Lisa Salinas  provided informed consent to pursue genetic testing and the blood sample was sent to [facility] for analysis of the Common Hereditary Cancer Panel. Results should be available within approximately 2-3 weeks' time, at which point they will be disclosed by telephone to Lisa Salinas, as will any additional recommendations warranted by these results. Lisa Salinas will receive a summary of her genetic counseling visit and a copy of her results once available. This information will also be available in Epic. We encouraged Lisa Salinas to remain in contact with cancer genetics annually so that we can continuously update the family history and inform her of any changes in cancer genetics and testing that may be of benefit for her family. Lisa Salinas questions were answered to her satisfaction today. Our contact information was provided should  additional questions or concerns arise.  Lastly, we encouraged Lisa Salinas to remain in contact with cancer genetics annually so that we can continuously update the family history and inform her of any changes in cancer genetics and testing that may be of benefit for this family.   Ms.  Salinas questions were answered to her satisfaction today. Our contact information was provided should additional questions or concerns arise. Thank you for the referral and allowing [place] to share in the care of your patient.   Lisa Salinas P. Florene Glen, [place], [facility] Certified Genetic Counselor [email or web address] phone: [phone]  The patient was seen for a total of 45 minutes in face-to-face genetic counseling.  This patient was discussed with Drs. Magrinat, Lindi Adie and/or Burr Medico who agrees with the above.    _______________________________________________________________________ For Office Staff:  Number of people involved in session: 1 Was an Intern/ student involved with case: no

## 2016-09-20 NOTE — Telephone Encounter (Signed)
Paperwork in Dr Diona Browner IN BOX For review and signature

## 2016-09-21 NOTE — Telephone Encounter (Signed)
Spoke with patient, she is very upset that her ppw is not ready for pickup.  She states that she was told by clinical staff, it would be ready ASAP.  She is not happy that the PPW is still not available for pickup.  Explained that the PPW was completed within 4 days instead of the usual 2 week turnaround. Waiting for review by PCP.  Pt not ok with waiting, checked with 2 providers, they recommend waiting for PCP.  Called pt back and left her a message, we will call when ready for pickup.

## 2016-09-23 DIAGNOSIS — Z7689 Persons encountering health services in other specified circumstances: Secondary | ICD-10-CM

## 2016-09-23 NOTE — Telephone Encounter (Signed)
In outbox, ready for pick up.  Cc: Mal Amabile and linda

## 2016-09-23 NOTE — Telephone Encounter (Signed)
Copy for pt Copy for file Copy for scan Copy for billing  Pt will turn in paperwork Left message letting pt know paperwork is ready for pick up

## 2016-09-27 ENCOUNTER — Other Ambulatory Visit: Payer: Self-pay | Admitting: General Surgery

## 2016-09-27 DIAGNOSIS — C50911 Malignant neoplasm of unspecified site of right female breast: Secondary | ICD-10-CM

## 2016-09-28 ENCOUNTER — Encounter: Payer: Self-pay | Admitting: Genetic Counselor

## 2016-09-28 ENCOUNTER — Telehealth: Payer: Self-pay | Admitting: Genetic Counselor

## 2016-09-28 NOTE — Telephone Encounter (Signed)
Revealed negative genetic testing.  Discussed that we do not know why she has breast cancer or why there is cancer in the family. It could be due to a different gene that we are not testing, or maybe our current technology may not be able to pick something up.  It will be important for her to keep in contact with genetics to keep up with whether additional testing may be needed. 

## 2016-09-28 NOTE — Telephone Encounter (Signed)
LM on VM with good news.  I will try to reach her on Friday.

## 2016-09-30 ENCOUNTER — Ambulatory Visit: Payer: Self-pay | Admitting: Genetic Counselor

## 2016-09-30 DIAGNOSIS — C50811 Malignant neoplasm of overlapping sites of right female breast: Secondary | ICD-10-CM

## 2016-09-30 DIAGNOSIS — Z803 Family history of malignant neoplasm of breast: Secondary | ICD-10-CM

## 2016-09-30 DIAGNOSIS — Z170 Estrogen receptor positive status [ER+]: Secondary | ICD-10-CM

## 2016-09-30 DIAGNOSIS — Z1379 Encounter for other screening for genetic and chromosomal anomalies: Secondary | ICD-10-CM

## 2016-09-30 NOTE — Progress Notes (Signed)
HPI: Lisa Salinas was previously seen in the [facility] clinic due to a personal and family history of cancer and concerns regarding a hereditary predisposition to cancer. Please refer to our prior cancer genetics clinic note for more information regarding Lisa Salinas's medical, social and family histories, and our assessment and recommendations, at the time. Lisa Salinas recent genetic test results were disclosed to her, as were recommendations warranted by these results. These results and recommendations are discussed in more detail below.  CANCER HISTORY:    Malignant neoplasm of overlapping sites of right breast in female, estrogen receptor positive ([place])   09/16/2016 Initial Diagnosis    Malignant neoplasm of overlapping sites of right breast in female, estrogen receptor positive ([place])     09/27/2016 Genetic Testing    Negative genetic testing on the Common Hereditary cancer panel.  The Hereditary Gene Panel offered by [facility] includes sequencing and/or deletion duplication testing of the following 46 genes: APC, ATM, AXIN2, BARD1, BMPR1A, BRCA1, BRCA2, BRIP1, CDH1, CDKN2A (p14ARF), CDKN2A (p16INK4a), CHEK2, CTNNA1, DICER1, EPCAM (Deletion/duplication testing only), GREM1 (promoter region deletion/duplication testing only), KIT, MEN1, MLH1, MSH2, MSH3, MSH6, MUTYH, NBN, NF1, NHTL1, PALB2, PDGFRA, PMS2, POLD1, POLE, PTEN, RAD50, RAD51C, RAD51D, SDHB, SDHC, SDHD, SMAD4, SMARCA4. STK11, TP53, TSC1, TSC2, and VHL.  The following genes were evaluated for sequence changes only: SDHA and HOXB13 c.251G>A variant only.  The report date is September 27, 2016.        FAMILY HISTORY:  We obtained a detailed, 4-generation family history.  Significant diagnoses are listed below: Family History  Problem Relation Age of Onset  . Heart disease Mother   . Hypertension Mother   . Hyperlipidemia Mother   . Cancer Father 59       lung, smoker  . Breast cancer Maternal Aunt 42  . Breast cancer  Paternal Aunt 55  . Breast cancer Maternal Aunt 23  . Breast cancer Cousin 70       paternal first cousin    The patient has a son and daughter who are cancer free.  She has one sister, from whom she has been estranged for 2 1/2 years, who is cancer free as far as she is aware.  The patient's mother is alive at age 45.  She has never had cancer.  She had three sisters.  Two sisters had breast cancer, one at age 67 and another at age 45.  One sister had a son with prostate cancer at 78.  There is no other reported  cancer history on this side of the family.  The patient's father died of lung cancer at age 1.  He had five brothers and two sisters.  One sister had breast cancer at 53 and a brother has a daughter with breast cancer at age 50.  There is no other reported family history of cancer.  Lisa Salinas is unaware of previous family history of genetic testing for hereditary cancer risks. Patient's maternal ancestors are of Caucasian descent, and paternal ancestors are of Caucasian descent. There is no reported Ashkenazi Jewish ancestry. There is no known consanguinity.  GENETIC TEST RESULTS: Genetic testing reported out on September 28, 2016 through the Hereditary cancer panel found no deleterious mutations.  The Hereditary Gene Panel offered by [facility] includes sequencing and/or deletion duplication testing of the following 46 genes: APC, ATM, AXIN2, BARD1, BMPR1A, BRCA1, BRCA2, BRIP1, CDH1, CDKN2A (p14ARF), CDKN2A (p16INK4a), CHEK2, CTNNA1, DICER1, EPCAM (Deletion/duplication testing only), GREM1 (promoter region deletion/duplication testing only), KIT,  MEN1, MLH1, MSH2, MSH3, MSH6, MUTYH, NBN, NF1, NHTL1, PALB2, PDGFRA, PMS2, POLD1, POLE, PTEN, RAD50, RAD51C, RAD51D, SDHB, SDHC, SDHD, SMAD4, SMARCA4. STK11, TP53, TSC1, TSC2, and VHL.  The following genes were evaluated for sequence changes only: SDHA and HOXB13 c.251G>A variant only.  The test report has been scanned into EPIC and is located under the  Molecular Pathology section of the Results Review tab.   We discussed with Lisa Salinas that since the current genetic testing is not perfect, it is possible there may be a gene mutation in one of these genes that current testing cannot detect, but that chance is small. We also discussed, that it is possible that another gene that has not yet been discovered, or that we have not yet tested, is responsible for the cancer diagnoses in the family, and it is, therefore, important to remain in touch with cancer genetics in the future so that we can continue to offer Lisa Salinas the most up to date genetic testing.      CANCER SCREENING RECOMMENDATIONS:  This result is reassuring and indicates that Lisa Salinas likely does not have an increased risk for a future cancer due to a mutation in one of these genes. This normal test also suggests that Lisa Salinas's cancer was most likely not due to an inherited predisposition associated with one of these genes.  Most cancers happen by chance and this negative test suggests that her cancer falls into this category.  We, therefore, recommended she continue to follow the cancer management and screening guidelines provided by her oncology and primary healthcare provider.   RECOMMENDATIONS FOR FAMILY MEMBERS: Women in this family might be at some increased risk of developing cancer, over the general population risk, simply due to the family history of cancer. We recommended women in this family have a yearly mammogram beginning at age 44, or 21 years younger than the earliest onset of cancer, an annual clinical breast exam, and perform monthly breast self-exams. Women in this family should also have a gynecological exam as recommended by their primary provider. All family members should have a colonoscopy by age 80.  FOLLOW-UP: Lastly, we discussed with Lisa Salinas that cancer genetics is a rapidly advancing field and it is possible that new genetic tests will be  appropriate for her and/or her family members in the future. We encouraged her to remain in contact with cancer genetics on an annual basis so we can update her personal and family histories and let her know of advances in cancer genetics that may benefit this family.   Our contact number was provided. Lisa Salinas questions were answered to her satisfaction, and she knows she is welcome to call us at anytime with additional questions or concerns.   Roma Kayser, MS, [facility] Certified Genetic Counselor [email or web address]

## 2016-10-13 NOTE — Pre-Procedure Instructions (Signed)
Lisa Salinas  10/13/2016      [facility] - [place], [place] - [address] [place] [address] Phone: [phone] Fax: [phone]    Your procedure is scheduled on July 2  Report to [facility] at 830 A.M.  Call this number if you have problems the morning of surgery:  [phone]   Remember:  Do not eat food or drink liquids after midnight.  Take these medicines the morning of surgery with A SIP OF WATER sertraline (Zoloft), Tamoxifen (Nolvadex)  Stop taking aspirin, BC's, Goody's, herbal medications, Fish Oil, Ibuprofen, Advil, Motrin, Aleve, vitamins  Drink Boost as directed    Do not wear jewelry, make-up or nail polish.  Do not wear lotions, powders, or perfumes, or deoderant.  Do not shave 48 hours prior to surgery.  Men may shave face and neck.  Do not bring valuables to the hospital.  [facility] is not responsible for any belongings or valuables.  Contacts, dentures or bridgework may not be worn into surgery.  Leave your suitcase in the car.  After surgery it may be brought to your room.  For patients admitted to the hospital, discharge time will be determined by your treatment team.  Patients discharged the day of surgery will not be allowed to drive home.  Special instructions:  [place] - Preparing for Surgery  Before surgery, you can play an important role.  Because skin is not sterile, your skin needs to be as free of germs as possible.  You can reduce the number of germs on you skin by washing with CHG (chlorahexidine gluconate) soap before surgery.  CHG is an antiseptic cleaner which kills germs and bonds with the skin to continue killing germs even after washing.  Please DO NOT use if you have an allergy to CHG or antibacterial soaps.  If your skin becomes reddened/irritated stop using the CHG and inform your nurse when you arrive at Short Stay.  Do not shave (including legs  and underarms) for at least 48 hours prior to the first CHG shower.  You may shave your face.  Please follow these instructions carefully:   1.  Shower with CHG Soap the night before surgery and the                                morning of Surgery.  2.  If you choose to wash your hair, wash your hair first as usual with your       normal shampoo.  3.  After you shampoo, rinse your hair and body thoroughly to remove the                      Shampoo.  4.  Use CHG as you would any other liquid soap.  You can apply chg directly       to the skin and wash gently with scrungie or a clean washcloth.  5.  Apply the CHG Soap to your body ONLY FROM THE NECK DOWN.        Do not use on open wounds or open sores.  Avoid contact with your eyes,       ears, mouth and genitals (private parts).  Wash genitals (private parts)       with your normal soap.  6.  Wash thoroughly, paying special attention to  the area where your surgery        will be performed.  7.  Thoroughly rinse your body with warm water from the neck down.  8.  DO NOT shower/wash with your normal soap after using and rinsing off       the CHG Soap.  9.  Pat yourself dry with a clean towel.            10.  Wear clean pajamas.            11.  Place clean sheets on your bed the night of your first shower and do not        sleep with pets.  Day of Surgery  Do not apply any lotions/deoderants the morning of surgery.  Please wear clean clothes to the hospital/surgery center.     Please read over the following fact sheets that you were given. Pain Booklet, Coughing and Deep Breathing and Surgical Site Infection Prevention

## 2016-10-14 ENCOUNTER — Encounter (HOSPITAL_COMMUNITY): Payer: Self-pay

## 2016-10-14 ENCOUNTER — Encounter (HOSPITAL_COMMUNITY)
Admission: RE | Admit: 2016-10-14 | Discharge: 2016-10-14 | Disposition: A | Discharge status: Home / Self Care | Payer: BC Managed Care – PPO | Source: Ambulatory Visit | Attending: General Surgery | Admitting: General Surgery

## 2016-10-14 DIAGNOSIS — Z803 Family history of malignant neoplasm of breast: Secondary | ICD-10-CM | POA: Diagnosis not present

## 2016-10-14 DIAGNOSIS — Z853 Personal history of malignant neoplasm of breast: Secondary | ICD-10-CM | POA: Diagnosis not present

## 2016-10-14 DIAGNOSIS — D0511 Intraductal carcinoma in situ of right breast: Secondary | ICD-10-CM | POA: Diagnosis not present

## 2016-10-14 DIAGNOSIS — F329 Major depressive disorder, single episode, unspecified: Secondary | ICD-10-CM | POA: Diagnosis not present

## 2016-10-14 DIAGNOSIS — Z79899 Other long term (current) drug therapy: Secondary | ICD-10-CM | POA: Diagnosis not present

## 2016-10-14 DIAGNOSIS — F419 Anxiety disorder, unspecified: Secondary | ICD-10-CM | POA: Diagnosis not present

## 2016-10-14 HISTORY — DX: Major depressive disorder, single episode, unspecified: F32.9

## 2016-10-14 HISTORY — DX: Cardiac arrhythmia, unspecified: I49.9

## 2016-10-14 HISTORY — DX: Depression, unspecified: F32.A

## 2016-10-14 LAB — CBC WITH DIFFERENTIAL/PLATELET
BASOS ABS: 0 10*3/uL (ref 0.0–0.1)
BASOS PCT: 0 %
EOS PCT: 2 %
Eosinophils Absolute: 0.1 10*3/uL (ref 0.0–0.7)
HCT: 40.6 % (ref 36.0–46.0)
Hemoglobin: 13.4 g/dL (ref 12.0–15.0)
Lymphocytes Relative: 44 %
Lymphs Abs: 2.5 10*3/uL (ref 0.7–4.0)
MCH: 30.2 pg (ref 26.0–34.0)
MCHC: 33 g/dL (ref 30.0–36.0)
MCV: 91.6 fL (ref 78.0–100.0)
MONO ABS: 0.3 10*3/uL (ref 0.1–1.0)
Monocytes Relative: 5 %
NEUTROS ABS: 2.7 10*3/uL (ref 1.7–7.7)
Neutrophils Relative %: 49 %
PLATELETS: 151 10*3/uL (ref 150–400)
RBC: 4.43 MIL/uL (ref 3.87–5.11)
RDW: 13 % (ref 11.5–15.5)
WBC: 5.6 10*3/uL (ref 4.0–10.5)

## 2016-10-14 LAB — COMPREHENSIVE METABOLIC PANEL
ALBUMIN: 4.2 g/dL (ref 3.5–5.0)
ALT: 16 U/L (ref 14–54)
AST: 19 U/L (ref 15–41)
Alkaline Phosphatase: 42 U/L (ref 38–126)
Anion gap: 7 (ref 5–15)
BUN: 12 mg/dL (ref 6–20)
CHLORIDE: 105 mmol/L (ref 101–111)
CO2: 27 mmol/L (ref 22–32)
Calcium: 9.2 mg/dL (ref 8.9–10.3)
Creatinine, Ser: 0.79 mg/dL (ref 0.44–1.00)
GFR calc Af Amer: >60 mL/min (ref >60)
GFR calc non Af Amer: >60 mL/min (ref >60)
GLUCOSE: 117 mg/dL — AB (ref 65–99)
POTASSIUM: 3.6 mmol/L (ref 3.5–5.1)
Sodium: 139 mmol/L (ref 135–145)
Total Bilirubin: 0.5 mg/dL (ref 0.3–1.2)
Total Protein: 6.7 g/dL (ref 6.5–8.1)

## 2016-10-14 NOTE — Progress Notes (Addendum)
PCP is Dr. Eliezer Lofts States she saw a cardiologist many years ago, doesn't remember the Drs name. She saw the Dr due to palpitations. States everything checked out ok.  C/O pain in upper mid gastric area, she states she only notices it while washing dishes or wrapping presents.  She just has started to notice this, but no pain today. States once she had pain in same location and she belched, then pain went away. Has not let her Dr know yet. Denies any cough, or fever. Ebony Hail called and informed. EKG ordered. Instructed to drink Boost by 0630 on day of surgery. Voices understanding.

## 2016-10-14 NOTE — Progress Notes (Signed)
Anesthesia PAT Evaluation: Patient is a 61 year old female scheduled for right mastectomy with SN lymph node biopsy on 10/17/16 by Dr. Dalbert Batman.   History includes never smoker, depression, anxiety, dizziness (resolved; treated for vertigo and sinus issues in the past), right breast cancer '06 s/p chemoradiation with DCIS by biopsy 08/25/16, dysrhythmia (occasional palpitations without associated symptoms).  PCP is Dr. Eliezer Lofts, last visit 09/13/16. HEM-ONC is Dr. Lurline Del.  Meds include Zoloft, tamoxifen, trazodone.   BP 121/72   Pulse 73   Temp 36.6 C (Oral)   Resp 18   Ht 5' 4.5" (1.638 m)   Wt 160 lb 14.4 oz (73 kg)   SpO2 99%   BMI 27.19 kg/m  Patient A&O, NAD. No conversational dyspnea. She denied syncope, persistent or prolonged palpitations, edema, SOB. She denied chest pain but did report localized epigastric pain on occasion when she is doing activities that involve her standing and working with her hands in a bent over position (ie, folding clothes, washing dishes, wrapping presents). Feels "sore" like a "large marker" is being pressed into her epigastric region. There is a little relief with belching and relieved with changing activities/position. There is no radiation or associated symptoms such as chest pain, SOB, diaphoresis, no jaw or arm pain. She noticed it about 2-3 months ago. She has tried any reflux/antiacid medications yet. She has not noticed it with other activities such as walking. She does not regularly exercise or have stairs to climb, but she just returned from the beach and was able to walk on the beach without symptoms. Recently she was able to walk > 10 minutes without CV symptoms. She has no known personal history of arrhythmia, CHF, MI, CAD. She has never smoked and is not a diabetic. Her mother had a stent around age 44. On exam, heart RRR, no murmur. Lungs clear. No carotid bruits. No LE edema.     EKG 10/14/16: NSR, possible LAE.  Echo 06/13/06:  SUMMARY - Overall left ventricular systolic function was normal. Left    ventricular ejection fraction was estimated , range being 55    % to 65 %. Although no diagnostic left ventricular regional    wall motion abnormality was identified, this possibility    cannot be completely excluded on the basis of this study.    Left ventricular diastolic function parameters were normal. COMPARISONS - Compared to the previous report of 01-Mar-2006 : - There has been no significant interval change.  Preoperative labs noted.  Patient with has intermittent epigastric pain with activities involving bending over, but not with activities while standing upright that improves with changing positions and some with belching. There has been no chest pain. She has been taking Aleve for her back (since first of June), but I now tapering off since pain is better. It is possibly this could be contributing, could also have a degree of reflux or possible hiatal hernia. She will plan to discuss with her PCP at a later date--sooner evaluation if worsening or different symptoms. Her EKG showed no ST/T wave abnormalities. I discussed above with anesthesiologist Dr. Ermalene Postin who agrees that if no change in her symptoms then it is anticipated that she can proceed as planned.   George Hugh [facility] Short Stay Center/Anesthesiology Phone [phone] 10/14/2016 2:23 PM

## 2016-10-16 MED ORDER — CELECOXIB 200 MG PO CAPS: 400.0000 mg | ORAL_CAPSULE | ORAL | Status: DC

## 2016-10-16 MED ORDER — GABAPENTIN 300 MG PO CAPS: 300.0000 mg | ORAL_CAPSULE | ORAL | Status: DC

## 2016-10-16 NOTE — H&P (Signed)
Lisa Salinas Location: [facility] Patient #: [id number] DOB: 07/09/1955 Married / Language: English / Race: White Female      History of Present Illness  This is a 61 year old female who is here with her daughter to discuss and plan definitive surgery for her recurrent right breast cancer. Amy Diona Browner is her PCP. Dr. Jana Hakim is her oncologist. She has seen Dr. Iran Planas. Her daughter is with her throughout the encounter.      In 2006 she presented with invasive cancer right breast. Central and superior. Underwent lumpectomy and sentinel node biopsy by Dr. Jennette Bill for a grade 1 invasive cancer. Nodes negative. She had adjuvant chemotherapy and adjuvant radiation therapy and the left-sided port was removed. She took anti-estrogens postop Recent mammogram showed pleomorphic and punctate calcifications located in the upper outer quadrant of the right breast adjacent to the lumpectomy site spanning 9 mm. That was biopsied. There was a second area of coarse calcifications slightly more posterior and lateral. That was not biopsied. 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 Left breast imaging was unremarkable.      She has seen Dr. Iran Planas and is not interested in reconstruction at this time.      Past history is fairly negative other than the breast cancer. She is healthy. Family history is positive for breast cancer in 2 maternal aunts, 1 paternal aunt, and one cousin. Social history reveals that she works in Programmer, applications for [employer]. She says is stressful. Denies alcohol or tobacco. Has 1 son and 1 daughter.      I had a long conversation with her and her daughter. We went over all of these issues. If her genetic testing is negative she wants unilateral mastectomy If her genetic testing is positive she strongly feels that she wants contralateral prophylactic  mastectomy She does not want reconstruction She wants to go ahead and tentatively schedule her surgery which is reasonable We will schedule her for right total mastectomy and right axillary sentinel node biopsy sometime over or after June 26. We should have the genetic testing by that time. If genetic testing is positive then we will add the prophylactic left mastectomy to the schedule. I discussed the indications, details, techniques, and numerous risk of the surgery with her. She is aware the risks of bleeding, infection, shoulder disability, arm swelling, arm numbness, and other unforeseen problems. She knows there is a small possibility of invasive cancer being found which might alter her treatment plan. She understands all these issues and all of her questions were answered. She agrees with this plan.   Addendum Note Genetic testing is negative for deleterious mutation   Allergies  SulfADIAZINE Sodium *SULFONAMIDES*  Allergies Reconciled   Medication History  TraZODone HCl (50MG  Tablet, Oral) Active. Tamoxifen Citrate (20MG  Tablet, Oral) Active. Sertraline HCl (50MG  Tablet, Oral) Active. Calcium (Oral) Specific strength unknown - Active. Medications Reconciled  Vitals Weight: 162.4 lb Height: 64in Body Surface Area: 1.79 m Body Mass Index: 27.88 kg/m  Temp.: 96.13F  Pulse: 95 (Regular)  BP: 130/80 (Sitting, Left Arm, Standard)    Physical Exam General Mental Status-Alert. General Appearance-Not in acute distress. Build & Nutrition-Well nourished. Posture-Normal posture. Gait-Normal.  Head and Neck Head-normocephalic, atraumatic with no lesions or palpable masses. Trachea-midline. Thyroid Gland Characteristics - normal size and consistency and no palpable nodules.  Chest and Lung Exam Chest and lung exam reveals -on auscultation, normal breath  sounds, no adventitious sounds and normal vocal resonance.  Breast Note:  Breast are medium size. At least 3 scars transversely in the left breast medially. Transverse scar above the right areola. Transverse scar inferior right breast. Some distortion right areola. No gross palpable mass on either side. Right axillary incision well healed and no adenopathy. Some distortion of the right areola. No gross palpable mass in either side. Right axillary incision well-healed. No axillary adenopathy.   Cardiovascular Cardiovascular examination reveals -normal heart sounds, regular rate and rhythm with no murmurs and femoral artery auscultation bilaterally reveals normal pulses, no bruits, no thrills.  Abdomen Inspection Inspection of the abdomen reveals - No Hernias. Palpation/Percussion Palpation and Percussion of the abdomen reveal - Soft, Non Tender, No Rigidity (guarding), No hepatosplenomegaly and No Palpable abdominal masses.  Neurologic Neurologic evaluation reveals -alert and oriented x 3 with no impairment of recent or remote memory, normal attention span and ability to concentrate, normal sensation and normal coordination.  Musculoskeletal Normal Exam - Bilateral-Upper Extremity Strength Normal and Lower Extremity Strength Normal.    Assessment & Plan  RECURRENT BREAST CANCER, RIGHT (C50.911)    You have seen Dr. Jana Hakim and discussed management of your recurrent right breast cancer He has started you on tamoxifen for now You have seen genetic counseling and genetic testing blood draw was performed on June 4. Genetic testing is negative and you state that you do not wish prophylactic mastectomy on the left. You have seen Dr. Iran Planas from plastic surgery and you stated that you would not like reconstruction at this time  It was your request to go ahead and begin scheduling process  you will be scheduled for right breast total mastectomy and right axillary sentinel node biopsy after June 26 when the genetic testing will be back We have discussed  the indications, techniques, and risk of this surgery in detail  HISTORY OF RIGHT BREAST CANCER (Z85.3)    Edsel Petrin. Dalbert Batman, M.D., [facility] General and Minimally invasive Surgery Breast and Colorectal Surgery Office:   [phone] Pager:   [phone]

## 2016-10-17 ENCOUNTER — Ambulatory Visit (HOSPITAL_COMMUNITY): Payer: BC Managed Care – PPO | Admitting: Vascular Surgery

## 2016-10-17 ENCOUNTER — Ambulatory Visit (HOSPITAL_COMMUNITY)
Admission: RE | Admit: 2016-10-17 | Discharge: 2016-10-17 | Disposition: A | Discharge status: Home / Self Care | Payer: BC Managed Care – PPO | Source: Ambulatory Visit | Attending: General Surgery | Admitting: General Surgery

## 2016-10-17 ENCOUNTER — Telehealth: Payer: Self-pay | Admitting: Oncology

## 2016-10-17 ENCOUNTER — Encounter (HOSPITAL_COMMUNITY): Payer: Self-pay | Admitting: Certified Registered Nurse Anesthetist

## 2016-10-17 ENCOUNTER — Encounter (HOSPITAL_COMMUNITY)
Admission: RE | Disposition: A | Discharge status: Home / Self Care | Payer: Self-pay | Source: Ambulatory Visit | Attending: General Surgery

## 2016-10-17 ENCOUNTER — Ambulatory Visit (HOSPITAL_COMMUNITY): Payer: BC Managed Care – PPO | Admitting: Certified Registered Nurse Anesthetist

## 2016-10-17 ENCOUNTER — Ambulatory Visit (HOSPITAL_COMMUNITY)
Admission: RE | Admit: 2016-10-17 | Discharge: 2016-10-18 | Disposition: A | Discharge status: Home / Self Care | Payer: BC Managed Care – PPO | Source: Ambulatory Visit | Attending: General Surgery | Admitting: General Surgery

## 2016-10-17 DIAGNOSIS — Z170 Estrogen receptor positive status [ER+]: Secondary | ICD-10-CM

## 2016-10-17 DIAGNOSIS — D0511 Intraductal carcinoma in situ of right breast: Secondary | ICD-10-CM | POA: Diagnosis not present

## 2016-10-17 DIAGNOSIS — Z853 Personal history of malignant neoplasm of breast: Secondary | ICD-10-CM | POA: Diagnosis present

## 2016-10-17 DIAGNOSIS — C50811 Malignant neoplasm of overlapping sites of right female breast: Secondary | ICD-10-CM

## 2016-10-17 DIAGNOSIS — Z803 Family history of malignant neoplasm of breast: Secondary | ICD-10-CM | POA: Insufficient documentation

## 2016-10-17 DIAGNOSIS — Z79899 Other long term (current) drug therapy: Secondary | ICD-10-CM | POA: Insufficient documentation

## 2016-10-17 DIAGNOSIS — F329 Major depressive disorder, single episode, unspecified: Secondary | ICD-10-CM | POA: Insufficient documentation

## 2016-10-17 DIAGNOSIS — C50911 Malignant neoplasm of unspecified site of right female breast: Secondary | ICD-10-CM | POA: Diagnosis present

## 2016-10-17 DIAGNOSIS — F419 Anxiety disorder, unspecified: Secondary | ICD-10-CM | POA: Insufficient documentation

## 2016-10-17 HISTORY — PX: MASTECTOMY W/ SENTINEL NODE BIOPSY: SHX2001

## 2016-10-17 HISTORY — DX: Basal cell carcinoma of skin of unspecified parts of face: C44.310

## 2016-10-17 HISTORY — PX: MASTECTOMY COMPLETE / SIMPLE: SUR845

## 2016-10-17 HISTORY — DX: Malignant neoplasm of unspecified site of right female breast: C50.911

## 2016-10-17 LAB — CREATININE, SERUM
CREATININE: 0.79 mg/dL (ref 0.44–1.00)
GFR calc Af Amer: >60 mL/min (ref >60)
GFR calc non Af Amer: >60 mL/min (ref >60)

## 2016-10-17 LAB — CBC
HCT: 41.2 % (ref 36.0–46.0)
Hemoglobin: 13.4 g/dL (ref 12.0–15.0)
MCH: 29.6 pg (ref 26.0–34.0)
MCHC: 32.5 g/dL (ref 30.0–36.0)
MCV: 91.2 fL (ref 78.0–100.0)
PLATELETS: 152 10*3/uL (ref 150–400)
RBC: 4.52 MIL/uL (ref 3.87–5.11)
RDW: 12.9 % (ref 11.5–15.5)
WBC: 8.9 10*3/uL (ref 4.0–10.5)

## 2016-10-17 SURGERY — MASTECTOMY WITH SENTINEL LYMPH NODE BIOPSY
Anesthesia: General | Site: Breast | Laterality: Right

## 2016-10-17 MED ORDER — MEPERIDINE HCL 25 MG/ML IJ SOLN: 6.2500 mg | INTRAMUSCULAR | Status: DC | PRN

## 2016-10-17 MED ORDER — METHYLENE BLUE 0.5 % INJ SOLN
INTRAVENOUS | Status: DC | PRN
  Administered 2016-10-17: 5 mL

## 2016-10-17 MED ORDER — SCOPOLAMINE 1 MG/3DAYS TD PT72
MEDICATED_PATCH | TRANSDERMAL | Status: DC | PRN
  Administered 2016-10-17: 1 via TRANSDERMAL

## 2016-10-17 MED ORDER — POTASSIUM CHLORIDE 2 MEQ/ML IV SOLN
INTRAVENOUS | Status: DC
  Administered 2016-10-17: 21:00:00 via INTRAVENOUS
  Filled 2016-10-17 (×3): qty 1000

## 2016-10-17 MED ORDER — SENNA 8.6 MG PO TABS
1.0000 | ORAL_TABLET | Freq: Two times a day (BID) | ORAL | Status: DC
  Administered 2016-10-17: 8.6 mg via ORAL
  Filled 2016-10-17: qty 1

## 2016-10-17 MED ORDER — OXYCODONE HCL 5 MG PO TABS
5.0000 mg | ORAL_TABLET | Freq: Once | ORAL | Status: AC | PRN
  Administered 2016-10-17: 5 mg via ORAL

## 2016-10-17 MED ORDER — LIDOCAINE HCL (CARDIAC) 20 MG/ML IV SOLN
INTRAVENOUS | Status: DC | PRN
  Administered 2016-10-17: 80 mg via INTRAVENOUS

## 2016-10-17 MED ORDER — 0.9 % SODIUM CHLORIDE (POUR BTL) OPTIME
TOPICAL | Status: DC | PRN
Start: 2016-10-17 — End: 2016-10-17
  Administered 2016-10-17 (×2): 1000 mL

## 2016-10-17 MED ORDER — OXYCODONE HCL 5 MG/5ML PO SOLN: 5.0000 mg | Freq: Once | ORAL | Status: AC | PRN

## 2016-10-17 MED ORDER — FENTANYL CITRATE (PF) 250 MCG/5ML IJ SOLN
INTRAMUSCULAR | Status: AC
  Filled 2016-10-17: qty 5

## 2016-10-17 MED ORDER — METHYLENE BLUE 0.5 % INJ SOLN
INTRAVENOUS | Status: AC
  Filled 2016-10-17: qty 10

## 2016-10-17 MED ORDER — HYDROMORPHONE HCL 1 MG/ML IJ SOLN
0.2500 mg | INTRAMUSCULAR | Status: DC | PRN
  Administered 2016-10-17: 0.5 mg via INTRAVENOUS

## 2016-10-17 MED ORDER — CALCIUM CITRATE-VITAMIN D 500-500 MG-UNIT PO CHEW
1.0000 | CHEWABLE_TABLET | Freq: Two times a day (BID) | ORAL | Status: DC
  Filled 2016-10-17: qty 1

## 2016-10-17 MED ORDER — SERTRALINE HCL 50 MG PO TABS: 50.0000 mg | ORAL_TABLET | Freq: Every day | ORAL | Status: DC

## 2016-10-17 MED ORDER — PROMETHAZINE HCL 25 MG/ML IJ SOLN: 6.2500 mg | INTRAMUSCULAR | Status: DC | PRN

## 2016-10-17 MED ORDER — ONDANSETRON HCL 4 MG/2ML IJ SOLN
INTRAMUSCULAR | Status: AC
  Filled 2016-10-17: qty 2

## 2016-10-17 MED ORDER — CHOLECALCIFEROL 10 MCG (400 UNIT) PO TABS
400.0000 [IU] | ORAL_TABLET | Freq: Two times a day (BID) | ORAL | Status: DC
  Administered 2016-10-17: 400 [IU] via ORAL
  Filled 2016-10-17 (×2): qty 1

## 2016-10-17 MED ORDER — TAMOXIFEN CITRATE 10 MG PO TABS
20.0000 mg | ORAL_TABLET | Freq: Every day | ORAL | Status: DC
  Filled 2016-10-17: qty 2

## 2016-10-17 MED ORDER — EPHEDRINE 5 MG/ML INJ
INTRAVENOUS | Status: AC
  Filled 2016-10-17: qty 10

## 2016-10-17 MED ORDER — CEFAZOLIN SODIUM-DEXTROSE 2-4 GM/100ML-% IV SOLN
2.0000 g | INTRAVENOUS | Status: AC
  Administered 2016-10-17: 2 g via INTRAVENOUS
  Filled 2016-10-17: qty 100

## 2016-10-17 MED ORDER — HYDROMORPHONE HCL 1 MG/ML IJ SOLN: 1.0000 mg | INTRAMUSCULAR | Status: DC | PRN

## 2016-10-17 MED ORDER — LACTATED RINGERS IV SOLN
INTRAVENOUS | Status: DC
  Administered 2016-10-17: 13:00:00 via INTRAVENOUS
  Administered 2016-10-17: 50 mL/h via INTRAVENOUS

## 2016-10-17 MED ORDER — OXYCODONE HCL 5 MG PO TABS
ORAL_TABLET | ORAL | Status: AC
  Administered 2016-10-17: 5 mg via ORAL
  Filled 2016-10-17: qty 1

## 2016-10-17 MED ORDER — EPHEDRINE SULFATE 50 MG/ML IJ SOLN
INTRAMUSCULAR | Status: DC | PRN
  Administered 2016-10-17: 5 mg via INTRAVENOUS

## 2016-10-17 MED ORDER — HYDROMORPHONE HCL 1 MG/ML IJ SOLN
INTRAMUSCULAR | Status: AC
  Filled 2016-10-17: qty 0.5

## 2016-10-17 MED ORDER — CHLORHEXIDINE GLUCONATE CLOTH 2 % EX PADS: 6.0000 | MEDICATED_PAD | Freq: Once | CUTANEOUS | Status: DC

## 2016-10-17 MED ORDER — ENOXAPARIN SODIUM 40 MG/0.4ML ~~LOC~~ SOLN
40.0000 mg | SUBCUTANEOUS | Status: DC
  Administered 2016-10-18: 40 mg via SUBCUTANEOUS
  Filled 2016-10-17: qty 0.4

## 2016-10-17 MED ORDER — HYDROMORPHONE HCL 1 MG/ML IJ SOLN
INTRAMUSCULAR | Status: AC
  Administered 2016-10-17: 0.5 mg via INTRAVENOUS
  Filled 2016-10-17: qty 0.5

## 2016-10-17 MED ORDER — GABAPENTIN 300 MG PO CAPS
ORAL_CAPSULE | ORAL | Status: AC
  Administered 2016-10-17: 300 mg
  Filled 2016-10-17: qty 1

## 2016-10-17 MED ORDER — HYDROCODONE-ACETAMINOPHEN 5-325 MG PO TABS
1.0000 | ORAL_TABLET | ORAL | Status: DC | PRN
  Administered 2016-10-17 – 2016-10-18 (×2): 1 via ORAL
  Filled 2016-10-17 (×2): qty 1

## 2016-10-17 MED ORDER — SODIUM CHLORIDE 0.9 % IJ SOLN
INTRAMUSCULAR | Status: AC
  Filled 2016-10-17: qty 10

## 2016-10-17 MED ORDER — MIDAZOLAM HCL 2 MG/2ML IJ SOLN
INTRAMUSCULAR | Status: AC
  Filled 2016-10-17: qty 2

## 2016-10-17 MED ORDER — BUPIVACAINE-EPINEPHRINE (PF) 0.25% -1:200000 IJ SOLN
INTRAMUSCULAR | Status: AC
  Filled 2016-10-17: qty 30

## 2016-10-17 MED ORDER — ACETAMINOPHEN 650 MG RE SUPP: 650.0000 mg | Freq: Four times a day (QID) | RECTAL | Status: DC | PRN

## 2016-10-17 MED ORDER — ONDANSETRON HCL 4 MG/2ML IJ SOLN
INTRAMUSCULAR | Status: DC | PRN
  Administered 2016-10-17: 4 mg via INTRAVENOUS

## 2016-10-17 MED ORDER — FENTANYL CITRATE (PF) 100 MCG/2ML IJ SOLN
INTRAMUSCULAR | Status: DC | PRN
Start: 2016-10-17 — End: 2016-10-17
  Administered 2016-10-17 (×2): 25 ug via INTRAVENOUS

## 2016-10-17 MED ORDER — CEFAZOLIN SODIUM-DEXTROSE 2-4 GM/100ML-% IV SOLN
2.0000 g | Freq: Three times a day (TID) | INTRAVENOUS | Status: AC
  Administered 2016-10-17: 2 g via INTRAVENOUS
  Filled 2016-10-17: qty 100

## 2016-10-17 MED ORDER — PROPOFOL 10 MG/ML IV BOLUS
INTRAVENOUS | Status: DC | PRN
  Administered 2016-10-17: 200 mg via INTRAVENOUS

## 2016-10-17 MED ORDER — LIDOCAINE 2% (20 MG/ML) 5 ML SYRINGE
INTRAMUSCULAR | Status: AC
  Filled 2016-10-17: qty 5

## 2016-10-17 MED ORDER — TRAZODONE HCL 50 MG PO TABS
25.0000 mg | ORAL_TABLET | Freq: Every evening | ORAL | Status: DC | PRN
Start: 2016-10-17 — End: 2016-10-18

## 2016-10-17 MED ORDER — DEXAMETHASONE SODIUM PHOSPHATE 4 MG/ML IJ SOLN
INTRAMUSCULAR | Status: DC | PRN
  Administered 2016-10-17: 10 mg via INTRAVENOUS

## 2016-10-17 MED ORDER — ACETAMINOPHEN 325 MG PO TABS: 650.0000 mg | ORAL_TABLET | Freq: Four times a day (QID) | ORAL | Status: DC | PRN

## 2016-10-17 MED ORDER — METHOCARBAMOL 500 MG PO TABS
500.0000 mg | ORAL_TABLET | Freq: Four times a day (QID) | ORAL | Status: DC | PRN
  Administered 2016-10-17: 500 mg via ORAL

## 2016-10-17 MED ORDER — CELECOXIB 200 MG PO CAPS
ORAL_CAPSULE | ORAL | Status: AC
  Administered 2016-10-17: 400 mg
  Filled 2016-10-17: qty 2

## 2016-10-17 MED ORDER — PROPOFOL 10 MG/ML IV BOLUS
INTRAVENOUS | Status: AC
  Filled 2016-10-17: qty 20

## 2016-10-17 MED ORDER — ACETAMINOPHEN 500 MG PO TABS
1000.0000 mg | ORAL_TABLET | ORAL | Status: AC
  Administered 2016-10-17: 1000 mg via ORAL
  Filled 2016-10-17: qty 2

## 2016-10-17 MED ORDER — TECHNETIUM TC 99M SULFUR COLLOID FILTERED
1.0000 | Freq: Once | INTRAVENOUS | Status: AC | PRN
  Administered 2016-10-17: 1 via INTRADERMAL

## 2016-10-17 MED ORDER — GLYCOPYRROLATE 0.2 MG/ML IJ SOLN
INTRAMUSCULAR | Status: DC | PRN
  Administered 2016-10-17: 0.2 mg via INTRAVENOUS

## 2016-10-17 MED ORDER — FENTANYL CITRATE (PF) 100 MCG/2ML IJ SOLN
INTRAMUSCULAR | Status: AC
  Administered 2016-10-17: 100 ug
  Filled 2016-10-17: qty 2

## 2016-10-17 MED ORDER — CALCIUM CITRATE 950 (200 CA) MG PO TABS
200.0000 mg | ORAL_TABLET | Freq: Two times a day (BID) | ORAL | Status: DC
  Administered 2016-10-17: 200 mg via ORAL
  Filled 2016-10-17 (×2): qty 1

## 2016-10-17 MED ORDER — ROPIVACAINE HCL 5 MG/ML IJ SOLN
INTRAMUSCULAR | Status: DC | PRN
  Administered 2016-10-17: 30 mL via PERINEURAL

## 2016-10-17 MED ORDER — DEXAMETHASONE SODIUM PHOSPHATE 10 MG/ML IJ SOLN
INTRAMUSCULAR | Status: AC
  Filled 2016-10-17: qty 1

## 2016-10-17 MED ORDER — MIDAZOLAM HCL 2 MG/2ML IJ SOLN
INTRAMUSCULAR | Status: AC
  Administered 2016-10-17: 2 mg
  Filled 2016-10-17: qty 2

## 2016-10-17 MED ORDER — METHOCARBAMOL 500 MG PO TABS
ORAL_TABLET | ORAL | Status: AC
  Administered 2016-10-17: 500 mg via ORAL
  Filled 2016-10-17: qty 1

## 2016-10-17 MED ORDER — SCOPOLAMINE 1 MG/3DAYS TD PT72
MEDICATED_PATCH | TRANSDERMAL | Status: AC
  Filled 2016-10-17: qty 1

## 2016-10-17 SURGICAL SUPPLY — 63 items
ADH SKN CLS APL DERMABOND .7 (GAUZE/BANDAGES/DRESSINGS) ×1
APPLIER CLIP 9.375 MED OPEN (MISCELLANEOUS) ×2
APR CLP MED 9.3 20 MLT OPN (MISCELLANEOUS) ×1
BINDER BREAST LRG (GAUZE/BANDAGES/DRESSINGS) IMPLANT
BINDER BREAST XLRG (GAUZE/BANDAGES/DRESSINGS) ×1 IMPLANT
CANISTER SUCT 3000ML PPV (MISCELLANEOUS) ×2 IMPLANT
CHLORAPREP W/TINT 26ML (MISCELLANEOUS) ×2 IMPLANT
CLIP APPLIE 9.375 MED OPEN (MISCELLANEOUS) ×1 IMPLANT
CONT SPEC 4OZ CLIKSEAL STRL BL (MISCELLANEOUS) ×2 IMPLANT
COVER PROBE W GEL 5X96 (DRAPES) ×2 IMPLANT
COVER SURGICAL LIGHT HANDLE (MISCELLANEOUS) ×2 IMPLANT
DERMABOND ADVANCED (GAUZE/BANDAGES/DRESSINGS) ×1
DERMABOND ADVANCED .7 DNX12 (GAUZE/BANDAGES/DRESSINGS) ×1 IMPLANT
DEVICE DISSECT PLASMABLAD 3.0S (MISCELLANEOUS) IMPLANT
DRAIN CHANNEL 19F RND (DRAIN) ×3 IMPLANT
DRAPE CHEST BREAST 15X10 FENES (DRAPES) IMPLANT
DRAPE HALF SHEET 40X57 (DRAPES) ×2 IMPLANT
DRAPE ORTHO SPLIT 87X125 STRL (DRAPES) IMPLANT
ELECT BLADE 4.0 EZ CLEAN MEGAD (MISCELLANEOUS) ×2
ELECT CAUTERY BLADE 6.4 (BLADE) ×2 IMPLANT
ELECT REM PT RETURN 9FT ADLT (ELECTROSURGICAL) ×2
ELECTRODE BLDE 4.0 EZ CLN MEGD (MISCELLANEOUS) ×1 IMPLANT
ELECTRODE REM PT RTRN 9FT ADLT (ELECTROSURGICAL) ×1 IMPLANT
EVACUATOR SILICONE 100CC (DRAIN) ×3 IMPLANT
GAUZE SPONGE 4X4 12PLY STRL LF (GAUZE/BANDAGES/DRESSINGS) ×1 IMPLANT
GLOVE BIO SURGEON STRL SZ7.5 (GLOVE) ×1 IMPLANT
GLOVE EUDERMIC 7 POWDERFREE (GLOVE) ×2 IMPLANT
GLOVE INDICATOR 6.5 STRL GRN (GLOVE) ×1 IMPLANT
GLOVE INDICATOR 7.5 STRL GRN (GLOVE) ×1 IMPLANT
GLOVE SURG SS PI 6.5 STRL IVOR (GLOVE) ×1 IMPLANT
GOWN STRL REUS W/ TWL LRG LVL3 (GOWN DISPOSABLE) ×1 IMPLANT
GOWN STRL REUS W/ TWL XL LVL3 (GOWN DISPOSABLE) ×1 IMPLANT
GOWN STRL REUS W/TWL LRG LVL3 (GOWN DISPOSABLE) ×4
GOWN STRL REUS W/TWL XL LVL3 (GOWN DISPOSABLE) ×2
ILLUMINATOR WAVEGUIDE N/F (MISCELLANEOUS) IMPLANT
KIT BASIN OR (CUSTOM PROCEDURE TRAY) ×2 IMPLANT
KIT ROOM TURNOVER OR (KITS) ×2 IMPLANT
LIGHT WAVEGUIDE WIDE FLAT (MISCELLANEOUS) IMPLANT
NDL 18GX1X1/2 (RX/OR ONLY) (NEEDLE) ×1 IMPLANT
NDL FILTER BLUNT 18X1 1/2 (NEEDLE) ×1 IMPLANT
NDL HYPO 25GX1X1/2 BEV (NEEDLE) ×1 IMPLANT
NEEDLE 18GX1X1/2 (RX/OR ONLY) (NEEDLE) ×2 IMPLANT
NEEDLE FILTER BLUNT 18X 1/2SAF (NEEDLE) ×1
NEEDLE FILTER BLUNT 18X1 1/2 (NEEDLE) ×1 IMPLANT
NEEDLE HYPO 25GX1X1/2 BEV (NEEDLE) ×2 IMPLANT
NS IRRIG 1000ML POUR BTL (IV SOLUTION) ×2 IMPLANT
PACK GENERAL/GYN (CUSTOM PROCEDURE TRAY) ×2 IMPLANT
PAD ABD 8X10 STRL (GAUZE/BANDAGES/DRESSINGS) ×1 IMPLANT
PAD ARMBOARD 7.5X6 YLW CONV (MISCELLANEOUS) ×2 IMPLANT
PLASMABLADE 3.0S (MISCELLANEOUS)
SPECIMEN JAR X LARGE (MISCELLANEOUS) ×2 IMPLANT
SPONGE LAP 18X18 X RAY DECT (DISPOSABLE) ×1 IMPLANT
SUT ETHILON 3 0 FSL (SUTURE) ×3 IMPLANT
SUT ETHILON 3 0 PS 1 (SUTURE) ×3 IMPLANT
SUT MNCRL AB 4-0 PS2 18 (SUTURE) ×2 IMPLANT
SUT SILK 2 0 FS (SUTURE) ×3 IMPLANT
SUT VIC AB 3-0 54X BRD REEL (SUTURE) IMPLANT
SUT VIC AB 3-0 BRD 54 (SUTURE) ×2
SUT VIC AB 3-0 SH 18 (SUTURE) ×3 IMPLANT
SYR CONTROL 10ML LL (SYRINGE) ×3 IMPLANT
TOWEL OR 17X24 6PK STRL BLUE (TOWEL DISPOSABLE) ×2 IMPLANT
TOWEL OR 17X26 10 PK STRL BLUE (TOWEL DISPOSABLE) ×2 IMPLANT
TUBE CONNECTING 12X1/4 (SUCTIONS) IMPLANT

## 2016-10-17 NOTE — Op Note (Signed)
Patient Name:           Lisa Salinas   Date of Surgery:        10/17/2016  Pre op Diagnosis:      Recurrent cancer, right breast  Post op Diagnosis:    Recurrent cancer, right breast  Procedure:                 Inject blue dye right breast                                      Right axillary deep sentinel lymph node biopsy                                      Right total mastectomy  Surgeon:                     Edsel Petrin. Dalbert Batman, M.D., FACS  Assistant:                     Sharyn Dross, RNFA   Indication for Assistant:   N/A  Operative Indications:   This is a 61 year old female who is brought to the operating room electively for definitive surgery for her recurrent right breast cancer.Amy Diona Browner is her PCP. Dr. Jana Hakim is her oncologist.       In 2006 she presented with invasive cancer right breast. Central and superior. Underwent lumpectomy and sentinel node biopsy by Dr. Margot Chimes for a grade 1 invasive cancer. Nodes negative. She had adjuvant chemotherapy and adjuvant radiation therapy and the left-sided port was removed. She took anti-estrogens postop   Recent mammogram showed pleomorphic and punctate calcifications located in the upper outer quadrant of the right breast adjacent to the lumpectomy site spanning 9 mm. That was biopsied. There was a second area of coarse calcifications slightly more posterior and lateral. That was not biopsied. 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 Left breast imaging was unremarkable.      She has seen Dr. Iran Planas and is not interested in reconstruction at this time.      Past history is fairly negative other than the breast cancer. She is healthy. Family history is positive for breast cancer in 2 maternal aunts, 1 paternal aunt, and one cousin..      I had a long conversation with her and her daughter. We went over all of these issues.  Her genetic  testing was repeated and is negative for deleterious mutations.  She desires mastectomy which is the standard of care here.  She is not interested in prophylactic contralateral mastectomy since her genetics is negative. We will schedule her for right total mastectomy and right axillary deep sentinel node biopsy.   I discussed the indications, details, techniques, and numerous risk of the surgery with her. . She agrees with this plan.  Operative Findings:       The mastectomy was uneventful without any obvious gross findings of cancer.  Due to her previous radiation therapy and chemotherapy the skin and all the tissues were somewhat firm and not very elastic.  This required some undermining for closure of the incision.  There were no grossly enlarged lymph nodes.  There was only slight radioactivity in the axilla  and so I simply removed the lymph nodes in the axilla along with the tail of Spence where there was a little bit of radioactivity.  Procedure in Detail:          The patient underwent right pectoral block by the anesthesiologist.  The patient underwent injection of technetium 99 radionuclide by the nuclear medicine technician.  The patient was taken the operating room and underwent general LMA anesthesia.  Timeout was obtained.  Intravenous antibiotics were given.  Following alcohol prep I injected 5 mL of dilute methylene blue into the right breast, subareolar and lateral tissues.  The breast was massaged for a few minutes.     The neck and right chest wall and axilla were then prepped and draped in a sterile fashion.  Using a marking pen I planned a transverse elliptical incision, removing the nipple and areola and some of the old scars.  Incision was made.  Skin flaps were raised medially to the parasternal area, inferiorly to the anterior rectus sheath, superiorly to the clavicular area and laterally to the lateral border of the latissimus dorsi muscle.  The breast was dissected from superior to  inferior, off of the pectoralis major and minor muscles.  Treatment effect from previous cancer was noted.  The tissues were firm and not very elastic.  The lateral skin margin was marked with a short double silk suture.  Dissection was carried up into the axilla.  I used the neoprobe several times and tried to find any significant radioactivity but there was nothing much more than background.  I simply remove the tail of Spence and the level one lymph nodes.  There was no palpable abnormality in the axilla.  The axillary tail was marked with a long silk suture and sent to the lab with the main specimen.  Hemostasis was excellent and achieved electrocautery.  The wound was irrigated.  I undermined the superior and inferior flaps a little bit.  Two 47 French Blake drains were placed, one up into the axilla and one across the skin flaps.  These drains were brought out inferolaterally through separate stab incisions and sutured the skin with nylon sutures and connected to suction bulbs.  I closed the mastectomy incision primarily.  I placed 4 or 5 interrupted mattress sutures of 3-0 nylon to bring the skin together.  I closed the subcutaneous tissue with interrupted sutures of 3-0 Vicryl.  The skin was closed with a running subcuticular 4-0 Monocryl and Dermabond.  Clean bandages and a breast binder were placed.  The patient tolerated the procedure well was taken to PACU in stable condition.  EBL less than 75 mL.  Counts correct.  Complications none.     Edsel Petrin. Dalbert Batman, M.D., FACS General and Minimally Invasive Surgery Breast and Colorectal Surgery  10/17/2016 12:29 PM

## 2016-10-17 NOTE — Anesthesia Preprocedure Evaluation (Signed)
Anesthesia Evaluation  Patient identified by MRN, date of birth, ID band Patient awake    Reviewed: Allergy & Precautions, NPO status , Patient's Chart, lab work & pertinent test results  Airway Mallampati: II  TM Distance: >3 FB Neck ROM: Full    Dental no notable dental hx.    Pulmonary neg pulmonary ROS,    Pulmonary exam normal breath sounds clear to auscultation       Cardiovascular negative cardio ROS Normal cardiovascular exam Rhythm:Regular Rate:Normal     Neuro/Psych Anxiety Depression negative neurological ROS  negative psych ROS   GI/Hepatic negative GI ROS, Neg liver ROS,   Endo/Other  negative endocrine ROS  Renal/GU negative Renal ROS  negative genitourinary   Musculoskeletal negative musculoskeletal ROS (+)   Abdominal   Peds negative pediatric ROS (+)  Hematology negative hematology ROS (+)   Anesthesia Other Findings Recurrent breast cancer  Reproductive/Obstetrics negative OB ROS                             Anesthesia Physical Anesthesia Plan  ASA: II  Anesthesia Plan: General   Post-op Pain Management: GA combined w/ Regional for post-op pain   Induction: Intravenous  PONV Risk Score and Plan: 3 and Ondansetron, Dexamethasone, Propofol and Midazolam  Airway Management Planned:   Additional Equipment:   Intra-op Plan:   Post-operative Plan: Extubation in OR  Informed Consent: I have reviewed the patients History and Physical, chart, labs and discussed the procedure including the risks, benefits and alternatives for the proposed anesthesia with the patient or authorized representative who has indicated his/her understanding and acceptance.   Dental advisory given  Plan Discussed with: CRNA  Anesthesia Plan Comments:         Anesthesia Quick Evaluation

## 2016-10-17 NOTE — Transfer of Care (Signed)
Immediate Anesthesia Transfer of Care Note  Patient: Lisa Salinas  Procedure(s) Performed: Procedure(s): RIGHT MASTECTOMY WITH SENTINEL LYMPH NODE BIOPSY (Right)  Patient Location: PACU  Anesthesia Type:General  Level of Consciousness: awake, alert  and oriented  Airway & Oxygen Therapy: Patient Spontanous Breathing and Patient connected to nasal cannula oxygen  Post-op Assessment: Report given to RN and Post -op Vital signs reviewed and stable  Post vital signs: Reviewed and stable  Last Vitals:  Vitals:   10/17/16 1019 10/17/16 1025  BP: 104/61 106/64  Pulse:    Resp:    Temp:      Last Pain:  Vitals:   10/17/16 0850  TempSrc: Oral      Patients Stated Pain Goal: 8 (77/93/90 3009)  Complications: No apparent anesthesia complications

## 2016-10-17 NOTE — Anesthesia Postprocedure Evaluation (Signed)
Anesthesia Post Note  Patient: Lisa Salinas  Procedure(s) Performed: Procedure(s) (LRB): RIGHT MASTECTOMY WITH SENTINEL LYMPH NODE BIOPSY (Right)     Patient location during evaluation: PACU Anesthesia Type: General Level of consciousness: awake and alert Pain management: pain level controlled Vital Signs Assessment: post-procedure vital signs reviewed and stable Respiratory status: spontaneous breathing, nonlabored ventilation and respiratory function stable Cardiovascular status: blood pressure returned to baseline and stable Postop Assessment: no signs of nausea or vomiting Anesthetic complications: no    Last Vitals:  Vitals:   10/17/16 1410 10/17/16 1425  BP: 112/69 111/67  Pulse: 70 70  Resp: 20 18  Temp:      Last Pain:  Vitals:   10/17/16 1425  TempSrc:   PainSc: Asleep                 Lynda Rainwater

## 2016-10-17 NOTE — Progress Notes (Signed)
Received pt from PACU, A&O x4. Right chest incision clean and dry. JP drains in place and charged. Denies pain at this time. Call light w/in reach.

## 2016-10-17 NOTE — Telephone Encounter (Signed)
Patient left a voicemail message to have her FMLA/Disability forms corrected to show her first day of leave as 09/26/16, not 10/10/16 which I did correct and faxed to [facility] fax # [phone]. Left patient a voicemail message that these actions have been taken

## 2016-10-17 NOTE — Anesthesia Procedure Notes (Signed)
Anesthesia Regional Block: Pectoralis block   Pre-Anesthetic Checklist: ,, timeout performed, Correct Patient, Correct Site, Correct Laterality, Correct Procedure, Correct Position, site marked, Risks and benefits discussed,  Surgical consent,  Pre-op evaluation,  At surgeon's request and post-op pain management  Laterality: Right  Prep: Dura Prep       Needles:  Injection technique: Single-shot  Needle Type: Stimiplex     Needle Length: 9cm  Needle Gauge: 21     Additional Needles:   Procedures: ultrasound guided,,,,,,,,  Narrative:  Start time: 10/17/2016 10:03 AM End time: 10/17/2016 10:08 AM Injection made incrementally with aspirations every 5 mL.  Performed by: Personally  Anesthesiologist: Candida Peeling RAY

## 2016-10-17 NOTE — Interval H&P Note (Signed)
History and Physical Interval Note:  10/17/2016 10:20 AM  Lisa Salinas  has presented today for surgery, with the diagnosis of RECURRENT RIGHT BREAST CANCER  The various methods of treatment have been discussed with the patient and family. After consideration of risks, benefits and other options for treatment, the patient has consented to  Procedure(s): RIGHT MASTECTOMY WITH SENTINEL LYMPH NODE BIOPSY (Right) as a surgical intervention .  The patient's history has been reviewed, patient examined, no change in status, stable for surgery.  I have reviewed the patient's chart and labs.  Questions were answered to the patient's satisfaction.     Adin Hector

## 2016-10-17 NOTE — Anesthesia Procedure Notes (Signed)
Procedure Name: LMA Insertion Date/Time: 10/17/2016 11:00 AM Performed by: Deliah Boston Pre-anesthesia Checklist: Patient identified, Emergency Drugs available, Suction available and Patient being monitored Patient Re-evaluated:Patient Re-evaluated prior to inductionOxygen Delivery Method: Circle system utilized Preoxygenation: Pre-oxygenation with 100% oxygen Intubation Type: IV induction Ventilation: Mask ventilation without difficulty LMA: LMA inserted LMA Size: 4.0 Number of attempts: 1 Placement Confirmation: positive ETCO2 and breath sounds checked- equal and bilateral Tube secured with: Tape Dental Injury: Teeth and Oropharynx as per pre-operative assessment

## 2016-10-18 ENCOUNTER — Encounter (HOSPITAL_COMMUNITY): Payer: Self-pay | Admitting: General Surgery

## 2016-10-18 DIAGNOSIS — D0511 Intraductal carcinoma in situ of right breast: Secondary | ICD-10-CM | POA: Diagnosis not present

## 2016-10-18 MED ORDER — HYDROCODONE-ACETAMINOPHEN 5-325 MG PO TABS: 1.0000 | ORAL_TABLET | ORAL | 0 refills | Status: DC | PRN

## 2016-10-18 MED ORDER — ALUM & MAG HYDROXIDE-SIMETH 200-200-20 MG/5ML PO SUSP
15.0000 mL | Freq: Four times a day (QID) | ORAL | Status: DC | PRN
  Administered 2016-10-18: 15 mL via ORAL
  Filled 2016-10-18: qty 30

## 2016-10-18 NOTE — Discharge Summary (Signed)
Patient ID: Lisa Salinas [id number] 61 y.o. 1955-04-28  Admit date: 10/17/2016  Discharge date and time: 10/18/2016  Admitting Physician: Adin Hector  Discharge Physician: Adin Hector  Admission Diagnoses: RECURRENT RIGHT BREAST CANCER  Discharge Diagnoses: Recurrent right breast cancer  Operations: Procedure(s): RIGHT MASTECTOMY WITH SENTINEL LYMPH NODE BIOPSY  Admission Condition: good  Discharged Condition: good  Indication for Admission:   This is a 61 year old female who is brought to the operating room electively for definitive surgery for her recurrent right breast cancer.Amy Diona Browner is her PCP. Dr. Jana Hakim is her oncologist.  In 2006 she presented with invasive cancer right breast. Central and superior. Underwent lumpectomy and sentinel node biopsy by Dr. Margot Chimes for a grade 1 invasive cancer. Nodes negative. She had adjuvant chemotherapy and adjuvant radiation therapy and the left-sided port was removed. She took anti-estrogens postop   Recent mammogram showed pleomorphic and punctate calcifications located in the upper outer quadrant of the right breast adjacent to the lumpectomy site spanning 9 mm. That was biopsied. There was a second area of coarse calcifications slightly more posterior and lateral. That was not biopsied. 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 Left breast imaging was unremarkable. She has seen Dr. Iran Planas and is not interested in reconstruction at this time. Past history is fairly negative other than the breast cancer. She is healthy. Family history is positive for breast cancer in 2 maternal aunts, 1 paternal aunt, and one cousin.. I had a long conversation with her and her daughter. We went over all of these issues.  Her genetic testing was repeated and is negative for deleterious mutations.  She desires mastectomy which is  the standard of care here.  She is not interested in prophylactic contralateral mastectomy since her genetics is negative. We will schedule her for right total mastectomy and right axillary deep sentinel node biopsy.   I discussed the indications, details, techniques, and numerous risk of the surgery with her. . She agrees with this plan.  Hospital Course: On the day of admission the patient was taken to the operating room and underwent right total mastectomy and right axillary deep sentinel lymph node biopsy.  The surgery was uneventful.  Due to previous treatment effect the tissues were not very elastic we did get primary closure with healthy skin flaps.  Final pathology is pending she was observed overnight and did very well.  On postop day 1 she was alert and pleasant and was having no pain whatsoever.  She asked to go home.  Her right mastectomy skin flaps looked very good with good vascularity.  No hematoma or seroma.  She could move her right shoulder around fairly well.  Drainage was serosanguineous in both drains were functioning.     She was given instruction in diet and activities.  She was given a prescription for Norco for pain but asked to transition to Tylenol as tolerated.  Continue usual medications.  She is instructed in drain care and record keeping.  Return to see me in 2 weeks.  Consults: None  Significant Diagnostic Studies: Surgical pathology, pending  Treatments: surgery: Right total mastectomy with deep sentinel lymph node biopsy  Disposition: Home  Patient Instructions:  Allergies as of 10/18/2016      Reactions   Sulfonamide Derivatives Rash   REACTION: Whelps      Medication List    TAKE these medications   calcium citrate-vitamin D 315-200 MG-UNIT  tablet Commonly known as:  CITRACAL+D Take 1 tablet by mouth 2 (two) times daily.   HYDROcodone-acetaminophen 5-325 MG tablet Commonly known as:  NORCO/VICODIN Take 1-2 tablets by mouth every 4 (four) hours as  needed for moderate pain.   sertraline 50 MG tablet Commonly known as:  ZOLOFT Take 1 tablet (50 mg total) by mouth daily.   tamoxifen 20 MG tablet Commonly known as:  NOLVADEX Take 20 mg by mouth daily.   traZODone 50 MG tablet Commonly known as:  DESYREL Take 0.5-1 tablets (25-50 mg total) by mouth at bedtime as needed for sleep.       Activity: Ambulation and shoulder range of motion exercises encouraged.  No driving or lifting.  Discussed in detail Diet: low fat, low cholesterol diet Wound Care: as directed  Follow-up:  With Dr. Dalbert Batman in 2 weeks.  Signed: Edsel Petrin. Dalbert Batman, M.D., FACS General and minimally invasive surgery Breast and Colorectal Surgery   Addendum: I logged onto the [facility] website and reviewed her prescription medication history  10/18/2016, 7:54 AM

## 2016-10-18 NOTE — Discharge Instructions (Signed)
[facility] °[phone] ° °MASTECTOMY: POST OP INSTRUCTIONS ° °Always review your discharge instruction sheet given to you by the facility where your surgery was performed. °IF YOU HAVE DISABILITY OR FAMILY LEAVE FORMS, YOU MUST BRING THEM TO THE OFFICE FOR PROCESSING.   °DO NOT GIVE THEM TO YOUR DOCTOR. °A prescription for pain medication may be given to you upon discharge.  Take your pain medication as prescribed, if needed.  If narcotic pain medicine is not needed, then you may take acetaminophen (Tylenol) or ibuprofen (Advil) as needed. °1. Take your usually prescribed medications unless otherwise directed. °2. If you need a refill on your pain medication, please contact your pharmacy.  They will contact our office to request authorization.  Prescriptions will not be filled after 5pm or on week-ends. °3. You should follow a light diet the first few days after arrival home, such as soup and crackers, etc.  Resume your normal diet the day after surgery. °4. Most patients will experience some swelling and bruising on the chest and underarm.  Ice packs will help.  Swelling and bruising can take several days to resolve.  °5. It is common to experience some constipation if taking pain medication after surgery.  Increasing fluid intake and taking a stool softener (such as Colace) will usually help or prevent this problem from occurring.  A mild laxative (Milk of Magnesia or Miralax) should be taken according to package instructions if there are no bowel movements after 48 hours. °6. Unless discharge instructions indicate otherwise, leave your bandage dry and in place until your next appointment in 3-5 days.  You may take a limited sponge bath.  No tube baths or showers until the drains are removed.  You may have steri-strips (small skin tapes) in place directly over the incision.  These strips should be left on the skin for 7-10 days.  If your surgeon used skin glue on the incision, you may  shower in 24 hours.  The glue will flake off over the next 2-3 weeks.  Any sutures or staples will be removed at the office during your follow-up visit. °7. DRAINS:  If you have drains in place, it is important to keep a list of the amount of drainage produced each day in your drains.  Before leaving the hospital, you should be instructed on drain care.  Call our office if you have any questions about your drains. °8. ACTIVITIES:  You may resume regular (light) daily activities beginning the next day--such as daily self-care, walking, climbing stairs--gradually increasing activities as tolerated.  You may have sexual intercourse when it is comfortable.  Refrain from any heavy lifting or straining until approved by your doctor. °a. You may drive when you are no longer taking prescription pain medication, you can comfortably wear a seatbelt, and you can safely maneuver your car and apply brakes. °b. RETURN TO WORK:  __________________________________________________________ °9. You should see your doctor in the office for a follow-up appointment approximately 3-5 days after your surgery.  Your doctor’s nurse will typically make your follow-up appointment when she calls you with your pathology report.  Expect your pathology report 2-3 business days after your surgery.  You may call to check if you do not hear from us after three days.   °10. OTHER INSTRUCTIONS: ______________________________________________________________________________________________ ____________________________________________________________________________________________ °WHEN TO CALL YOUR DOCTOR: °1. Fever over 101.0 °2. Nausea and/or vomiting °3. Extreme swelling or bruising °4. Continued bleeding from incision. °5. Increased pain, redness, or drainage from the incision. °  The clinic staff is available to answer your questions during regular business hours.  Please don’t hesitate to call and ask to speak to one of the nurses for clinical  concerns.  If you have a medical emergency, go to the nearest emergency room or call 911.  A surgeon from [facility] is always on call at the hospital. °[address], [address], [place], [place]  [address] ? [address] [address], [place], [place]   [address] °[phone] ? [phone] ? FAX [phone] °Web site: [email or web address] °

## 2016-10-18 NOTE — Progress Notes (Signed)
Patient discharged to home with instructions and prescription. 

## 2016-10-25 ENCOUNTER — Ambulatory Visit (HOSPITAL_BASED_OUTPATIENT_CLINIC_OR_DEPARTMENT_OTHER): Payer: BC Managed Care – PPO | Admitting: Oncology

## 2016-10-25 ENCOUNTER — Other Ambulatory Visit: Payer: Self-pay | Admitting: *Deleted

## 2016-10-25 VITALS — BP 122/78 | HR 72 | Temp 97.9°F | Resp 18 | Ht 65.0 in | Wt 158.8 lb

## 2016-10-25 DIAGNOSIS — Z79810 Long term (current) use of selective estrogen receptor modulators (SERMs): Secondary | ICD-10-CM | POA: Diagnosis not present

## 2016-10-25 DIAGNOSIS — C50811 Malignant neoplasm of overlapping sites of right female breast: Secondary | ICD-10-CM

## 2016-10-25 DIAGNOSIS — Z853 Personal history of malignant neoplasm of breast: Secondary | ICD-10-CM

## 2016-10-25 DIAGNOSIS — D0511 Intraductal carcinoma in situ of right breast: Secondary | ICD-10-CM

## 2016-10-25 DIAGNOSIS — Z170 Estrogen receptor positive status [ER+]: Secondary | ICD-10-CM | POA: Diagnosis not present

## 2016-10-25 DIAGNOSIS — N61 Mastitis without abscess: Secondary | ICD-10-CM | POA: Insufficient documentation

## 2016-10-25 DIAGNOSIS — C50911 Malignant neoplasm of unspecified site of right female breast: Secondary | ICD-10-CM

## 2016-10-25 MED ORDER — CEPHALEXIN 500 MG PO CAPS: 500.0000 mg | ORAL_CAPSULE | Freq: Two times a day (BID) | ORAL | 0 refills | Status: DC

## 2016-10-25 MED ORDER — FLUCONAZOLE 100 MG PO TABS: 100.0000 mg | ORAL_TABLET | Freq: Every day | ORAL | 0 refills | Status: DC

## 2016-10-25 NOTE — Progress Notes (Signed)
[facility]  [phone] [phone]     ID: Lisa Salinas [employer] DOB: 03/02/1956  MR#: [id number]  CSN#:[id number]  Patient Care Team: Bedsole, Amy E, MD as PCP - General Magrinat, Gustav C, MD as Consulting Physician (Oncology) Thimmappa, Brinda, MD as Consulting Physician (Plastic Surgery) Ingram, Haywood, MD as Consulting Physician (General Surgery) MAGRINAT,GUSTAV C, MD OTHER MD:  CHIEF COMPLAINT: Estrogen receptor positive ductal carcinoma in situ  CURRENT TREATMENT: Recovering from recent surgery.   BREAST CANCER HISTORY: I saw [name] remotely for an invasive right-sided breast cancer initially diagnosed in 2006. We have requested the records for review, but from the patient'Salinas recollection: She was treated neoadjuvantly with tamoxifen, but on repeat imaging and there had been no significant response. She underwent right lumpectomy and sentinel lymph node sampling early in 2007. She tells me the lymph nodes were negative. She then received chemotherapy followed by radiation. She then took Aromasin for 5 years, and was discharged from follow-up here in 2012.  More recently, on 08/18/2016, she underwent bilateral screening mammography with tomography at the breast Center. This found the breast density to be category C. In the right breast there were calcifications warranting further evaluation and on 08/23/2016 she underwent a unilateral right diagnostic mammogram which found pleomorphic calcifications in the upper outer quadrant of the right breast spanning 0.5 cm. There was a second area of punctate calcifications adjacent to the biopsy site slightly more posterior.  On 08/25/2016 she underwent a biopsy of the main area of concern in the right breast, and this found ductal carcinoma in situ, grade 2, estrogen receptor 100% positive, progesterone receptor 10% positive, both with strong staining.  Her subsequent history is as detailed below   INTERVAL  HISTORY: Lisa Salinas returns today for follow-up and treatment of her estrogen receptor positive breast cancer accompanied by her daughter Lisa Salinas. Since her last visit here Lisa Salinas underwent right mastectomy and sentinel lymph node sampling, on 10/17/2016. The final pathology ([id number]) showed ductal carcinoma in situ, low-grade, with negative margins. All 5 sentinel lymph nodes were clear.  She had some pain after the surgery which led to taking narcotics. She also had what appeared to be a cellulitis and she was started on doxycycline for that there will as the rash has actually worsened and become very itchy. She was started on a Medrol Dosepak 2 days ago. She thinks thinks may be a little bit better.  Meanwhile she continues on tamoxifen, with good tolerance.  REVIEW OF SYSTEMS: At this point she is overwhelmed by the rash problem. She feels very itchy. She feels weak and concern. She wonders how long this problem is going to last. She has had no fevers, no bleeding, and no other complications. A detailed review of systems today was stable   PAST MEDICAL HISTORY: Past Medical History:  Diagnosis Date  . Anxiety state, unspecified   . Basal cell carcinoma (BCC) of face   . Breast cancer, right breast (HCC) 10/20/2004   Salinas/P lumpectomy (04/2005) and chemo (03/2005)  . Depression   . Dizziness and giddiness   . Dysrhythmia   . Family history of breast cancer   . Personal history of chemotherapy 03/2005  . Personal history of radiation therapy 05/2005    PAST SURGICAL HISTORY: Past Surgical History:  Procedure Laterality Date  . ABDOMINAL HYSTERECTOMY     "left my ovaries; no cancer"  . BASAL CELL CARCINOMA EXCISION Right    side of face   . BREAST BIOPSY   Bilateral    "2 on the left; 1 on the right; all benign"  . BREAST BIOPSY Right 10/20/2004   malignant  . BREAST EXCISIONAL BIOPSY Left   . BREAST LUMPECTOMY Right 04/2005  . COLONOSCOPY    . MASTECTOMY COMPLETE / SIMPLE Right  10/17/2016  . MASTECTOMY W/ SENTINEL NODE BIOPSY Right 10/17/2016   Procedure: RIGHT MASTECTOMY WITH SENTINEL LYMPH NODE BIOPSY;  Surgeon: Fanny Skates, MD;  Location: [facility];  Service: General;  Laterality: Right;  . TONSILLECTOMY  Childhood    FAMILY HISTORY Family History  Problem Relation Age of Onset  . Heart disease Mother   . Hypertension Mother   . Hyperlipidemia Mother   . Cancer Father 33       lung, smoker  . Breast cancer Maternal Aunt 22  . Breast cancer Paternal Aunt 15  . Breast cancer Maternal Aunt 80  . Breast cancer Cousin 28       paternal first cousin  The patient'Salinas father died at age 67 from lung cancer in the setting of tobacco abuse. The patient'Salinas mother is living at age 77. The patient has one sister, no brothers. The patient has multiple relatives with breast cancer on both sides of the family and was tested for the BRCA1 and 2 genes remotely and was found to be negative.  GYNECOLOGIC HISTORY:  No LMP recorded. Patient has had a hysterectomy. Menarche age 75, first live birth age 84, the patient is [place] P2. She underwent hysterectomy without salpingo-oophorectomy at age 33. She did not have hormone replacement.  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 Her [name] is a retired Airline pilot. He is now a used Teacher, early years/pre. Daughter Lisa Salinas works in the nutrition department of the [employer] school system. Son Lisa Salinas lives in [place] and is in Press photographer for [employer]. The patient has 3 granddaughters. She is a Psychologist, forensic.    ADVANCED DIRECTIVES:    HEALTH MAINTENANCE: Social History  Substance Use Topics  . Smoking status: Never Smoker  . Smokeless tobacco: Never Used  . Alcohol use No     Colonoscopy:Due 2018  PAP:  Bone density: Remote   Allergies  Allergen Reactions  . Sulfonamide Derivatives Rash    REACTION: Whelps    Current Outpatient Prescriptions  Medication Sig Dispense Refill  .  calcium citrate-vitamin D (CITRACAL+D) 315-200 MG-UNIT per tablet Take 1 tablet by mouth 2 (two) times daily.      Marland Kitchen HYDROcodone-acetaminophen (NORCO/VICODIN) 5-325 MG tablet Take 1-2 tablets by mouth every 4 (four) hours as needed for moderate pain. 30 tablet 0  . sertraline (ZOLOFT) 50 MG tablet Take 1 tablet (50 mg total) by mouth daily. 30 tablet 3  . tamoxifen (NOLVADEX) 20 MG tablet Take 20 mg by mouth daily.    . traZODone (DESYREL) 50 MG tablet Take 0.5-1 tablets (25-50 mg total) by mouth at bedtime as needed for sleep. 30 tablet 3   No current facility-administered medications for this visit.     OBJECTIVE: Middle-aged white woman In no acute distress  Vitals:   10/25/16 1439  BP: 122/78  Pulse: 72  Resp: 18  Temp: 97.9 F (36.6 C)     Body mass index is 26.43 kg/m.    ECOG FS:1 - Symptomatic but completely ambulatory  Sclerae unicteric, EOMs intact Oropharynx clear and moist No cervical or supraclavicular adenopathy Lungs no rales or rhonchi Heart regular rate and rhythm Abd soft, nontender, positive  bowel sounds MSK no focal spinal tenderness, no upper extremity lymphedema Neuro: nonfocal, well oriented, appropriate affect Breasts: The right breast is status post mastectomy and is imaged below.  Right chest wall 10/25/2016    LAB RESULTS:  CMP     Component Value Date/Time   NA 139 10/14/2016 1302   K 3.6 10/14/2016 1302   CL 105 10/14/2016 1302   CO2 27 10/14/2016 1302   GLUCOSE 117 (H) 10/14/2016 1302   BUN 12 10/14/2016 1302   CREATININE 0.79 10/17/2016 1828   CALCIUM 9.2 10/14/2016 1302   PROT 6.7 10/14/2016 1302   ALBUMIN 4.2 10/14/2016 1302   AST 19 10/14/2016 1302   ALT 16 10/14/2016 1302   ALKPHOS 42 10/14/2016 1302   BILITOT 0.5 10/14/2016 1302   GFRNONAA >60 10/17/2016 1828   GFRAA >60 10/17/2016 1828    No results found for: TOTALPROTELP, ALBUMINELP, A1GS, A2GS, BETS, BETA2SER, GAMS, MSPIKE, SPEI  No results found for: KPAFRELGTCHN,  LAMBDASER, KAPLAMBRATIO  Lab Results  Component Value Date   WBC 8.9 10/17/2016   NEUTROABS 2.7 10/14/2016   HGB 13.4 10/17/2016   HCT 41.2 10/17/2016   MCV 91.2 10/17/2016   PLT 152 10/17/2016      Chemistry      Component Value Date/Time   NA 139 10/14/2016 1302   K 3.6 10/14/2016 1302   CL 105 10/14/2016 1302   CO2 27 10/14/2016 1302   BUN 12 10/14/2016 1302   CREATININE 0.79 10/17/2016 1828      Component Value Date/Time   CALCIUM 9.2 10/14/2016 1302   ALKPHOS 42 10/14/2016 1302   AST 19 10/14/2016 1302   ALT 16 10/14/2016 1302   BILITOT 0.5 10/14/2016 1302       Lab Results  Component Value Date   LABCA2 22 08/31/2010    No components found for: LABCAN125  No results for input(Salinas): INR in the last 168 hours.  Urinalysis    Component Value Date/Time   COLORURINE lt. yellow 05/20/2010 1551   APPEARANCEUR Hazy 05/20/2010 1551   LABSPEC <1.005 05/20/2010 1551   PHURINE 6.0 05/20/2010 1551   HGBUR trace-lysed 05/20/2010 1551   BILIRUBINUR neg 08/03/2016 1033   KETONESUR negative 02/05/2015 0851   PROTEINUR neg 08/03/2016 1033   UROBILINOGEN 0.2 08/03/2016 1033   UROBILINOGEN 0.2 05/20/2010 1551   NITRITE neg 08/03/2016 1033   NITRITE negative 05/20/2010 1551   LEUKOCYTESUR Negative 08/03/2016 1033     STUDIES: No results found.   ELIGIBLE FOR AVAILABLE RESEARCH PROTOCOL: No  ASSESSMENT: 60 y.o. BRCA negative [place] woman  (1) status post right lumpectomy and sentinel lymph node sampling in January 2007 for a ypT1 ypN0 breast cancer  (a) status post neoadjuvant tamoxifen and Herceptin treatment with no significant response  (b) intermediate Oncotype score predicted a 10 year risk of recurrence outside the breast of 14% if she took tamoxifen for 5 years.  (c) status post adjuvant chemotherapy with weekly paclitaxel 8 doses  (d) status post adjuvant radiation  (e) completed 1 year of trastuzumab  (f) status post adjuvant  exemestane for 5 years, completed May 2012   (2) status post right breast upper outer quadrant biopsy 08/25/2016 for ductal carcinoma in situ, grade 2, estrogen and progesterone receptor positive  (3) right mastectomy 10/17/2016 confirmed ductal carcinoma in situ, grade 1, measuring 2.8 cm, with negative margins and all 5 sentinel lymph nodes clear.  (4) tamoxifen started 09/15/2016 in anticipation of surgical delays  (5) further   genetics testing 09/27/2016 through the Common Hereditary cancer panel.offered by [facility] i found no deleterious mutations in APC, ATM, AXIN2, BARD1, BMPR1A, BRCA1, BRCA2, BRIP1, CDH1, CDKN2A (p14ARF), CDKN2A (p16INK4a), CHEK2, CTNNA1, DICER1, EPCAM (Deletion/duplication testing only), GREM1 (promoter region deletion/duplication testing only), KIT, MEN1, MLH1, MSH2, MSH3, MSH6, MUTYH, NBN, NF1, NHTL1, PALB2, PDGFRA, PMS2, POLD1, POLE, PTEN, RAD50, RAD51C, RAD51D, SDHB, SDHC, SDHD, SMAD4, SMARCA4. STK11, TP53, TSC1, TSC2, and VHL.  The following genes were evaluated for sequence changes only: SDHA and HOXB13 c.251G>A variant only.  The report date is September 27, 2016.  (6) the patient has decided against reconstruction   PLAN: We spent well over 30 minutes going over her current situation. She understands that with ductal carcinoma in situ, surgery is curative. Accordingly she is done with this cancer.  She has decided against reconstruction, at least at this point.  Her rash is complex. It could be due to contact dermatitis from iodine or other material from her surgery, it could be a reaction to narcotics, it could be a reaction to the doxycycline that she was started on, or it could represent bruising. In addition there is an element that looks like yeast, and this is not unexpected when she is taking steroids to try to bring the inflammation down.  She has already taken herself off narcotics. She will use Tylenol with Motrin for pain control. I changed the doxycycline 2  cephalexin. I also added Diflucan. I wrote out for her how to take all these various medications.  At this point and it feels overwhelmed but I reassured her this is a temporary problem and she will be feeling considerably better I expect by this weekend.  I suggested she hold the tamoxifen for now since we have always complications. She will see me again in a month. We will likely resume it at that point.  She knows to call if she is not doing considerably better by this weekend.  Chauncey Cruel, MD   10/25/2016 2:48 PM Medical Oncology and Hematology [facility] [address] [place], [place] [address] Tel. [phone]    Fax. [phone]  Addendum:

## 2016-11-01 ENCOUNTER — Other Ambulatory Visit: Payer: Self-pay | Admitting: Surgery

## 2016-11-24 ENCOUNTER — Ambulatory Visit: Payer: BC Managed Care – PPO | Attending: General Surgery | Admitting: Physical Therapy

## 2016-11-24 DIAGNOSIS — M25611 Stiffness of right shoulder, not elsewhere classified: Secondary | ICD-10-CM | POA: Diagnosis present

## 2016-11-24 DIAGNOSIS — M25511 Pain in right shoulder: Secondary | ICD-10-CM | POA: Diagnosis present

## 2016-11-24 DIAGNOSIS — Z483 Aftercare following surgery for neoplasm: Secondary | ICD-10-CM | POA: Insufficient documentation

## 2016-11-24 DIAGNOSIS — M6281 Muscle weakness (generalized): Secondary | ICD-10-CM | POA: Diagnosis present

## 2016-11-24 NOTE — Therapy (Signed)
[place], [place], [address] Phone: [phone]   Fax:  [phone]  Physical Therapy Evaluation  Patient Details  Name: Lisa Salinas MRN: [id number] Date of Birth: 1956-01-29 Referring Provider: Dr. Dalbert Batman   Encounter Date: 11/24/2016      PT End of Session - 11/24/16 1805    Visit Number 1   Number of Visits 9   Date for PT Re-Evaluation 01/05/17  Pt delaying start of treatment    PT Start Time 1430   PT Stop Time 1520   PT Time Calculation (min) 50 min   Activity Tolerance Patient limited by pain   Behavior During Therapy [facility] for tasks assessed/performed      Past Medical History:  Diagnosis Date  . Anxiety state, unspecified   . Basal cell carcinoma (BCC) of face   . Breast cancer, right breast ([place]) 10/20/2004   S/P lumpectomy (04/2005) and chemo (03/2005)  . Depression   . Dizziness and giddiness   . Dysrhythmia   . Family history of breast cancer   . Personal history of chemotherapy 03/2005  . Personal history of radiation therapy 05/2005    Past Surgical History:  Procedure Laterality Date  . ABDOMINAL HYSTERECTOMY     "left my ovaries; no cancer"  . BASAL CELL CARCINOMA EXCISION Right    side of face   . BREAST BIOPSY Bilateral    "2 on the left; 1 on the right; all benign"  . BREAST BIOPSY Right 10/20/2004   malignant  . BREAST EXCISIONAL BIOPSY Left   . BREAST LUMPECTOMY Right 04/2005  . COLONOSCOPY    . MASTECTOMY COMPLETE / SIMPLE Right 10/17/2016  . MASTECTOMY W/ SENTINEL NODE BIOPSY Right 10/17/2016   Procedure: RIGHT MASTECTOMY WITH SENTINEL LYMPH NODE BIOPSY;  Surgeon: Fanny Skates, MD;  Location: [facility];  Service: General;  Laterality: Right;  . TONSILLECTOMY  Childhood    There were no vitals filed for this visit.       Subjective Assessment - 11/24/16 1448    Subjective Skin is almost healed, have been doing some exercise, feels some pulling in right  medial arm to medial elbow    Pertinent History right breast cancer recurrance ( radiation with first occurance july 2006)  with right mastectomy with 5 nodes removed on October 17, 2016 followed by skin reaction likely due to a type of skin prep. Will not have to have chemo and not a candidate for radiation since she has had it before.  will not have reconstruction.    Patient Stated Goals to get back to normal , "to be able to put a band in my hair"   Currently in Pain? No/denies            [facility] PT Assessment - 11/24/16 0001      Assessment   Medical Diagnosis breast cancer    Referring Provider Dr. Dalbert Batman    Onset Date/Surgical Date 10/17/16   Hand Dominance Right     Restrictions   Weight Bearing Restrictions No     Balance Screen   Has the patient fallen in the past 6 months No   Has the patient had a decrease in activity level because of a fear of falling?  No   Is the patient reluctant to leave their home because of a fear of falling?  No     Home Ecologist residence   Living Arrangements Spouse/significant other  Available Help at Discharge Available PRN/intermittently     Prior Function   Level of Independence Independent   Vocation Full time employment  out til Sept 11    Vocation Requirements computer work, stressfull environment   Leisure takes care of 55 yo mother, spends time with family      Cognition   Overall Cognitive Status Within Functional Limits for tasks assessed     Observation/Other Assessments   Observations pt with well approximated right mastectomy incision with small scabbed area at lateral edge,  She has visible fullness above incision and contraction of incision at medial portion (which she says is painful at night and keeps her from lying flat in the bed ) She has visible cording in right axilla, visible fullness in right lateral upper back and posterior axillary region.  reddened area around site of lateral  drainage tube.    Skin Integrity appears "tight" at lateral chest and congested above the incision    Quick DASH  36.36     Sensation   Light Touch Impaired by gross assessment   Additional Comments Pt reports numbness at lateral chest and medial upper arm      Coordination   Gross Motor Movements are Fluid and Coordinated No  pt winces with right shoulder abduction      Posture/Postural Control   Posture/Postural Control Postural limitations   Postural Limitations Rounded Shoulders;Forward head     AROM   Right Shoulder Extension 40 Degrees   Right Shoulder Flexion 118 Degrees   Right Shoulder ABduction 85 Degrees   Right Shoulder Internal Rotation 54 Degrees   Right Shoulder External Rotation 70 Degrees   Left Shoulder Extension 60 Degrees   Left Shoulder Flexion 155 Degrees   Left Shoulder ABduction 150 Degrees   Left Shoulder Internal Rotation 60 Degrees   Left Shoulder External Rotation 90 Degrees     Strength   Overall Strength Comments weakness with right scapular stabalization, RUE limited by pain    Right Shoulder Flexion 3-/5   Right Shoulder ABduction 2+/5   Left Shoulder Flexion 4/5   Left Shoulder ABduction 4/5     Palpation   Palpation comment guitar string and larger cording palpated in right axilla down in to right medial upper arm to elbow  incision is adherent to chest with very little skin excursion around it  Pec major muscle is frim and painful            LYMPHEDEMA/ONCOLOGY QUESTIONNAIRE - 11/24/16 1510      Right Upper Extremity Lymphedema   15 cm Proximal to Olecranon Process 30.3 cm   Olecranon Process 25.3 cm   15 cm Proximal to Ulnar Styloid Process 24 cm   Just Proximal to Ulnar Styloid Process 14.5 cm   Across Hand at [employer] 18 cm   At [place] of 2nd Digit 6 cm     Left Upper Extremity Lymphedema   15 cm Proximal to Olecranon Process 30 cm   Olecranon Process 25 cm   15 cm Proximal to Ulnar Styloid Process 24 cm   Just  Proximal to Ulnar Styloid Process 14.5 cm   Across Hand at [employer] 19 cm   At [place] of 2nd Digit 5.8 cm           Quick Dash - 11/24/16 0001    Open a tight or new jar Moderate difficulty   Do heavy household chores (wash walls, wash floors) Moderate difficulty   Carry  a shopping bag or briefcase Mild difficulty   Wash your back Moderate difficulty   Use a knife to cut food Mild difficulty   Recreational activities in which you take some force or impact through your arm, shoulder, or hand (golf, hammering, tennis) Mild difficulty   During the past week, to what extent has your arm, shoulder or hand problem interfered with your normal social activities with family, friends, neighbors, or groups? Slightly   During the past week, to what extent has your arm, shoulder or hand problem limited your work or other regular daily activities Modererately   Arm, shoulder, or hand pain. Mild   Tingling (pins and needles) in your arm, shoulder, or hand Mild   Difficulty Sleeping Moderate difficulty   DASH Score 36.36 %      Objective measurements completed on examination: See above findings.          [facility] Adult PT Treatment/Exercise - 11/24/16 0001      Shoulder Exercises: Supine   Other Supine Exercises supine dowel flexion and abuciotn .  Much pain and difficulty with abduciton.  Pt encouraged to go within the limits of her body and to breathe into the stretch    Other Supine Exercises hands behind head with pillow under elbow for pec major stretch                 PT Education - 11/24/16 1803    Education provided Yes   Education Details supine dowel rod exercise , pec major stretch  issued flyer for ABC class    Person(s) Educated Patient   Methods Explanation;Demonstration;Handout   Comprehension Verbalized understanding;Returned demonstration                [facility] Goals - 11/24/16 1815      CC Long Term Goal  #1   Title Pt will have  understanding of lymphedema risk reduction precautions.    Time 4   Period Weeks   Status New     CC Long Term Goal  #2   Title pt will be indepedent in home exercise program for range of motion and strength    Time 4   Period Weeks   Status New     CC Long Term Goal  #3   Title Pt will have right shoulder abduciton to 140 degrees so that she can easily  put a hair tie in her hair.    Baseline 85   Time 4   Period Weeks   Status New             Plan - 11/24/16 1806    Clinical Impression Statement Pt presents with decreased right shoulder range of motion, strength and pain with right axillary cording after a right mastectomy with 5 nodes removed complicated by a skin reaction post op.    History and Personal Factors relevant to plan of care: previous radiation to skin, 5 nodes removed, post op skin rash complication.    Clinical Presentation Evolving   Clinical Presentation due to: skin tightnening up after healing    Clinical Decision Making Moderate   Rehab Potential Good   Clinical Impairments Affecting Rehab Potential previously radiated skin , 5 nodes removed    PT Frequency 2x / week   PT Duration 4 weeks   PT Treatment/Interventions ADLs/Self Care Home Management;Patient/family education;Taping;Passive range of motion;Scar mobilization;Manual lymph drainage;Manual techniques;Therapeutic activities;Therapeutic exercise;DME Instruction   PT Next Visit Plan Sign pt up for ABC class,  Manual lymph drainage to right upper arm , above incision and posterior upper back area. Manual work to cording, Gentle P/AA/AROM to shoulder , meeks exercise with  encouragement for  deep breathing and chest opening  later consider strenth ABC vs community exercise and compressin sleeve   Recommended Other Services ABC class    Consulted and Agree with Plan of Care Patient      Patient will benefit from skilled therapeutic intervention in order to improve the following deficits and  impairments:  Decreased skin integrity, Increased edema, Decreased scar mobility, Decreased knowledge of precautions, Decreased activity tolerance, Decreased knowledge of use of DME, Decreased strength, Increased fascial restricitons, Impaired UE functional use, Pain, Increased muscle spasms, Impaired perceived functional ability, Postural dysfunction  Visit Diagnosis: Aftercare following surgery for neoplasm  Stiffness of right shoulder, not elsewhere classified  Acute pain of right shoulder  Muscle weakness (generalized)     Problem List Patient Active Problem List   Diagnosis Date Noted  . Cellulitis of right breast 10/25/2016  . Recurrent cancer of right breast ([place]) 10/17/2016  . Genetic testing 09/28/2016  . Family history of breast cancer   . 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 Major depression, recurrent ([place]) 09/13/2016  . Acute bronchitis 06/15/2015  . Palpitations 01/27/2015  . Recurrent UTI (urinary tract infection) 01/27/2015  . Congestion of both ears 09/10/2014  . Basal cell carcinoma of skin 08/19/2014  . Right hip pain 08/19/2014  . ANXIETY 03/27/2008  . Low back pain 08/27/2007  Donato Heinz. Owens Shark PT   Norwood Levo 11/24/2016, 6:18 PM  [place], [place], [address] Phone: [phone]   Fax:  [phone]  Name: DELITA CHIQUITO MRN: [id number] Date of Birth: November 06, 1955

## 2016-11-24 NOTE — Patient Instructions (Signed)
.  SHOULDER: Flexion - Supine (Cane)        Cancer Rehab [phone]    Hold cane in both hands. Raise arms up overhead. Do not allow back to arch. Hold _5__ seconds. Do __5-10__ times; __1-2__ times a day.   SELF ASSISTED WITH OBJECT: Shoulder Abduction / Adduction - Supine    Hold cane with both hands. Move both arms from side to side, keep elbows straight.  Hold when stretch felt for __5__ seconds. Repeat __5-10__ times; __1-2__ times a day. Once this becomes easier progress to third picture bringing affected arm towards ear by staying out to side. Same hold for _5_seconds. Repeat  _5-10_ times, _1-2_ times/day.  Shoulder Blade Stretch    Clasp fingers behind head with elbows touching in front of face. Pull elbows back while pressing shoulder blades together. Relax and hold as tolerated, can place pillow under elbow here for comfort as needed and to allow for prolonged stretch.  Repeat __5__ times. Do __1-2__ sessions per day.      Copyright  VHI. All rights reserved.       

## 2016-12-06 ENCOUNTER — Ambulatory Visit: Payer: BC Managed Care – PPO | Admitting: Physical Therapy

## 2016-12-06 DIAGNOSIS — M25511 Pain in right shoulder: Secondary | ICD-10-CM

## 2016-12-06 DIAGNOSIS — Z483 Aftercare following surgery for neoplasm: Secondary | ICD-10-CM | POA: Diagnosis not present

## 2016-12-06 DIAGNOSIS — M25611 Stiffness of right shoulder, not elsewhere classified: Secondary | ICD-10-CM

## 2016-12-06 NOTE — Patient Instructions (Signed)

## 2016-12-06 NOTE — Therapy (Signed)
[place], [place], [address] Phone: [phone]   Fax:  [phone]  Physical Therapy Treatment  Patient Details  Name: Lisa Salinas MRN: [id number] Date of Birth: [birth year] Referring Provider: Dr. Dalbert Batman   Encounter Date: 12/06/2016      PT End of Session - 12/06/16 1719    Visit Number 2   Number of Visits 9   Date for PT Re-Evaluation 01/05/17   PT Start Time 1430   PT Stop Time 1518   PT Time Calculation (min) 48 min   Activity Tolerance Patient limited by pain   Behavior During Therapy [facility] for tasks assessed/performed      Past Medical History:  Diagnosis Date  . Anxiety state, unspecified   . Basal cell carcinoma (BCC) of face   . Breast cancer, right breast ([place]) 10/20/2004   S/P lumpectomy (04/2005) and chemo (03/2005)  . Depression   . Dizziness and giddiness   . Dysrhythmia   . Family history of breast cancer   . Personal history of chemotherapy 03/2005  . Personal history of radiation therapy 05/2005    Past Surgical History:  Procedure Laterality Date  . ABDOMINAL HYSTERECTOMY     "left my ovaries; no cancer"  . BASAL CELL CARCINOMA EXCISION Right    side of face   . BREAST BIOPSY Bilateral    "2 on the left; 1 on the right; all benign"  . BREAST BIOPSY Right 10/20/2004   malignant  . BREAST EXCISIONAL BIOPSY Left   . BREAST LUMPECTOMY Right 04/2005  . COLONOSCOPY    . MASTECTOMY COMPLETE / SIMPLE Right 10/17/2016  . MASTECTOMY W/ SENTINEL NODE BIOPSY Right 10/17/2016   Procedure: RIGHT MASTECTOMY WITH SENTINEL LYMPH NODE BIOPSY;  Surgeon: Fanny Skates, MD;  Location: [facility];  Service: General;  Laterality: Right;  . TONSILLECTOMY  Childhood    There were no vitals filed for this visit.      Subjective Assessment - 12/06/16 1432    Subjective Feeling the cording/discomfort all the way down into the right hand now.   Currently in Pain? No/denies             [facility] PT Assessment - 12/06/16 0001      Observation/Other Assessments   Skin Integrity no scabbing today on incision     Palpation   Palpation comment cording not visible today; palpable mainly at anterior right axilla with arm in abduction                     [facility] Adult PT Treatment/Exercise - 12/06/16 0001      Shoulder Exercises: Supine   Horizontal ABduction AROM;Both;5 reps  for stretch, held 10 counts     Manual Therapy   Manual Therapy Soft tissue mobilization;Myofascial release;Scapular mobilization;Passive ROM;Neural Stretch   Manual therapy comments discomfort all at right pect insertion area today   Soft tissue mobilization to right chest mastectomy incision area; then to cording felt at anterior right axilla with arm in abduction  manipulation of cord very uncomfortable for patient   Myofascial Release crosshands technique at right chest in vertical and diagonal directions; also unidirectional release at right pect insertion area; right UE myofascial pulling; also crosshands with one hand at right anterior shoulder and other at distal upper arm   Passive ROM tried stretch to right shoulder in supine into er, abduction, and flexion; in supine over towel roll, pect minor stretch  pt. tense and  with poor tolerance   Neural Stretch to right UE, but with limited result                PT Education - 12/06/16 1517    Education provided Yes   Education Details about axillary web syndrome   Person(s) Educated Patient   Methods Handout   Comprehension Need further instruction                [facility] Goals - 11/24/16 1815      CC Long Term Goal  #1   Title Pt will have understanding of lymphedema risk reduction precautions.    Time 4   Period Weeks   Status New     CC Long Term Goal  #2   Title pt will be indepedent in home exercise program for range of motion and strength    Time 4   Period Weeks   Status New     CC Long  Term Goal  #3   Title Pt will have right shoulder abduciton to 140 degrees so that she can easily  put a hair tie in her hair.    Baseline 85   Time 4   Period Weeks   Status New            Plan - 12/06/16 1518    Clinical Impression Statement Pt. reported feeling looser after session, although some stretches were painful. She seemed pleased.  She was fairly limited with P/ROM by pain, localized toward pect insertion area. Therapist had difficulty seeing or feeling cording today, though patient reported she could feel it.  Some cording was palpated just at anterior right axilla, but this was painful for patient to tolerated mobilization there.   Rehab Potential Good   Clinical Impairments Affecting Rehab Potential previously radiated skin , 5 nodes removed    PT Frequency 2x / week   PT Duration 4 weeks   PT Treatment/Interventions ADLs/Self Care Home Management;Patient/family education;Taping;Passive range of motion;Scar mobilization;Manual lymph drainage;Manual techniques;Therapeutic activities;Therapeutic exercise;DME Instruction   PT Next Visit Plan Initial plan of care: Sign pt up for ABC class, Manual lymph drainage to right upper arm , above incision and posterior upper back area. Manual work to cording, Gentle P/AA/AROM to shoulder , meeks exercise with  encouragement for  deep breathing and chest opening  later consider strenth ABC vs community exercise and compressin sleeve   Consulted and Agree with Plan of Care Patient      Patient will benefit from skilled therapeutic intervention in order to improve the following deficits and impairments:  Decreased skin integrity, Increased edema, Decreased scar mobility, Decreased knowledge of precautions, Decreased activity tolerance, Decreased knowledge of use of DME, Decreased strength, Increased fascial restricitons, Impaired UE functional use, Pain, Increased muscle spasms, Impaired perceived functional ability, Postural  dysfunction  Visit Diagnosis: Aftercare following surgery for neoplasm  Stiffness of right shoulder, not elsewhere classified  Acute pain of right shoulder     Problem List Patient Active Problem List   Diagnosis Date Noted  . Cellulitis of right breast 10/25/2016  . Recurrent cancer of right breast ([place]) 10/17/2016  . Genetic testing 09/28/2016  . Family history of breast cancer   . Malignant neoplasm of overlapping sites of right breast in female, estrogen receptor positive ([place]) 09/16/2016  . 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 Major depression, recurrent ([place]) 09/13/2016  . Acute bronchitis 06/15/2015  . Palpitations 01/27/2015  . Recurrent UTI (  urinary tract infection) 01/27/2015  . Congestion of both ears 09/10/2014  . Basal cell carcinoma of skin 08/19/2014  . Right hip pain 08/19/2014  . ANXIETY 03/27/2008  . Low back pain 08/27/2007    Jackolyn Geron 12/06/2016, 5:22 PM  [place], [place], [address] Phone: [phone]   Fax:  [phone]  Name: KALEEN ROCHETTE MRN: [id number] Date of Birth: March 06, 1956  Serafina Royals, PT 12/06/16 5:22 PM

## 2016-12-07 ENCOUNTER — Ambulatory Visit (HOSPITAL_BASED_OUTPATIENT_CLINIC_OR_DEPARTMENT_OTHER): Payer: BC Managed Care – PPO | Admitting: Oncology

## 2016-12-07 VITALS — BP 114/75 | HR 69 | Temp 98.1°F | Resp 18 | Ht 65.0 in | Wt 157.4 lb

## 2016-12-07 DIAGNOSIS — D0511 Intraductal carcinoma in situ of right breast: Secondary | ICD-10-CM | POA: Diagnosis not present

## 2016-12-07 DIAGNOSIS — Z79810 Long term (current) use of selective estrogen receptor modulators (SERMs): Secondary | ICD-10-CM

## 2016-12-07 DIAGNOSIS — C50811 Malignant neoplasm of overlapping sites of right female breast: Secondary | ICD-10-CM

## 2016-12-07 DIAGNOSIS — Z170 Estrogen receptor positive status [ER+]: Secondary | ICD-10-CM | POA: Diagnosis not present

## 2016-12-07 DIAGNOSIS — C50911 Malignant neoplasm of unspecified site of right female breast: Secondary | ICD-10-CM

## 2016-12-07 NOTE — Progress Notes (Signed)
[facility]  Telephone:([phone]) [phone] Fax:(336) [phone]     ID: Lisa Salinas DOB: Jul 08, 1955  MR#: [id number]  LFK#:[id number]  Patient Care Team: Lisa Seltzer, MD as PCP - General Lisa Salinas, Lisa Hue, MD as Consulting Physician (Oncology) Lisa Fellows, MD as Consulting Physician (Plastic Surgery) Lisa Kelp, MD as Consulting Physician (General Surgery) Lisa Dell, MD OTHER MD:  CHIEF COMPLAINT: Estrogen receptor positive ductal carcinoma in situ  CURRENT TREATMENT: Recovering from recent surgery.   BREAST CANCER HISTORY: From the recent summary note:  I saw Lisa Salinas remotely for an invasive right-sided breast cancer initially diagnosed in 2006. We have requested the records for review, but from the patient's recollection: She was treated neoadjuvantly with tamoxifen, but on repeat imaging and there had been no significant response. She underwent right lumpectomy and sentinel lymph node sampling early in 2007. She tells me the lymph nodes were negative. She then received chemotherapy followed by radiation. She then took Aromasin for 5 years, and was discharged from follow-up here in 2012.  More recently, on 08/18/2016, she underwent bilateral screening mammography with tomography at the breast Center. This found the breast density to be category C. In the right breast there were calcifications warranting further evaluation and on 08/23/2016 she underwent a unilateral right diagnostic mammogram which found pleomorphic calcifications in the upper outer quadrant of the right breast spanning 0.5 cm. There was a second area of punctate calcifications adjacent to the biopsy site slightly more posterior.  On 08/25/2016 she underwent a biopsy of the main area of concern in the right breast, and this found ductal carcinoma in situ, grade 2, estrogen receptor 100% positive, progesterone receptor 10% positive, both with strong staining.  Her subsequent history is as  detailed below   INTERVAL HISTORY: Lisa Salinas returns today for follow-up of her noninvasive breast cancer and remote invasive breast cancer. At the last visit she had a significant rash, related to something that was used at the time of her surgery. This was most likely the iodine. Biopsy of the rash on 11/01/2016 showed ([id number]) a perivascular dermatitis with eosinophils. This is basically a skin hypersensitivity reaction. By now the rash is pretty much resolved. She is here today to discuss tamoxifen.  REVIEW OF SYSTEMS: She still has a small scab in her surgical incision, but aside from that a detailed review of systems today was noncontributory  PAST MEDICAL HISTORY: Past Medical History:  Diagnosis Date  . Anxiety state, unspecified   . Basal cell carcinoma (BCC) of face   . Breast cancer, right breast (HCC) 10/20/2004   S/P lumpectomy (04/2005) and chemo (03/2005)  . Depression   . Dizziness and giddiness   . Dysrhythmia   . Family history of breast cancer   . Personal history of chemotherapy 03/2005  . Personal history of radiation therapy 05/2005    PAST SURGICAL HISTORY: Past Surgical History:  Procedure Laterality Date  . ABDOMINAL HYSTERECTOMY     "left my ovaries; no cancer"  . BASAL CELL CARCINOMA EXCISION Right    side of face   . BREAST BIOPSY Bilateral    "2 on the left; 1 on the right; all benign"  . BREAST BIOPSY Right 10/20/2004   malignant  . BREAST EXCISIONAL BIOPSY Left   . BREAST LUMPECTOMY Right 04/2005  . COLONOSCOPY    . MASTECTOMY COMPLETE / SIMPLE Right 10/17/2016  . MASTECTOMY W/ SENTINEL NODE BIOPSY Right 10/17/2016   Procedure: RIGHT MASTECTOMY WITH SENTINEL LYMPH NODE BIOPSY;  Surgeon: Lisa Skates, MD;  Location: [place];  Service: General;  Laterality: Right;  . TONSILLECTOMY  Childhood    FAMILY HISTORY Family History  Problem Relation Age of Onset  . Heart disease Mother   . Hypertension Mother   . Hyperlipidemia Mother   . Cancer  Father 55       lung, smoker  . Breast cancer Maternal Aunt 85  . Breast cancer Paternal Aunt 80  . Breast cancer Maternal Aunt 7  . Breast cancer Cousin 59       paternal first cousin  The patient's father died at age 82 from lung cancer in the setting of tobacco abuse. The patient's mother is living at age 51. The patient has one sister, no brothers. The patient has multiple relatives with breast cancer on both sides of the family and was tested for the BRCA1 and 2 genes remotely and was found to be negative.  GYNECOLOGIC HISTORY:  No LMP recorded. Patient has had a hysterectomy. Menarche age 33, first live birth age 88, the patient is [place] P2. She underwent hysterectomy without salpingo-oophorectomy at age 57. She did not have hormone replacement.  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 Her husband Lisa Salinas is a retired Airline pilot. He is now a used Teacher, early years/pre. Daughter Lisa Salinas works in the nutrition department of the [employer] school system. Son Lisa Salinas lives in [place] and is in Press photographer for [employer]. The patient has 3 granddaughters. She is a Psychologist, forensic.    ADVANCED DIRECTIVES:    HEALTH MAINTENANCE: Social History  Substance Use Topics  . Smoking status: Never Smoker  . Smokeless tobacco: Never Used  . Alcohol use No     Colonoscopy:Due 2018  PAP:  Bone density: Remote   Allergies  Allergen Reactions  . Sulfonamide Derivatives Rash    REACTION: [place]    Current Outpatient Prescriptions  Medication Sig Dispense Refill  . calcium citrate-vitamin D (CITRACAL+D) 315-200 MG-UNIT per tablet Take 1 tablet by mouth 2 (two) times daily.      . cephALEXin (KEFLEX) 500 MG capsule Take 1 capsule (500 mg total) by mouth 2 (two) times daily. (Patient not taking: Reported on 11/24/2016) 10 capsule 0  . fluconazole (DIFLUCAN) 100 MG tablet Take 1 tablet (100 mg total) by mouth daily. (Patient not taking: Reported on 11/24/2016) 10 tablet 0   . HYDROcodone-acetaminophen (NORCO/VICODIN) 5-325 MG tablet Take 1-2 tablets by mouth every 4 (four) hours as needed for moderate pain. (Patient not taking: Reported on 11/24/2016) 30 tablet 0  . sertraline (ZOLOFT) 50 MG tablet Take 1 tablet (50 mg total) by mouth daily. 30 tablet 3  . tamoxifen (NOLVADEX) 20 MG tablet Take 20 mg by mouth daily.    . traZODone (DESYREL) 50 MG tablet Take 0.5-1 tablets (25-50 mg total) by mouth at bedtime as needed for sleep. 30 tablet 3   No current facility-administered medications for this visit.     OBJECTIVE: Middle-aged white womanWho appears stated age  61:   12/07/16 1516  BP: 114/75  Pulse: 69  Resp: 18  Temp: 98.1 F (36.7 C)  SpO2: 100%     Body mass index is 26.19 kg/m.    ECOG FS:1 - Symptomatic but completely ambulatory  Sclerae unicteric, pupils round and equal Oropharynx clear and moist No cervical or supraclavicular adenopathy Lungs no rales or rhonchi Heart regular rate and rhythm Abd soft, nontender, positive bowel sounds MSK no focal spinal  tenderness, no upper extremity lymphedema Neuro: nonfocal, well oriented, appropriate affect Breasts: The right breast is status post mastectomy. There is no evidence of local recurrence. The left breast is benign. Both axillae are benign. Skin: Previously noted rash has resolved, with no residuals  LAB RESULTS:  CMP     Component Value Date/Time   NA 139 10/14/2016 1302   K 3.6 10/14/2016 1302   CL 105 10/14/2016 1302   CO2 27 10/14/2016 1302   GLUCOSE 117 (H) 10/14/2016 1302   BUN 12 10/14/2016 1302   CREATININE 0.79 10/17/2016 1828   CALCIUM 9.2 10/14/2016 1302   PROT 6.7 10/14/2016 1302   ALBUMIN 4.2 10/14/2016 1302   AST 19 10/14/2016 1302   ALT 16 10/14/2016 1302   ALKPHOS 42 10/14/2016 1302   BILITOT 0.5 10/14/2016 1302   GFRNONAA >60 10/17/2016 1828   GFRAA >60 10/17/2016 1828    No results found for: TOTALPROTELP, ALBUMINELP, A1GS, A2GS, BETS, BETA2SER, GAMS,  MSPIKE, SPEI  No results found for: Nils Pyle, [facility]  Lab Results  Component Value Date   WBC 8.9 10/17/2016   NEUTROABS 2.7 10/14/2016   HGB 13.4 10/17/2016   HCT 41.2 10/17/2016   MCV 91.2 10/17/2016   PLT 152 10/17/2016      Chemistry      Component Value Date/Time   NA 139 10/14/2016 1302   K 3.6 10/14/2016 1302   CL 105 10/14/2016 1302   CO2 27 10/14/2016 1302   BUN 12 10/14/2016 1302   CREATININE 0.79 10/17/2016 1828      Component Value Date/Time   CALCIUM 9.2 10/14/2016 1302   ALKPHOS 42 10/14/2016 1302   AST 19 10/14/2016 1302   ALT 16 10/14/2016 1302   BILITOT 0.5 10/14/2016 1302       Lab Results  Component Value Date   LABCA2 22 08/31/2010    No components found for: [id number]  No results for input(s): INR in the last 168 hours.  Urinalysis    Component Value Date/Time   COLORURINE lt. yellow 05/20/2010 1551   APPEARANCEUR Hazy 05/20/2010 1551   LABSPEC <1.005 05/20/2010 1551   PHURINE 6.0 05/20/2010 1551   HGBUR trace-lysed 05/20/2010 1551   BILIRUBINUR neg 08/03/2016 1033   KETONESUR negative 02/05/2015 0851   PROTEINUR neg 08/03/2016 1033   UROBILINOGEN 0.2 08/03/2016 1033   UROBILINOGEN 0.2 05/20/2010 1551   NITRITE neg 08/03/2016 1033   NITRITE negative 05/20/2010 1551   LEUKOCYTESUR Negative 08/03/2016 1033     STUDIES: Skin biopsy discussed  ELIGIBLE FOR AVAILABLE RESEARCH PROTOCOL: No  ASSESSMENT: 61 y.o. BRCA negative [place] woman  (1) status post right lumpectomy and sentinel lymph node sampling in January 2007 for a ypT1 ypN0 breast cancer  (a) status post neoadjuvant tamoxifen and Herceptin treatment with no significant response  (b) intermediate Oncotype score predicted a 10 year risk of recurrence outside the breast of 14% if she took tamoxifen for 5 years.  (c) status post adjuvant chemotherapy with weekly paclitaxel 8 doses  (d) status post adjuvant radiation  (e) completed  1 year of trastuzumab  (f) status post adjuvant exemestane for 5 years, completed May 2012   (2) status post right breast upper outer quadrant biopsy 08/25/2016 for ductal carcinoma in situ, grade 2, estrogen and progesterone receptor positive  (3) right mastectomy 10/17/2016 confirmed ductal carcinoma in situ, grade 1, measuring 2.8 cm, with negative margins and all 5 sentinel lymph nodes clear.  (4) tamoxifen started 09/15/2016 in  anticipation of surgical delays  (5) further genetics testing 09/27/2016 through the Common Hereditary cancer panel.offered by [facility] i found no deleterious mutations in APC, ATM, AXIN2, BARD1, BMPR1A, BRCA1, BRCA2, BRIP1, CDH1, CDKN2A (p14ARF), CDKN2A (p16INK4a), CHEK2, CTNNA1, DICER1, EPCAM (Deletion/duplication testing only), GREM1 (promoter region deletion/duplication testing only), KIT, MEN1, MLH1, MSH2, MSH3, MSH6, MUTYH, NBN, NF1, NHTL1, PALB2, PDGFRA, PMS2, POLD1, POLE, PTEN, RAD50, RAD51C, RAD51D, SDHB, SDHC, SDHD, SMAD4, SMARCA4. STK11, TP53, TSC1, TSC2, and VHL.  The following genes were evaluated for sequence changes only: SDHA and HOXB13 c.251G>A variant only.  (6) the patient has decided against reconstruction   PLAN: We again reviewed her overall situation. Her ductal carcinoma in situ is almost certainly cured with mastectomy. She is now 11 years out from her right lumpectomy for invasive disease, and she took exemestane for 5 years after that.  I do not have data that continuing on tamoxifen at this point we will do anything further in terms of breast cancer risk reduction. It might or it might not.  Certainly she could continue tamoxifen with a review of improving bone density. This would be essentially using it the way we use raloxifene. The drugs are practically indistinguishable and so I would not be unreasonable if that were her concern. We did review the possible toxicities side effects and complications of tamoxifen including concerns regarding  blood clots.  After much discussion what she would like to do at this point is continuing tamoxifen at least 2 more years  She will see me again in a year. She knows to call for any problems that may develop before the next visit. Chauncey Cruel, MD   12/07/2016 3:35 PM Medical Oncology and Hematology [facility] [address] [place], [place] [address] Tel. [phone]    Fax. [phone]  Addendum:

## 2016-12-09 ENCOUNTER — Ambulatory Visit: Payer: BC Managed Care – PPO | Admitting: Physical Therapy

## 2016-12-09 DIAGNOSIS — M6281 Muscle weakness (generalized): Secondary | ICD-10-CM

## 2016-12-09 DIAGNOSIS — M25511 Pain in right shoulder: Secondary | ICD-10-CM

## 2016-12-09 DIAGNOSIS — M25611 Stiffness of right shoulder, not elsewhere classified: Secondary | ICD-10-CM

## 2016-12-09 DIAGNOSIS — Z483 Aftercare following surgery for neoplasm: Secondary | ICD-10-CM

## 2016-12-09 NOTE — Patient Instructions (Signed)
1. Decompression Exercise     Cancer Rehab [id number] ° ° ° °Lie on back on firm surface, knees bent, feet flat, arms turned up, out to sides, backs of hands down. Time _5-15__ minutes. °Surface: floor  ° °2. Shoulder Press ° ° ° °Start in Decompression Exercise position. Press shoulders downward towards supporting surface. Hold __2-3__ seconds while counting out loud. °Repeat _3-5___ times. Do _1-2___ times per day. ° ° °3. Head Press ° ° ° °Bring cervical spine (neck) into neutral position (by either tucking the chin towards the chest or tilting the chin upward). Feel weight on back of head. Press head downward into supporting surface.    Hold _2-3__ seconds. °Repeat _3-5__ times. Do _1-2__ times per day. °  °4. Leg Lengthener ° ° ° °Straighten one leg. Pull toes AND forefoot toward knee, extend heel. Lengthen leg by pulling pelvis away from ribs. Hold _2-3__ seconds. Relax. Repeat __4-6__ times. Do other leg.  °Surface: floor  ° °5. Leg Press ° ° ° °Straighten one leg down to floor keeping leg aligned with hip. Pull toes AND forefoot toward knee; extend heel.  Press entire leg downward (as if pressing leg into sandy beach). DO NOT BEND KNEE. Hold _2-3__ seconds. Do __4-6__ times. Repeat with other leg.  ° °SHOULDER: Flexion - Supine (Cane)        Cancer Rehab [phone] ° ° ° °Hold cane in both hands. Raise arms up overhead. Do not allow back to arch. Hold _5__ seconds. Do __5-10__ times; __1-2__ times a day. ° ° °SELF ASSISTED WITH OBJECT: Shoulder Abduction / Adduction - Supine ° ° ° °Hold cane with both hands. Move both arms from side to side, keep elbows straight.  Hold when stretch felt for __5__ seconds. Repeat __5-10__ times; __1-2__ times a day. Once this becomes easier progress to third picture bringing affected arm towards ear by staying out to side. Same hold for _5_seconds. Repeat  _5-10_ times, _1-2_ times/day. ° °Shoulder Blade Stretch ° ° ° °Clasp fingers behind head with elbows touching in front of  face. Pull elbows back while pressing shoulder blades together. Relax and hold as tolerated, can place pillow under elbow here for comfort as needed and to allow for prolonged stretch.  °Repeat __5__ times. Do __1-2__ sessions per day. ° ° ° ° °SHOULDER: External Rotation - Supine (Cane) ° ° ° °Hold cane with both hands. Rotate arm away from body. Keep elbow on floor and next to body. _5-10__ reps per set, hold 5 seconds, _1-2__ sets per day. °Add towel to keep elbow at side. ° °Copyright © [employer]. All rights reserved.  ° ° ° ° ° °

## 2016-12-09 NOTE — Therapy (Signed)
[place], [place], [address] Phone: [phone]   Fax:  [phone]  Physical Therapy Treatment  Patient Details  Name: Lisa Salinas MRN: [id number] Date of Birth: 06/15/1955 Referring Provider: Dr. Dalbert Batman   Encounter Date: 12/09/2016      PT End of Session - 12/09/16 0848    Visit Number 3   Number of Visits 9   Date for PT Re-Evaluation 01/05/17   PT Start Time 0800   PT Stop Time 0842   PT Time Calculation (min) 42 min   Activity Tolerance Patient tolerated treatment well   Behavior During Therapy [facility] for tasks assessed/performed      Past Medical History:  Diagnosis Date  . Anxiety state, unspecified   . Basal cell carcinoma (BCC) of face   . Breast cancer, right breast ([place]) 10/20/2004   S/P lumpectomy (04/2005) and chemo (03/2005)  . Depression   . Dizziness and giddiness   . Dysrhythmia   . Family history of breast cancer   . Personal history of chemotherapy 03/2005  . Personal history of radiation therapy 05/2005    Past Surgical History:  Procedure Laterality Date  . ABDOMINAL HYSTERECTOMY     "left my ovaries; no cancer"  . BASAL CELL CARCINOMA EXCISION Right    side of face   . BREAST BIOPSY Bilateral    "2 on the left; 1 on the right; all benign"  . BREAST BIOPSY Right 10/20/2004   malignant  . BREAST EXCISIONAL BIOPSY Left   . BREAST LUMPECTOMY Right 04/2005  . COLONOSCOPY    . MASTECTOMY COMPLETE / SIMPLE Right 10/17/2016  . MASTECTOMY W/ SENTINEL NODE BIOPSY Right 10/17/2016   Procedure: RIGHT MASTECTOMY WITH SENTINEL LYMPH NODE BIOPSY;  Surgeon: Fanny Skates, MD;  Location: [place];  Service: General;  Laterality: Right;  . TONSILLECTOMY  Childhood    There were no vitals filed for this visit.      Subjective Assessment - 12/09/16 6962    Subjective (P)  Pt has been using the cream on her incision. She had some neck pain after her treatment last session.      Pertinent History (P)  right breast cancer recurrance ( radiation with first occurance july 2006)  with right mastectomy with 5 nodes removed on October 17, 2016 followed by skin reaction likely due to a type of skin prep. Will not have to have chemo and not a candidate for radiation since she has had it before.  will not have reconstruction.    Patient Stated Goals (P)  to get back to normal , "to be able to put a band in my hair"                         [facility] Adult PT Treatment/Exercise - 12/09/16 0001      Self-Care   Self-Care Other Self-Care Comments   Other Self-Care Comments  gave ABC flyer and asked pt to schedule class      Exercises   Exercises --  Meeks decompression exercise      Shoulder Exercises: Supine   Protraction Strengthening;Right;10 reps   Other Supine Exercises supine dowel flexion and abuciotn .  Much pain and difficulty with abduciton.  Pt encouraged to go within the limits of her body and to breathe into the stretch    Other Supine Exercises small circles with hand pointed to ceiling      Shoulder Exercises:  Sidelying   External Rotation Strengthening;Right;10 reps   ABduction AROM;Right   ABduction Limitations limited by pain    Other Sidelying Exercises small circles with hand pointed to ceiling      Manual Therapy   Manual Therapy Manual Lymphatic Drainage (MLD)   Manual therapy comments cording in present in right axilla especailly observable in abduction    Myofascial Release to anterior chest at pec major muscle    Manual Lymphatic Drainage (MLD) briefly to anterior chest, right axilla and upper arm                 PT Education - 12/09/16 0847    Education provided Yes   Education Details meeks decompression , reinforced dowel exercise and added active protraction in supine    Person(s) Educated Patient   Methods Explanation;Demonstration;Handout   Comprehension Verbalized understanding;Returned demonstration                 [facility] Goals - 11/24/16 1815      CC Long Term Goal  #1   Title Pt will have understanding of lymphedema risk reduction precautions.    Time 4   Period Weeks   Status New     CC Long Term Goal  #2   Title pt will be indepedent in home exercise program for range of motion and strength    Time 4   Period Weeks   Status New     CC Long Term Goal  #3   Title Pt will have right shoulder abduciton to 140 degrees so that she can easily  put a hair tie in her hair.    Baseline 85   Time 4   Period Weeks   Status New            Plan - 12/09/16 0848    Clinical Impression Statement Pt is improving overall, but continues to be limited by pain in limits of shoulder movement especailly in abduciton.  Upgraded home exercise today    Clinical Impairments Affecting Rehab Potential previously radiated skin , 5 nodes removed    PT Next Visit Plan add pulleys and wall stretches continue with soft tissue work and stretching.  Add supine scapular strengtheing  Check if pt coming to ABC class and if not, teach lymphedem risk reduction when time.    Consulted and Agree with Plan of Care Patient      Patient will benefit from skilled therapeutic intervention in order to improve the following deficits and impairments:  Decreased skin integrity, Increased edema, Decreased scar mobility, Decreased knowledge of precautions, Decreased activity tolerance, Decreased knowledge of use of DME, Decreased strength, Increased fascial restricitons, Impaired UE functional use, Pain, Increased muscle spasms, Impaired perceived functional ability, Postural dysfunction  Visit Diagnosis: Stiffness of right shoulder, not elsewhere classified  Aftercare following surgery for neoplasm  Acute pain of right shoulder  Muscle weakness (generalized)     Problem List Patient Active Problem List   Diagnosis Date Noted  . Cellulitis of right breast 10/25/2016  . Recurrent cancer of  right breast ([place]) 10/17/2016  . Genetic testing 09/28/2016  . Family history of breast cancer   . Malignant neoplasm of overlapping sites of right breast in female, estrogen receptor positive ([place]) 09/16/2016  . 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 Major depression, recurrent ([place]) 09/13/2016  . Acute bronchitis 06/15/2015  . Palpitations 01/27/2015  . Recurrent UTI (urinary tract infection) 01/27/2015  . Congestion of  both ears 09/10/2014  . Basal cell carcinoma of skin 08/19/2014  . Right hip pain 08/19/2014  . ANXIETY 03/27/2008  . Low back pain 08/27/2007  Donato Heinz. Owens Shark PT   Norwood Levo 12/09/2016, 8:52 AM  [place], [place], [address] Phone: [phone]   Fax:  [phone]  Name: DAISHA FILOSA MRN: [id number] Date of Birth: Jan 06, 1956

## 2016-12-12 ENCOUNTER — Ambulatory Visit: Payer: BC Managed Care – PPO | Admitting: Physical Therapy

## 2016-12-12 DIAGNOSIS — M25511 Pain in right shoulder: Secondary | ICD-10-CM

## 2016-12-12 DIAGNOSIS — Z483 Aftercare following surgery for neoplasm: Secondary | ICD-10-CM | POA: Diagnosis not present

## 2016-12-12 DIAGNOSIS — M25611 Stiffness of right shoulder, not elsewhere classified: Secondary | ICD-10-CM

## 2016-12-12 NOTE — Patient Instructions (Signed)

## 2016-12-12 NOTE — Therapy (Signed)
[place], [place], [address] Phone: [phone]   Fax:  [phone]  Physical Therapy Treatment  Patient Details  Name: Lisa Salinas MRN: [id number] Date of Birth: May 12, 1955 Referring Provider: Dr. Dalbert Batman   Encounter Date: 12/12/2016      PT End of Session - 12/12/16 1653    Visit Number 4   Number of Visits 9   Date for PT Re-Evaluation 01/05/17   PT Start Time 1603   PT Stop Time 1649   PT Time Calculation (min) 46 min   Activity Tolerance Patient tolerated treatment well   Behavior During Therapy [facility] for tasks assessed/performed      Past Medical History:  Diagnosis Date  . Anxiety state, unspecified   . Basal cell carcinoma (BCC) of face   . Breast cancer, right breast ([place]) 10/20/2004   S/P lumpectomy (04/2005) and chemo (03/2005)  . Depression   . Dizziness and giddiness   . Dysrhythmia   . Family history of breast cancer   . Personal history of chemotherapy 03/2005  . Personal history of radiation therapy 05/2005    Past Surgical History:  Procedure Laterality Date  . ABDOMINAL HYSTERECTOMY     "left my ovaries; no cancer"  . BASAL CELL CARCINOMA EXCISION Right    side of face   . BREAST BIOPSY Bilateral    "2 on the left; 1 on the right; all benign"  . BREAST BIOPSY Right 10/20/2004   malignant  . BREAST EXCISIONAL BIOPSY Left   . BREAST LUMPECTOMY Right 04/2005  . COLONOSCOPY    . MASTECTOMY COMPLETE / SIMPLE Right 10/17/2016  . MASTECTOMY W/ SENTINEL NODE BIOPSY Right 10/17/2016   Procedure: RIGHT MASTECTOMY WITH SENTINEL LYMPH NODE BIOPSY;  Surgeon: Fanny Skates, MD;  Location: [facility];  Service: General;  Laterality: Right;  . TONSILLECTOMY  Childhood    There were no vitals filed for this visit.      Subjective Assessment - 12/12/16 1605    Subjective Got a sore neck after last visit as well.  Getting a headache in the morning, I don't know if that has to do  with sinuses.  As far as the neck hurting, it gets better when I get up and move around.  Has done the new exercises and doesn't have questions on that right now. When asked, she responds, "I can tell that I'm able to move better."   Pertinent History right breast cancer recurrance ( radiation with first occurance july 2006)  with right mastectomy with 5 nodes removed on October 17, 2016 followed by skin reaction likely due to a type of skin prep. Will not have to have chemo and not a candidate for radiation since she has had it before.  will not have reconstruction.    Currently in Pain? No/denies  but sometimes gets pains in right arm            [facility] PT Assessment - 12/12/16 0001      AROM   Right Shoulder Flexion 120 Degrees   Right Shoulder ABduction 150 Degrees                     [facility] Adult PT Treatment/Exercise - 12/12/16 0001      Shoulder Exercises: Supine   Horizontal ABduction Strengthening;Both;5 reps;Theraband   Theraband Level (Shoulder Horizontal ABduction) Level 1 (Yellow)   External Rotation Strengthening;Both;5 reps;Theraband   Theraband Level (Shoulder External Rotation) Level 1 (Yellow)  Flexion Strengthening;Both;5 reps;Theraband  wide and narrow grip   Theraband Level (Shoulder Flexion) Level 1 (Yellow)   Other Supine Exercises active D2 vs. yellow Theraband x 5 each side     Shoulder Exercises: Pulleys   Flexion 2 minutes   ABduction 2 minutes     Shoulder Exercises: Therapy Ball   Flexion 10 reps  stretch at top   ABduction 5 reps  right side     Manual Therapy   Soft tissue mobilization to right chest, gently   Myofascial Release to right chest in diagonal and vertical directions; to right pect major insertion area   Passive ROM stretch to right shoulder in supine into er, abduction, and flexion; in supine over towel roll, pect minor stretch  tolerance better today than last time this therapist tried                PT  Education - 12/12/16 1652    Education provided Yes   Education Details supine scapular series vs. yellow Theraband   Person(s) Educated Patient   Methods Explanation;Demonstration;Verbal cues;Handout   Comprehension Verbalized understanding;Returned demonstration                [facility] Goals - 12/12/16 1650      CC Long Term Goal  #1   Title Pt will have understanding of lymphedema risk reduction precautions.    Baseline still needs to sign up for ABC class as of 12/12/16   Status On-going     CC Long Term Goal  #2   Title pt will be indepedent in home exercise program for range of motion and strength    Status Partially Met     CC Long Term Goal  #3   Title Pt will have right shoulder abduciton to 140 degrees so that she can easily  put a hair tie in her hair.    Baseline 85 at eval; 150 on 12/12/16   Status Achieved            Plan - 12/12/16 1653    Clinical Impression Statement Pt. did well with doing pulleys and therapy ball on wall for AA/ROM; better tolerance for P/ROM today as well.  Her right shoulder flexion is little changed, but abduction increased from 85 to 150 degrees, so she met that goal today. Still with tight tissues at right chest and anterior shoulder   Rehab Potential Good   Clinical Impairments Affecting Rehab Potential previously radiated skin , 5 nodes removed    PT Frequency 2x / week   PT Duration 4 weeks   PT Treatment/Interventions ADLs/Self Care Home Management;Patient/family education;Taping;Passive range of motion;Scar mobilization;Manual lymph drainage;Manual techniques;Therapeutic activities;Therapeutic exercise;DME Instruction   PT Next Visit Plan Continue pulleys and wall stretches as well as soft tissue work and myofascial release.   Consulted and Agree with Plan of Care Patient      Patient will benefit from skilled therapeutic intervention in order to improve the following deficits and impairments:  Decreased skin  integrity, Increased edema, Decreased scar mobility, Decreased knowledge of precautions, Decreased activity tolerance, Decreased knowledge of use of DME, Decreased strength, Increased fascial restricitons, Impaired UE functional use, Pain, Increased muscle spasms, Impaired perceived functional ability, Postural dysfunction  Visit Diagnosis: Aftercare following surgery for neoplasm  Stiffness of right shoulder, not elsewhere classified  Acute pain of right shoulder     Problem List Patient Active Problem List   Diagnosis Date Noted  . Cellulitis of right breast 10/25/2016  .  Recurrent cancer of right breast ([place]) 10/17/2016  . Genetic testing 09/28/2016  . Family history of breast cancer   . Malignant neoplasm of overlapping sites of right breast in female, estrogen receptor positive ([place]) 09/16/2016  . 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 Major depression, recurrent ([place]) 09/13/2016  . Acute bronchitis 06/15/2015  . Palpitations 01/27/2015  . Recurrent UTI (urinary tract infection) 01/27/2015  . Congestion of both ears 09/10/2014  . Basal cell carcinoma of skin 08/19/2014  . Right hip pain 08/19/2014  . ANXIETY 03/27/2008  . Low back pain 08/27/2007    Reakwon Barren 12/12/2016, 4:56 PM  [place], [place], [address] Phone: [phone]   Fax:  [phone]  Name: Lisa Salinas MRN: [id number] Date of Birth: 1955/06/03  Serafina Royals, PT 12/12/16 4:56 PM

## 2016-12-14 ENCOUNTER — Encounter: Payer: BC Managed Care – PPO | Admitting: Physical Therapy

## 2016-12-14 ENCOUNTER — Encounter: Payer: Self-pay | Admitting: Family Medicine

## 2016-12-15 ENCOUNTER — Ambulatory Visit: Payer: BC Managed Care – PPO

## 2016-12-15 DIAGNOSIS — M6281 Muscle weakness (generalized): Secondary | ICD-10-CM

## 2016-12-15 DIAGNOSIS — M25511 Pain in right shoulder: Secondary | ICD-10-CM

## 2016-12-15 DIAGNOSIS — M25611 Stiffness of right shoulder, not elsewhere classified: Secondary | ICD-10-CM

## 2016-12-15 DIAGNOSIS — Z483 Aftercare following surgery for neoplasm: Secondary | ICD-10-CM

## 2016-12-15 NOTE — Therapy (Signed)
[place], [place], [address] Phone: [phone]   Fax:  [phone]  Physical Therapy Treatment  Patient Details  Name: Lisa Salinas MRN: [id number] Date of Birth: 12-01-1955 Referring Provider: Dr. Dalbert Batman   Encounter Date: 12/15/2016      PT End of Session - 12/15/16 0933    Visit Number 5   Number of Visits 9   Date for PT Re-Evaluation 01/05/17   PT Start Time 0849   PT Stop Time 0932   PT Time Calculation (min) 43 min   Activity Tolerance Patient tolerated treatment well   Behavior During Therapy [facility] for tasks assessed/performed      Past Medical History:  Diagnosis Date  . Anxiety state, unspecified   . Basal cell carcinoma (BCC) of face   . Breast cancer, right breast ([place]) 10/20/2004   S/P lumpectomy (04/2005) and chemo (03/2005)  . Depression   . Dizziness and giddiness   . Dysrhythmia   . Family history of breast cancer   . Personal history of chemotherapy 03/2005  . Personal history of radiation therapy 05/2005    Past Surgical History:  Procedure Laterality Date  . ABDOMINAL HYSTERECTOMY     "left my ovaries; no cancer"  . BASAL CELL CARCINOMA EXCISION Right    side of face   . BREAST BIOPSY Bilateral    "2 on the left; 1 on the right; all benign"  . BREAST BIOPSY Right 10/20/2004   malignant  . BREAST EXCISIONAL BIOPSY Left   . BREAST LUMPECTOMY Right 04/2005  . COLONOSCOPY    . MASTECTOMY COMPLETE / SIMPLE Right 10/17/2016  . MASTECTOMY W/ SENTINEL NODE BIOPSY Right 10/17/2016   Procedure: RIGHT MASTECTOMY WITH SENTINEL LYMPH NODE BIOPSY;  Surgeon: Fanny Skates, MD;  Location: [facility];  Service: General;  Laterality: Right;  . TONSILLECTOMY  Childhood    There were no vitals filed for this visit.      Subjective Assessment - 12/15/16 0900    Subjective Felt good after last visit. My headaches have been better and I'm figuring out my neck stiffness is mostly  just from posturing. Went to dermatologist yesterday and she removed 4 spots and froze 4 other spots.   Pertinent History right breast cancer recurrance ( radiation with first occurance july 2006)  with right mastectomy with 5 nodes removed on October 17, 2016 followed by skin reaction likely due to a type of skin prep. Will not have to have chemo and not a candidate for radiation since she has had it before.  will not have reconstruction.    Patient Stated Goals to get back to normal , "to be able to put a band in my hair"   Currently in Pain? No/denies                         [facility] Adult PT Treatment/Exercise - 12/15/16 0001      Shoulder Exercises: Pulleys   Flexion 2 minutes   ABduction 2 minutes     Shoulder Exercises: Therapy Ball   Flexion 10 reps  With forward lean into end of stretch   ABduction 10 reps  Rt UE with side lean into end of stretch     Shoulder Exercises: ROM/Strengthening   "W" Arms 5 times with 5 second holds, challenging for pt due to pec tightness   Other ROM/Strengthening Exercises Rt UE neural tension stretch on wall x5, 5 sec holds  Manual Therapy   Myofascial Release to right chest in diagonal and vertical directions; to right pect major insertion area   Passive ROM stretch to right shoulder in supine into er, abduction, and flexion; in supine over towel roll, pect minor stretch                        [facility] Goals - 12/12/16 1650      CC Long Term Goal  #1   Title Pt will have understanding of lymphedema risk reduction precautions.    Baseline still needs to sign up for ABC class as of 12/12/16   Status On-going     CC Long Term Goal  #2   Title pt will be indepedent in home exercise program for range of motion and strength    Status Partially Met     CC Long Term Goal  #3   Title Pt will have right shoulder abduciton to 140 degrees so that she can easily  put a hair tie in her hair.    Baseline 85 at  eval; 150 on 12/12/16   Status Achieved            Plan - 12/15/16 0933    Clinical Impression Statement Pt continued to tolerate AA/ROM stretching very well and did great with P/ROM stretching today. Did not require any VC's to relax today with this. She reported feeling good after session, "looser".    Rehab Potential Good   Clinical Impairments Affecting Rehab Potential previously radiated skin , 5 nodes removed    PT Frequency 2x / week   PT Duration 4 weeks   PT Treatment/Interventions ADLs/Self Care Home Management;Patient/family education;Taping;Passive range of motion;Scar mobilization;Manual lymph drainage;Manual techniques;Therapeutic activities;Therapeutic exercise;DME Instruction   PT Next Visit Plan Remeasure ROM next. Continue pulleys and wall stretches as well as soft tissue work and myofascial release.   Consulted and Agree with Plan of Care Patient      Patient will benefit from skilled therapeutic intervention in order to improve the following deficits and impairments:  Decreased skin integrity, Increased edema, Decreased scar mobility, Decreased knowledge of precautions, Decreased activity tolerance, Decreased knowledge of use of DME, Decreased strength, Increased fascial restricitons, Impaired UE functional use, Pain, Increased muscle spasms, Impaired perceived functional ability, Postural dysfunction  Visit Diagnosis: Aftercare following surgery for neoplasm  Stiffness of right shoulder, not elsewhere classified  Acute pain of right shoulder  Muscle weakness (generalized)     Problem List Patient Active Problem List   Diagnosis Date Noted  . Cellulitis of right breast 10/25/2016  . Recurrent cancer of right breast ([place]) 10/17/2016  . Genetic testing 09/28/2016  . Family history of breast cancer   . Malignant neoplasm of overlapping sites of right breast in female, estrogen receptor positive ([place]) 09/16/2016  . Ductal carcinoma in situ (DCIS) of right  breast 09/16/2016  . Major depression, recurrent ([place]) 09/13/2016  . Acute bronchitis 06/15/2015  . Palpitations 01/27/2015  . Recurrent UTI (urinary tract infection) 01/27/2015  . Congestion of both ears 09/10/2014  . Basal cell carcinoma of skin 08/19/2014  . Right hip pain 08/19/2014  . ANXIETY 03/27/2008  . Low back pain 08/27/2007    Lisa Salinas,PTA 12/15/2016, 9:35 AM  [place], [place], [address] Phone: [phone]   Fax:  [phone]  Name: Lisa Salinas MRN: [id number] Date of Birth: June 03, 1955

## 2016-12-20 ENCOUNTER — Ambulatory Visit: Payer: BC Managed Care – PPO | Attending: General Surgery | Admitting: Physical Therapy

## 2016-12-20 DIAGNOSIS — M25611 Stiffness of right shoulder, not elsewhere classified: Secondary | ICD-10-CM | POA: Diagnosis present

## 2016-12-20 DIAGNOSIS — Z483 Aftercare following surgery for neoplasm: Secondary | ICD-10-CM | POA: Insufficient documentation

## 2016-12-20 DIAGNOSIS — M25511 Pain in right shoulder: Secondary | ICD-10-CM | POA: Insufficient documentation

## 2016-12-20 DIAGNOSIS — M6281 Muscle weakness (generalized): Secondary | ICD-10-CM | POA: Diagnosis present

## 2016-12-20 NOTE — Therapy (Signed)
[place], [place], [address] Phone: [phone]   Fax:  [phone]  Physical Therapy Treatment  Patient Details  Name: Lisa Salinas MRN: [id number] Date of Birth: 1956-01-29 Referring Provider: Dr. Dalbert Batman   Encounter Date: 12/20/2016      PT End of Session - 12/20/16 1447    Visit Number 6   Number of Visits 9   Date for PT Re-Evaluation 01/05/17   PT Start Time 6294   PT Stop Time 1430   PT Time Calculation (min) 45 min   Activity Tolerance Patient tolerated treatment well   Behavior During Therapy [facility] for tasks assessed/performed      Past Medical History:  Diagnosis Date  . Anxiety state, unspecified   . Basal cell carcinoma (BCC) of face   . Breast cancer, right breast ([place]) 10/20/2004   S/P lumpectomy (04/2005) and chemo (03/2005)  . Depression   . Dizziness and giddiness   . Dysrhythmia   . Family history of breast cancer   . Personal history of chemotherapy 03/2005  . Personal history of radiation therapy 05/2005    Past Surgical History:  Procedure Laterality Date  . ABDOMINAL HYSTERECTOMY     "left my ovaries; no cancer"  . BASAL CELL CARCINOMA EXCISION Right    side of face   . BREAST BIOPSY Bilateral    "2 on the left; 1 on the right; all benign"  . BREAST BIOPSY Right 10/20/2004   malignant  . BREAST EXCISIONAL BIOPSY Left   . BREAST LUMPECTOMY Right 04/2005  . COLONOSCOPY    . MASTECTOMY COMPLETE / SIMPLE Right 10/17/2016  . MASTECTOMY W/ SENTINEL NODE BIOPSY Right 10/17/2016   Procedure: RIGHT MASTECTOMY WITH SENTINEL LYMPH NODE BIOPSY;  Surgeon: Fanny Skates, MD;  Location: [facility];  Service: General;  Laterality: Right;  . TONSILLECTOMY  Childhood    There were no vitals filed for this visit.      Subjective Assessment - 12/20/16 1356    Subjective Pt is doing stretching at home.  She still has some "pulling" that goes down in to forearm when she turns her  arm a certain way. she does not have her compression sleeve yet.  She just had    Pertinent History right breast cancer recurrance ( radiation with first occurance july 2006)  with right mastectomy with 5 nodes removed on October 17, 2016 followed by skin reaction likely due to a type of skin prep. Will not have to have chemo and not a candidate for radiation since she has had it before.  will not have reconstruction.    Patient Stated Goals to get back to normal , "to be able to put a band in my hair"  12/20/2016  pt has met her goal of being able to tie her hair back into ponytail             [facility] PT Assessment - 12/20/16 0001      AROM   Right Shoulder Flexion 146 Degrees   Right Shoulder ABduction 163 Degrees                     [facility] Adult PT Treatment/Exercise - 12/20/16 0001      Self-Care   Self-Care Other Self-Care Comments   Other Self-Care Comments  issued small tg soft for pt to try for right UE to try to help with cording      Shoulder Exercises: Sidelying  Other Sidelying Exercises small circles with hand pointed to ceiling    Other Sidelying Exercises horizontal abduction with backward upper thoracic rotation      Shoulder Exercises: Pulleys   Flexion 2 minutes   ABduction 2 minutes     Shoulder Exercises: Therapy Ball   Flexion 10 reps  With forward lean into end of stretch   ABduction 10 reps  Rt UE with side lean into end of stretch     Shoulder Exercises: Stretch   Other Shoulder Stretches doorway stretch with arm at 90 degrees abduction  and with both arms overhead in Y position      Manual Therapy   Manual Therapy Manual Lymphatic Drainage (MLD)   Manual therapy comments pt can still feel cording, but it seems to be less visible    Manual Lymphatic Drainage (MLD) instructing pt as I performed short neck, superficial and deep abdominals, left axillary nodes, anterior interaxillay anastamosis, right shoulder ,upper arm , and into forearm. then to  sidelying for posterior interaxillary anastamois, and back.                         [facility] Clinic Goals - 12/20/16 1449      CC Long Term Goal  #1   Title Pt will have understanding of lymphedema risk reduction precautions.    Baseline still needs to sign up for ABC class as of 12/12/16   Status On-going     CC Long Term Goal  #2   Title pt will be indepedent in home exercise program for range of motion and strength    Time 4   Period Weeks   Status On-going     CC Long Term Goal  #3   Title Pt will have right shoulder abduciton to 140 degrees so that she can easily  put a hair tie in her hair.    Baseline 85 at eval; 150 on 12/12/16, 163 on 12/20/2016   Status Achieved            Plan - 12/20/16 1450    Clinical Impression Statement Pt has met her goals for ROM today and is ready to progress to strengthening. She continues to feels some symptoms of cording today, so this was addressed today with MLD and trial of Tg soft.  Prescription request for compression sleeve sent to Dr. Dalbert Batman    Clinical Impairments Affecting Rehab Potential previously radiated skin , 5 nodes removed    PT Next Visit Plan check for cording.  Teach Strength ABC program.  Review lymphedema reduction precautions if pt will not attend ABC class    Consulted and Agree with Plan of Care Patient      Patient will benefit from skilled therapeutic intervention in order to improve the following deficits and impairments:     Visit Diagnosis: Aftercare following surgery for neoplasm  Stiffness of right shoulder, not elsewhere classified  Acute pain of right shoulder  Muscle weakness (generalized)     Problem List Patient Active Problem List   Diagnosis Date Noted  . Cellulitis of right breast 10/25/2016  . Recurrent cancer of right breast ([place]) 10/17/2016  . Genetic testing 09/28/2016  . Family history of breast cancer   . Malignant neoplasm of overlapping sites of right breast in  female, estrogen receptor positive ([place]) 09/16/2016  . 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 Major depression, recurrent ([place]) 09/13/2016  . Acute bronchitis 06/15/2015  .  Palpitations 01/27/2015  . Recurrent UTI (urinary tract infection) 01/27/2015  . Congestion of both ears 09/10/2014  . Basal cell carcinoma of skin 08/19/2014  . Right hip pain 08/19/2014  . ANXIETY 03/27/2008  . Low back pain 08/27/2007   Donato Heinz. Owens Shark PT  Norwood Levo 12/20/2016, 2:53 PM  [place], [place], [address] Phone: [phone]   Fax:  [phone]  Name: Lisa Salinas MRN: [id number] Date of Birth: [birth year]

## 2016-12-22 ENCOUNTER — Ambulatory Visit: Payer: BC Managed Care – PPO | Admitting: Family Medicine

## 2016-12-22 ENCOUNTER — Ambulatory Visit: Payer: BC Managed Care – PPO | Admitting: Physical Therapy

## 2016-12-22 DIAGNOSIS — Z483 Aftercare following surgery for neoplasm: Secondary | ICD-10-CM

## 2016-12-22 DIAGNOSIS — M25511 Pain in right shoulder: Secondary | ICD-10-CM

## 2016-12-22 DIAGNOSIS — M25611 Stiffness of right shoulder, not elsewhere classified: Secondary | ICD-10-CM

## 2016-12-22 DIAGNOSIS — M6281 Muscle weakness (generalized): Secondary | ICD-10-CM

## 2016-12-22 NOTE — Patient Instructions (Addendum)
[email or web address] Courses Online Strength After Breast Cancer Look at the right of the page for Lymphedema Education Session    Flexion (Isometric)      Cancer Rehab [phone]    Press right fist against wall. Hold __5__ seconds. Repeat _5-10___ times. Do __1-2__ sessions per day.  SHOULDER: Abduction (Isometric)    Use wall as resistance. Press arm against pillow. Hold _5__ seconds. _5-10__ times. Do _1-2__ sessions per day.    External Rotation (Isometric)    Place back of left fist against door frame, with elbow bent. Press fist against door frame. Hold __5__ seconds. Repeat _5-10___ times. Do _1-2___ sessions per day.  Extension (Isometric)    Place left bent elbow and back of arm against wall. Press elbow against wall. Hold __5__ seconds. Repeat _5-10___ times. Do _1-2___ sessions per day.

## 2016-12-22 NOTE — Therapy (Signed)
[facility] [address] [place], [place], [address] Phone: [phone]   Fax:  [phone]  Physical Therapy Treatment  Patient Details  Name: Lisa Salinas MRN: [id number] Date of Birth: 30-Nov-1955 Referring Provider: Dr. Derrell Lolling   Encounter Date: 12/22/2016      PT End of Session - 12/22/16 1250    Visit Number 7   Number of Visits 9   Date for PT Re-Evaluation 01/05/17   PT Start Time 1015   PT Stop Time 1105   PT Time Calculation (min) 50 min   Activity Tolerance Patient tolerated treatment well   Behavior During Therapy [facility] for tasks assessed/performed      Past Medical History:  Diagnosis Date  . Anxiety state, unspecified   . Basal cell carcinoma (BCC) of face   . Breast cancer, right breast (HCC) 10/20/2004   S/P lumpectomy (04/2005) and chemo (03/2005)  . Depression   . Dizziness and giddiness   . Dysrhythmia   . Family history of breast cancer   . Personal history of chemotherapy 03/2005  . Personal history of radiation therapy 05/2005    Past Surgical History:  Procedure Laterality Date  . ABDOMINAL HYSTERECTOMY     "left my ovaries; no cancer"  . BASAL CELL CARCINOMA EXCISION Right    side of face   . BREAST BIOPSY Bilateral    "2 on the left; 1 on the right; all benign"  . BREAST BIOPSY Right 10/20/2004   malignant  . BREAST EXCISIONAL BIOPSY Left   . BREAST LUMPECTOMY Right 04/2005  . COLONOSCOPY    . MASTECTOMY COMPLETE / SIMPLE Right 10/17/2016  . MASTECTOMY W/ SENTINEL NODE BIOPSY Right 10/17/2016   Procedure: RIGHT MASTECTOMY WITH SENTINEL LYMPH NODE BIOPSY;  Surgeon: Claud Kelp, MD;  Location: [facility] OR;  Service: General;  Laterality: Right;  . TONSILLECTOMY  Childhood    There were no vitals filed for this visit.      Subjective Assessment - 12/22/16 1021    Subjective Pt thinks the tg soft helped with the pulling in her arm.  She had been wearing it a few hours a day.  She  still can feel the pulling when she moves her arm a certains way ( abduction and internal rotatio    Pertinent History right breast cancer recurrance ( radiation with first occurance july 2006)  with right mastectomy with 5 nodes removed on October 17, 2016 followed by skin reaction likely due to a type of skin prep. Will not have to have chemo and not a candidate for radiation since she has had it before.  will not have reconstruction.    Patient Stated Goals to get back to normal , "to be able to put a band in my hair"  12/20/2016  pt has met her goal of being able to tie her hair back into ponytail                          [facility] Adult PT Treatment/Exercise - 12/22/16 0001      Self-Care   Self-Care Other Self-Care Comments   Other Self-Care Comments  prescription received from Dr. Derrell Lolling for compression sleeve and it was issued to patient to go to [facility] to get sleeve      Exercises   Exercises Other Exercises   Other Exercises  Instructed in basics of and stretches and core exercise   with beginning chest press exercise  of Strength ABC program, Pt had clicking in left shoulder with chest presses so added shoulder isometrics      Shoulder Exercises: Isometric Strengthening   Flexion 3X5"   Flexion Limitations all isometrics in standing with instruction to do with both arms    Extension 3X5"   ABduction 5X5"                PT Education - 12/22/16 1249    Education provided Yes   Education Details klosetraining lymphedema education session, Strength ABC stretches and core exercise , isometric shoulder exercise    Person(s) Educated Patient   Methods Explanation;Demonstration;Tactile cues;Verbal cues;Handout   Comprehension Verbalized understanding;Returned demonstration;Need further instruction                [facility] Goals - 12/20/16 1449      CC Long Term Goal  #1   Title Pt will have understanding of lymphedema risk reduction  precautions.    Baseline still needs to sign up for ABC class as of 12/12/16   Status On-going     CC Long Term Goal  #2   Title pt will be indepedent in home exercise program for range of motion and strength    Time 4   Period Weeks   Status On-going     CC Long Term Goal  #3   Title Pt will have right shoulder abduciton to 140 degrees so that she can easily  put a hair tie in her hair.    Baseline 85 at eval; 150 on 12/12/16, 163 on 12/20/2016   Status Achieved            Plan - 12/22/16 1251    Clinical Impression Statement Pt very attentive to instruction and able to perform stretching and core exercise.  Discussed addition of modified downward dog on wall for trunk stretch, sidelying clam exercise instead of curl ups, and using a chair for quaduped opposite arm and leg as pt has some pain in her wrists and difficutly with this exercise. Pt is getting benefit for her cording  with light compression and will received her prescription for her compressin sleeve.    Rehab Potential Good   Clinical Impairments Affecting Rehab Potential previously radiated skin , 5 nodes removed    PT Frequency 2x / week   PT Treatment/Interventions ADLs/Self Care Home Management;Patient/family education;Taping;Passive range of motion;Scar mobilization;Manual lymph drainage;Manual techniques;Therapeutic activities;Therapeutic exercise;DME Instruction   PT Next Visit Plan continue with Strength ABC program weight training portions  Review lymphedema reduction precautions if pt will not attend ABC class Issue that handout.    Consulted and Agree with Plan of Care Patient      Patient will benefit from skilled therapeutic intervention in order to improve the following deficits and impairments:  Decreased skin integrity, Increased edema, Decreased scar mobility, Decreased knowledge of precautions, Decreased activity tolerance, Decreased knowledge of use of DME, Decreased strength, Increased fascial  restricitons, Impaired UE functional use, Pain, Increased muscle spasms, Impaired perceived functional ability, Postural dysfunction  Visit Diagnosis: Aftercare following surgery for neoplasm  Stiffness of right shoulder, not elsewhere classified  Acute pain of right shoulder  Muscle weakness (generalized)     Problem List Patient Active Problem List   Diagnosis Date Noted  . Cellulitis of right breast 10/25/2016  . Recurrent cancer of right breast ([place]) 10/17/2016  . Genetic testing 09/28/2016  . Family history of breast cancer   . Malignant neoplasm of overlapping sites of right  breast in female, estrogen receptor positive ([place]) 09/16/2016  . 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 Major depression, recurrent ([place]) 09/13/2016  . Acute bronchitis 06/15/2015  . Palpitations 01/27/2015  . Recurrent UTI (urinary tract infection) 01/27/2015  . Congestion of both ears 09/10/2014  . Basal cell carcinoma of skin 08/19/2014  . Right hip pain 08/19/2014  . ANXIETY 03/27/2008  . Low back pain 08/27/2007   Donato Heinz. Owens Shark PT  [place] Levo 12/22/2016, 12:55 PM  [place], [place], [address] Phone: [phone]   Fax:  [phone]  Name: NAYLEEN JANOSIK MRN: [id number] Date of Birth: [birth year]

## 2016-12-27 ENCOUNTER — Ambulatory Visit: Payer: BC Managed Care – PPO | Admitting: Physical Therapy

## 2016-12-27 DIAGNOSIS — Z483 Aftercare following surgery for neoplasm: Secondary | ICD-10-CM

## 2016-12-27 DIAGNOSIS — M6281 Muscle weakness (generalized): Secondary | ICD-10-CM

## 2016-12-27 DIAGNOSIS — M25511 Pain in right shoulder: Secondary | ICD-10-CM

## 2016-12-27 DIAGNOSIS — M25611 Stiffness of right shoulder, not elsewhere classified: Secondary | ICD-10-CM

## 2016-12-27 NOTE — Therapy (Signed)
[place], [place], [address] Phone: [phone]   Fax:  [phone]  Physical Therapy Treatment  Patient Details  Name: Lisa Salinas MRN: [id number] Date of Birth: 06-15-1955 Referring Provider: Dr. Dalbert Batman   Encounter Date: 12/27/2016      PT End of Session - 12/27/16 1154    Visit Number 9   Number of Visits 9   Date for PT Re-Evaluation 01/05/17   PT Start Time 1100   PT Stop Time 1145   PT Time Calculation (min) 45 min      Past Medical History:  Diagnosis Date  . Anxiety state, unspecified   . Basal cell carcinoma (BCC) of face   . Breast cancer, right breast ([place]) 10/20/2004   S/P lumpectomy (04/2005) and chemo (03/2005)  . Depression   . Dizziness and giddiness   . Dysrhythmia   . Family history of breast cancer   . Personal history of chemotherapy 03/2005  . Personal history of radiation therapy 05/2005    Past Surgical History:  Procedure Laterality Date  . ABDOMINAL HYSTERECTOMY     "left my ovaries; no cancer"  . BASAL CELL CARCINOMA EXCISION Right    side of face   . BREAST BIOPSY Bilateral    "2 on the left; 1 on the right; all benign"  . BREAST BIOPSY Right 10/20/2004   malignant  . BREAST EXCISIONAL BIOPSY Left   . BREAST LUMPECTOMY Right 04/2005  . COLONOSCOPY    . MASTECTOMY COMPLETE / SIMPLE Right 10/17/2016  . MASTECTOMY W/ SENTINEL NODE BIOPSY Right 10/17/2016   Procedure: RIGHT MASTECTOMY WITH SENTINEL LYMPH NODE BIOPSY;  Surgeon: Fanny Skates, MD;  Location: [facility];  Service: General;  Laterality: Right;  . TONSILLECTOMY  Childhood    There were no vitals filed for this visit.      Subjective Assessment - 12/27/16 1116    Subjective Pt states she got her compression sleeve the other day "Its tight"  She has worn it a couple of times.  She feels like the cording is better in her arm    Pertinent History right breast cancer recurrance ( radiation with  first occurance july 2006)  with right mastectomy with 5 nodes removed on October 17, 2016 followed by skin reaction likely due to a type of skin prep. Will not have to have chemo and not a candidate for radiation since she has had it before.  will not have reconstruction.    Patient Stated Goals to get back to normal , "to be able to put a band in my hair"  12/20/2016  pt has met her goal of being able to tie her hair back into ponytail   she still has numbness and a little bit of tightness   Currently in Pain? No/denies                         [facility] Adult PT Treatment/Exercise - 12/27/16 0001      Self-Care   Self-Care Other Self-Care Comments   Other Self-Care Comments  Reviewed lymphedema risk reduction practices.  Pt will sign up for ABC class in October  Issued white foam pad to wear inside bra at lateral incision      Exercises   Other Exercises  pt reports she has no questions about Strength ABC program and reports she knows how she will advance the weights.      Shoulder Exercises: Sidelying  Other Sidelying Exercises small circles with hand pointed to ceiling    Other Sidelying Exercises horizontal abduction with backward upper thoracic rotation      Manual Therapy   Myofascial Release to right chest in diagonal and vertical directions, thick cord noticed coming from incision at lateral area of convergence going to axilla, Pt able to identify with her fingers and with mirror for visual cues                         [facility] Goals - 12/27/16 1119      CC Long Term Goal  #1   Title Pt will have understanding of lymphedema risk reduction precautions.    Baseline achieved. Pt still may come to the ABC class    Status Achieved     CC Long Term Goal  #2   Title pt will be indepedent in home exercise program for range of motion and strength    Status Achieved     CC Long Term Goal  #3   Title Pt will have right shoulder abduciton to 140 degrees  so that she can easily  put a hair tie in her hair.    Baseline 85 at eval; 150 on 12/12/16, 163 on 12/20/2016   Status Achieved     CC Long Term Goal  #4   Title Pt reports she knows how to stretch to reduce cording in anterior chest and to do scar massage in this area    Time 1   Period Weeks   Status New            Plan - 12/27/16 1154    Clinical Impression Statement Pt reports she has no questions about Strength ABC program and will advance exercise as she is able.  She acknowledged that she needs to continue exercise moving forward.  She says her cording is better in her arm, but noticed a new thck cord at lateral incision to axilla today Added foam pad to wear in her bra at this area .  New goal set so next visit will focus on soft tissue work in this area    Clinical Impairments Affecting Rehab Potential previously radiated skin , 5 nodes removed    PT Frequency 2x / week   PT Duration 4 weeks   PT Treatment/Interventions ADLs/Self Care Home Management;Patient/family education;Taping;Passive range of motion;Scar mobilization;Manual lymph drainage;Manual techniques;Therapeutic activities;Therapeutic exercise;DME Instruction   PT Next Visit Plan Teach scar massage and myofascial release to lateral scar cord.  Assess effect of foam pad discharge or renew    Consulted and Agree with Plan of Care Patient      Patient will benefit from skilled therapeutic intervention in order to improve the following deficits and impairments:  Decreased skin integrity, Increased edema, Decreased scar mobility, Decreased knowledge of precautions, Decreased activity tolerance, Decreased knowledge of use of DME, Decreased strength, Increased fascial restricitons, Impaired UE functional use, Pain, Increased muscle spasms, Impaired perceived functional ability, Postural dysfunction  Visit Diagnosis: Aftercare following surgery for neoplasm  Stiffness of right shoulder, not elsewhere classified  Acute  pain of right shoulder  Muscle weakness (generalized)     Problem List Patient Active Problem List   Diagnosis Date Noted  . Cellulitis of right breast 10/25/2016  . Recurrent cancer of right breast ([place]) 10/17/2016  . Genetic testing 09/28/2016  . Family history of breast cancer   . Malignant neoplasm of overlapping sites of right breast  in female, estrogen receptor positive ([place]) 09/16/2016  . 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 Major depression, recurrent ([place]) 09/13/2016  . Acute bronchitis 06/15/2015  . Palpitations 01/27/2015  . Recurrent UTI (urinary tract infection) 01/27/2015  . Congestion of both ears 09/10/2014  . Basal cell carcinoma of skin 08/19/2014  . Right hip pain 08/19/2014  . ANXIETY 03/27/2008  . Low back pain 08/27/2007   Donato Heinz. Owens Shark PT  [place] Levo 12/27/2016, 11:58 AM  [place], [place], [address] Phone: [phone]   Fax:  [phone]  Name: Lisa Salinas MRN: [id number] Date of Birth: 05/13/1955

## 2017-01-04 ENCOUNTER — Ambulatory Visit: Payer: BC Managed Care – PPO | Admitting: Physical Therapy

## 2017-01-04 ENCOUNTER — Encounter: Payer: Self-pay | Admitting: Physical Therapy

## 2017-01-04 DIAGNOSIS — Z483 Aftercare following surgery for neoplasm: Secondary | ICD-10-CM

## 2017-01-04 DIAGNOSIS — M25611 Stiffness of right shoulder, not elsewhere classified: Secondary | ICD-10-CM

## 2017-01-04 NOTE — Therapy (Signed)
[place], [place], [address] Phone: [phone]   Fax:  [phone]  Physical Therapy Treatment  Patient Details  Name: Lisa Salinas MRN: [id number] Date of Birth: 06-02-1955 Referring Provider: Dr. Dalbert Batman   Encounter Date: 01/04/2017      PT End of Session - 01/04/17 1659    Visit Number 10   Number of Visits 9   Date for PT Re-Evaluation 01/05/17   PT Start Time 1303   PT Stop Time 1352   PT Time Calculation (min) 49 min   Activity Tolerance Patient tolerated treatment well   Behavior During Therapy [facility] for tasks assessed/performed      Past Medical History:  Diagnosis Date  . Anxiety state, unspecified   . Basal cell carcinoma (BCC) of face   . Breast cancer, right breast ([place]) 10/20/2004   S/P lumpectomy (04/2005) and chemo (03/2005)  . Depression   . Dizziness and giddiness   . Dysrhythmia   . Family history of breast cancer   . Personal history of chemotherapy 03/2005  . Personal history of radiation therapy 05/2005    Past Surgical History:  Procedure Laterality Date  . ABDOMINAL HYSTERECTOMY     "left my ovaries; no cancer"  . BASAL CELL CARCINOMA EXCISION Right    side of face   . BREAST BIOPSY Bilateral    "2 on the left; 1 on the right; all benign"  . BREAST BIOPSY Right 10/20/2004   malignant  . BREAST EXCISIONAL BIOPSY Left   . BREAST LUMPECTOMY Right 04/2005  . COLONOSCOPY    . MASTECTOMY COMPLETE / SIMPLE Right 10/17/2016  . MASTECTOMY W/ SENTINEL NODE BIOPSY Right 10/17/2016   Procedure: RIGHT MASTECTOMY WITH SENTINEL LYMPH NODE BIOPSY;  Surgeon: Fanny Skates, MD;  Location: [facility];  Service: General;  Laterality: Right;  . TONSILLECTOMY  Childhood    There were no vitals filed for this visit.      Subjective Assessment - 01/04/17 1305    Subjective I don't think that spot is as tight as it was the other day. The foam pad did okay. I don't think it made a  big difference. It kept sliding out.    Pertinent History right breast cancer recurrance ( radiation with first occurance july 2006)  with right mastectomy with 5 nodes removed on October 17, 2016 followed by skin reaction likely due to a type of skin prep. Will not have to have chemo and not a candidate for radiation since she has had it before.  will not have reconstruction.    Patient Stated Goals to get back to normal , "to be able to put a band in my hair"  12/20/2016  pt has met her goal of being able to tie her hair back into ponytail   she still has numbness and a little bit of tightness   Currently in Pain? No/denies                         [facility] Adult PT Treatment/Exercise - 01/04/17 0001      Manual Therapy   Manual therapy comments when pt sat up following treatment session there was a small amount of blood on the sheet, pt states she had skin cancer recently removed from this area- covered this area with a Telfa using paper tape and encouraged pt to replace with band aid when she gets home   Myofascial Release instructed pt in  self myofascial release to band of tightness in right chest, also instructed pt in scar mobilization- performed myofascial release along band of tightness in right chest using unidirectional release and cross friction, also performed scar mobilization to this area with pt reporting improvement at end of session                        [facility] Goals - 01/04/17 1658      CC Long Term Goal  #1   Title Pt will have understanding of lymphedema risk reduction precautions.    Baseline achieved. Pt still may come to the ABC class    Time 4   Period Weeks   Status Achieved     CC Long Term Goal  #2   Title pt will be indepedent in home exercise program for range of motion and strength    Time 4   Period Weeks   Status Achieved     CC Long Term Goal  #3   Title Pt will have right shoulder abduciton to 140 degrees so that she  can easily  put a hair tie in her hair.    Baseline 85 at eval; 150 on 12/12/16, 163 on 12/20/2016   Time 4   Period Weeks   Status Achieved     CC Long Term Goal  #4   Title Pt reports she knows how to stretch to reduce cording in anterior chest and to do scar massage in this area    Baseline 01/04/17- pt demonstrates independence with this today   Time 1   Period Weeks   Status Achieved            Plan - 01/04/17 1701    Clinical Impression Statement Pt has now met all goals for therapy. She was educated in self mobilization of band of tightness in right chest using myofascial release and scar massage. Pt reports she is able to continue this at home. She has been doing stretches given to her at last session to decrease tightness. Pt is ready for discharge at this time.    Rehab Potential Good   Clinical Impairments Affecting Rehab Potential previously radiated skin , 5 nodes removed    PT Frequency 2x / week   PT Duration 4 weeks   PT Treatment/Interventions ADLs/Self Care Home Management;Patient/family education;Taping;Passive range of motion;Scar mobilization;Manual lymph drainage;Manual techniques;Therapeutic activities;Therapeutic exercise;DME Instruction   PT Next Visit Plan d/c this vist   Consulted and Agree with Plan of Care Patient      Patient will benefit from skilled therapeutic intervention in order to improve the following deficits and impairments:  Decreased skin integrity, Increased edema, Decreased scar mobility, Decreased knowledge of precautions, Decreased activity tolerance, Decreased knowledge of use of DME, Decreased strength, Increased fascial restricitons, Impaired UE functional use, Pain, Increased muscle spasms, Impaired perceived functional ability, Postural dysfunction  Visit Diagnosis: Aftercare following surgery for neoplasm  Stiffness of right shoulder, not elsewhere classified     Problem List Patient Active Problem List   Diagnosis Date Noted   . Cellulitis of right breast 10/25/2016  . Recurrent cancer of right breast ([place]) 10/17/2016  . Genetic testing 09/28/2016  . Family history of breast cancer   . Malignant neoplasm of overlapping sites of right breast in female, estrogen receptor positive ([place]) 09/16/2016  . 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 Major depression, recurrent ([place]) 09/13/2016  . Acute bronchitis 06/15/2015  .  Palpitations 01/27/2015  . Recurrent UTI (urinary tract infection) 01/27/2015  . Congestion of both ears 09/10/2014  . Basal cell carcinoma of skin 08/19/2014  . Right hip pain 08/19/2014  . ANXIETY 03/27/2008  . Low back pain 08/27/2007    Allyson Sabal [facility] 01/04/2017, 5:03 PM  [place], [place], [address] Phone: [phone]   Fax:  [phone]  Name: Lisa Salinas MRN: [id number] Date of Birth: 09-28-1955  PHYSICAL THERAPY DISCHARGE SUMMARY  Visits from Start of Care: 10 Current functional level related to goals / functional outcomes: See above   Remaining deficits: Still has band of tightness but is able to self manage   Education / Equipment: HEP  Plan: Patient agrees to discharge.  Patient goals were met. Patient is being discharged due to meeting the stated rehab goals.  ?????     Allyson Sabal [place], [place] 01/04/17 5:03 PM

## 2017-01-06 ENCOUNTER — Telehealth: Payer: Self-pay

## 2017-01-06 ENCOUNTER — Other Ambulatory Visit: Payer: Self-pay

## 2017-01-06 NOTE — Telephone Encounter (Signed)
Lisa Salinas with [employer] called about pt's long term disability claim. The attending physician statement had original disability date of 09/26/16, and a corrected date of 10/17/16. She would like clarification of this. Her phone number is [phone].  Please reference claim #[id number]

## 2017-01-12 ENCOUNTER — Telehealth: Payer: Self-pay | Admitting: *Deleted

## 2017-01-12 NOTE — Telephone Encounter (Signed)
"  Aldona Bar Merrrit with [employer] calling for Lisa Salinas in reference to an attending physician statement we received from Dr. Jana Hakim.  Please call [phone] using claim ID number: [id number]."

## 2017-01-28 ENCOUNTER — Other Ambulatory Visit: Payer: Self-pay | Admitting: Family Medicine

## 2017-01-30 ENCOUNTER — Telehealth: Payer: Self-pay

## 2017-01-30 NOTE — Telephone Encounter (Signed)
Fax received from [employer] with signed request from pt for release of medical records.  In basket sent to [employer].  Forms have been placed in pick up box at nurses desk.

## 2017-02-02 ENCOUNTER — Encounter: Payer: Self-pay | Admitting: Family Medicine

## 2017-02-02 ENCOUNTER — Ambulatory Visit (INDEPENDENT_AMBULATORY_CARE_PROVIDER_SITE_OTHER): Payer: BC Managed Care – PPO | Admitting: Family Medicine

## 2017-02-02 ENCOUNTER — Telehealth: Payer: Self-pay

## 2017-02-02 VITALS — BP 108/70 | HR 69 | Temp 98.2°F | Ht 65.5 in | Wt 155.0 lb

## 2017-02-02 DIAGNOSIS — C50911 Malignant neoplasm of unspecified site of right female breast: Secondary | ICD-10-CM

## 2017-02-02 DIAGNOSIS — Z23 Encounter for immunization: Secondary | ICD-10-CM | POA: Diagnosis not present

## 2017-02-02 DIAGNOSIS — F331 Major depressive disorder, recurrent, moderate: Secondary | ICD-10-CM

## 2017-02-02 MED ORDER — SERTRALINE HCL 100 MG PO TABS: 100.0000 mg | ORAL_TABLET | Freq: Every day | ORAL | 3 refills | Status: DC

## 2017-02-02 MED ORDER — TRAZODONE HCL 50 MG PO TABS: 25.0000 mg | ORAL_TABLET | Freq: Every evening | ORAL | 3 refills | Status: DC | PRN

## 2017-02-02 NOTE — Progress Notes (Signed)
Subjective:    Patient ID: Lisa Salinas, female    DOB: [birth year], 61 y.o.   MRN: [id number]  HPI    61 year old female with recent diagnosis and treatment of recurrent breast cancer returns for re-eval of mood.  Recurrent, depression:  Currently on sertraline 50 mg daily. She feels more anxious than depressed.  She is really jumpy, cannot relax, she  Does not want to be in crowds or drive.  Constant during the day. She is unable to focus on tasks.  poro appetite.  Difficulty sleeping.  Trouble getting thoughts together, memory issues   Depression screen [facility] 2/9 02/02/2017  Decreased Interest 2  Down, Depressed, Hopeless 1  PHQ - 2 Score 3  Altered sleeping 3  Tired, decreased energy 2  Change in appetite 2  Feeling bad or failure about yourself  1  Trouble concentrating 3  Moving slowly or fidgety/restless 1  Suicidal thoughts 0  PHQ-9 Score 15  Difficult doing work/chores Very difficult   GAD 7 : Generalized Anxiety Score 02/02/2017  Nervous, Anxious, on Edge 3  Control/stop worrying 2  Worry too much - different things 2  Trouble relaxing 3  Restless 0  Easily annoyed or irritable 1  Afraid - awful might happen 2  Total GAD 7 Score 13  Anxiety Difficulty Very difficult     She has been out of work  since 09/26/2016  Scheduled to go back to work 02/16/2017.  She deals with administration, contract, salaries, preparation for meeting.  She feels she cannot doe these things with current concentration and memory issues.        Review of Systems  Constitutional: Negative for fatigue and fever.  HENT: Negative for ear pain.   Eyes: Negative for pain.  Respiratory: Negative for chest tightness and shortness of breath.   Cardiovascular: Negative for chest pain, palpitations and leg swelling.  Gastrointestinal: Negative for abdominal pain.  Genitourinary: Negative for dysuria.       Objective:   Physical Exam  Constitutional: Vital signs are normal.  She appears well-developed and well-nourished. She is cooperative.  Non-toxic appearance. She does not appear ill. No distress.  HENT:  Head: Normocephalic.  Right Ear: Hearing, tympanic membrane, external ear and ear canal normal. Tympanic membrane is not erythematous, not retracted and not bulging.  Left Ear: Hearing, tympanic membrane, external ear and ear canal normal. Tympanic membrane is not erythematous, not retracted and not bulging.  Nose: No mucosal edema or rhinorrhea. Right sinus exhibits no maxillary sinus tenderness and no frontal sinus tenderness. Left sinus exhibits no maxillary sinus tenderness and no frontal sinus tenderness.  Mouth/Throat: Uvula is midline, oropharynx is clear and moist and mucous membranes are normal.  Eyes: Pupils are equal, round, and reactive to light. Conjunctivae, EOM and lids are normal. Lids are everted and swept, no foreign bodies found.  Neck: Trachea normal and normal range of motion. Neck supple. Carotid bruit is not present. No thyroid mass and no thyromegaly present.  Cardiovascular: Normal rate, regular rhythm, S1 normal, S2 normal, normal heart sounds, intact distal pulses and normal pulses.  Exam reveals no gallop and no friction rub.   No murmur heard. Pulmonary/Chest: Effort normal and breath sounds normal. No tachypnea. No respiratory distress. She has no decreased breath sounds. She has no wheezes. She has no rhonchi. She has no rales.  Abdominal: Soft. Normal appearance and bowel sounds are normal. There is no tenderness.  Neurological: She is alert.  Skin:  Skin is warm, dry and intact. No rash noted.  Psychiatric: Her speech is normal and behavior is normal. Judgment and thought content normal. Her mood appears anxious. Cognition and memory are impaired. She does not exhibit a depressed mood. She expresses no homicidal and no suicidal ideation. She expresses no suicidal plans and no homicidal plans.          Assessment & Plan:

## 2017-02-02 NOTE — Patient Instructions (Addendum)
Increase sertraline to 100 mg daily.  Work on stress reduction and relaxation techniques.  Call if interested in counselor.  Bring in [employer] paperwork.. We will extend leave until next appt in 4 weeks.

## 2017-02-07 LAB — HM COLONOSCOPY

## 2017-02-09 ENCOUNTER — Telehealth: Payer: Self-pay | Admitting: Family Medicine

## 2017-02-09 NOTE — Telephone Encounter (Signed)
Pt dropped off FMLA to be updated and extended.   Please return to [place] when complete. Forms in Dr. Diona Browner INbox.  Call pt at [phone] when finished and ready to pick up. Pt will turn into her employer

## 2017-02-09 NOTE — Assessment & Plan Note (Signed)
Increase sertraline to 100 mg daily.  Work on stress reduction and relaxation techniques.  Call if interested in counselor.

## 2017-02-10 ENCOUNTER — Encounter: Payer: Self-pay | Admitting: Family Medicine

## 2017-02-10 DIAGNOSIS — Z0279 Encounter for issue of other medical certificate: Secondary | ICD-10-CM

## 2017-02-10 NOTE — Telephone Encounter (Signed)
Completed in outbox.

## 2017-02-14 ENCOUNTER — Telehealth: Payer: Self-pay | Admitting: Family Medicine

## 2017-02-14 ENCOUNTER — Other Ambulatory Visit: Payer: Self-pay | Admitting: Family Medicine

## 2017-02-14 DIAGNOSIS — F331 Major depressive disorder, recurrent, moderate: Secondary | ICD-10-CM

## 2017-02-14 NOTE — Telephone Encounter (Signed)
Per carrie paperwork faxed  Pt aware

## 2017-02-14 NOTE — Telephone Encounter (Signed)
See orders only note

## 2017-02-14 NOTE — Telephone Encounter (Signed)
Lisa Salinas  Did you get the paperwork  I dont' see it anywhere

## 2017-02-14 NOTE — Progress Notes (Signed)
Psychology referral for MDD.

## 2017-02-14 NOTE — Telephone Encounter (Signed)
Copied from [place] #[id number]. Topic: Appointment Scheduling - Scheduling Inquiry for Clinic >> Feb 14, 2017  8:12 AM Lisa Salinas wrote: Reason for CRM: Dr. Diona Browner wanted pt to come in and see a counselor and the pt would like to schedule that pt will be out of town the weekend of nov. 8th

## 2017-02-22 ENCOUNTER — Ambulatory Visit (INDEPENDENT_AMBULATORY_CARE_PROVIDER_SITE_OTHER): Payer: BC Managed Care – PPO | Admitting: Psychology

## 2017-02-22 ENCOUNTER — Telehealth: Payer: Self-pay | Admitting: Family Medicine

## 2017-02-22 DIAGNOSIS — F411 Generalized anxiety disorder: Secondary | ICD-10-CM | POA: Diagnosis not present

## 2017-02-22 NOTE — Telephone Encounter (Signed)
Patient's leave is through tomorrow.  Patient saw Caroline Sauger today and would like to know if the leave can be extended to the end of the month or end of the year.  Patient spoke to her benefits and they said they just need a note.  Patient said she can come by to pick up note.

## 2017-02-24 NOTE — Telephone Encounter (Signed)
Okay to write note to extend leave until end onf November, but Pt will need follow up appt at end of month  if does not already have schedule for reassessment.

## 2017-02-24 NOTE — Telephone Encounter (Signed)
Lisa Salinas notified letter is ready to be picked up at the front desk.

## 2017-03-15 ENCOUNTER — Ambulatory Visit (INDEPENDENT_AMBULATORY_CARE_PROVIDER_SITE_OTHER): Payer: BC Managed Care – PPO | Admitting: Psychology

## 2017-03-15 DIAGNOSIS — F411 Generalized anxiety disorder: Secondary | ICD-10-CM

## 2017-03-15 DIAGNOSIS — K5790 Diverticulosis of intestine, part unspecified, without perforation or abscess without bleeding: Secondary | ICD-10-CM | POA: Insufficient documentation

## 2017-03-15 DIAGNOSIS — K648 Other hemorrhoids: Secondary | ICD-10-CM | POA: Insufficient documentation

## 2017-03-17 ENCOUNTER — Telehealth: Payer: Self-pay | Admitting: Family Medicine

## 2017-03-17 NOTE — Telephone Encounter (Signed)
Copied from [place]. >> Mar 17, 2017  2:17 PM Neva Seat wrote: [employer] on behalf of [employer] Records request for pt.  Asking if the office has received the fax request.   Please call them back at [phone]

## 2017-03-20 NOTE — Telephone Encounter (Signed)
Did not received datafied will refax

## 2017-03-20 NOTE — Telephone Encounter (Signed)
Received paperwork in Dr Rometta Emery in box  Records release sent to [employer] to have copied @ mailed to [facility]

## 2017-03-21 ENCOUNTER — Ambulatory Visit (INDEPENDENT_AMBULATORY_CARE_PROVIDER_SITE_OTHER): Payer: BC Managed Care – PPO | Admitting: Family Medicine

## 2017-03-21 ENCOUNTER — Encounter: Payer: Self-pay | Admitting: Emergency Medicine

## 2017-03-21 ENCOUNTER — Encounter: Payer: Self-pay | Admitting: Family Medicine

## 2017-03-21 VITALS — BP 112/68 | HR 83 | Temp 98.0°F | Ht 65.5 in | Wt 151.0 lb

## 2017-03-21 DIAGNOSIS — F411 Generalized anxiety disorder: Secondary | ICD-10-CM

## 2017-03-21 DIAGNOSIS — F331 Major depressive disorder, recurrent, moderate: Secondary | ICD-10-CM

## 2017-03-21 MED ORDER — SERTRALINE HCL 100 MG PO TABS: 100.0000 mg | ORAL_TABLET | Freq: Every day | ORAL | 1 refills | Status: DC

## 2017-03-21 NOTE — Assessment & Plan Note (Signed)
Improved with  sertraline 100 mg daily.. minimal tolerable SE.  Continue for now.  Continue counseling.  pt would benefit from continued leave until 04/17/2017 as she continues to have difficulty handling job activities due to fatigue and decreased concentration.

## 2017-03-21 NOTE — Progress Notes (Signed)
a 

## 2017-03-21 NOTE — Progress Notes (Signed)
Subjective:    Patient ID: Lisa Salinas, female    DOB: 09-15-1955, 61 y.o.   MRN: [id number]  HPI    61 year old female  Presents for follow up of  anxiety and deression.    At last OV 02/02/2017  Treated for recurrent depression.. sertraline was increased to 100 mg daily.   Today she reports:  She has had improvement in mood on higher dose of sertraline.Marland Kitchen Has helped some.  She does have SE of slight shakiness. Improves as day goes on.  She is sleeping better at night. Using occ 1/2 tab of trazodone. Seeing counselor, Charisse Klinefelter once every 2 weeks.. She has seen her 2 times.   She has not returned to work yet.  PHQ9 has improved.  She is unable to return to work yet.. She continues with stress.  She has been trying to decrease stress as able. She is staying   Busy to help distract her from stress. She plans to retire in January. She feels that stress will decrease when this happens.  Depression screen [facility] 2/9 03/21/2017 02/02/2017  Decreased Interest 1 2  Down, Depressed, Hopeless 0 1  PHQ - 2 Score 1 3  Altered sleeping 2 3  Tired, decreased energy 2 2  Change in appetite 2 2  Feeling bad or failure about yourself  1 1  Trouble concentrating 2 3  Moving slowly or fidgety/restless 1 1  Suicidal thoughts 0 0  PHQ-9 Score 11 15  Difficult doing work/chores Somewhat difficult Very difficult     Review of Systems  Constitutional: Negative for fatigue and fever.  HENT: Negative for ear pain.   Eyes: Negative for pain.  Respiratory: Negative for chest tightness and shortness of breath.   Cardiovascular: Negative for chest pain, palpitations and leg swelling.  Gastrointestinal: Negative for abdominal pain.  Genitourinary: Negative for dysuria.       Objective:   Physical Exam  Constitutional: Vital signs are normal. She appears well-developed and well-nourished. She is cooperative.  Non-toxic appearance. She does not appear ill. No distress.  HENT:  Head:  Normocephalic.  Right Ear: Hearing, tympanic membrane, external ear and ear canal normal. Tympanic membrane is not erythematous, not retracted and not bulging.  Left Ear: Hearing, tympanic membrane, external ear and ear canal normal. Tympanic membrane is not erythematous, not retracted and not bulging.  Nose: No mucosal edema or rhinorrhea. Right sinus exhibits no maxillary sinus tenderness and no frontal sinus tenderness. Left sinus exhibits no maxillary sinus tenderness and no frontal sinus tenderness.  Mouth/Throat: Uvula is midline, oropharynx is clear and moist and mucous membranes are normal.  Eyes: Conjunctivae, EOM and lids are normal. Pupils are equal, round, and reactive to light. Lids are everted and swept, no foreign bodies found.  Neck: Trachea normal and normal range of motion. Neck supple. Carotid bruit is not present. No thyroid mass and no thyromegaly present.  Cardiovascular: Normal rate, regular rhythm, S1 normal, S2 normal, normal heart sounds, intact distal pulses and normal pulses. Exam reveals no gallop and no friction rub.  No murmur heard. Pulmonary/Chest: Effort normal and breath sounds normal. No tachypnea. No respiratory distress. She has no decreased breath sounds. She has no wheezes. She has no rhonchi. She has no rales.  Abdominal: Soft. Normal appearance and bowel sounds are normal. There is no tenderness.  Neurological: She is alert.  Skin: Skin is warm, dry and intact. No rash noted.  Psychiatric: Her speech is normal and behavior  is normal. Judgment and thought content normal. Her mood appears not anxious. Her affect is blunt. Cognition and memory are normal. She exhibits a depressed mood.          Assessment & Plan:

## 2017-03-21 NOTE — Patient Instructions (Addendum)
Continue current dose of medication 

## 2017-04-03 ENCOUNTER — Ambulatory Visit (INDEPENDENT_AMBULATORY_CARE_PROVIDER_SITE_OTHER): Payer: BC Managed Care – PPO | Admitting: Psychology

## 2017-04-03 DIAGNOSIS — F411 Generalized anxiety disorder: Secondary | ICD-10-CM | POA: Diagnosis not present

## 2017-04-05 ENCOUNTER — Telehealth: Payer: Self-pay | Admitting: Oncology

## 2017-04-05 NOTE — Telephone Encounter (Signed)
04/05/17 Called patient @ [phone] @ 4:37 pm to inform them that their Disability paperwork was faxed to [employer] @ [phone]

## 2017-04-12 ENCOUNTER — Ambulatory Visit (INDEPENDENT_AMBULATORY_CARE_PROVIDER_SITE_OTHER): Payer: BC Managed Care – PPO | Admitting: Psychology

## 2017-04-12 DIAGNOSIS — F411 Generalized anxiety disorder: Secondary | ICD-10-CM | POA: Diagnosis not present

## 2017-04-25 ENCOUNTER — Ambulatory Visit: Payer: BC Managed Care – PPO | Admitting: Psychology

## 2017-05-02 ENCOUNTER — Encounter: Payer: Self-pay | Admitting: Family Medicine

## 2017-05-02 ENCOUNTER — Other Ambulatory Visit: Payer: Self-pay

## 2017-05-02 ENCOUNTER — Ambulatory Visit: Payer: BC Managed Care – PPO | Admitting: Family Medicine

## 2017-05-02 ENCOUNTER — Encounter: Payer: Self-pay | Admitting: *Deleted

## 2017-05-02 DIAGNOSIS — L659 Nonscarring hair loss, unspecified: Secondary | ICD-10-CM

## 2017-05-02 LAB — CBC WITH DIFFERENTIAL/PLATELET
BASOS ABS: 0 10*3/uL (ref 0.0–0.1)
Basophils Relative: 0.2 % (ref 0.0–3.0)
EOS ABS: 0.1 10*3/uL (ref 0.0–0.7)
Eosinophils Relative: 2.7 % (ref 0.0–5.0)
HEMATOCRIT: 41.6 % (ref 36.0–46.0)
HEMOGLOBIN: 13.7 g/dL (ref 12.0–15.0)
LYMPHS PCT: 40.4 % (ref 12.0–46.0)
Lymphs Abs: 2.1 10*3/uL (ref 0.7–4.0)
MCHC: 33 g/dL (ref 30.0–36.0)
MCV: 92.8 fl (ref 78.0–100.0)
MONOS PCT: 7.9 % (ref 3.0–12.0)
Monocytes Absolute: 0.4 10*3/uL (ref 0.1–1.0)
NEUTROS ABS: 2.6 10*3/uL (ref 1.4–7.7)
Neutrophils Relative %: 48.8 % (ref 43.0–77.0)
PLATELETS: 165 10*3/uL (ref 150.0–400.0)
RBC: 4.48 Mil/uL (ref 3.87–5.11)
RDW: 13.2 % (ref 11.5–15.5)
WBC: 5.2 10*3/uL (ref 4.0–10.5)

## 2017-05-02 LAB — T3, FREE: T3 FREE: 3.3 pg/mL (ref 2.3–4.2)

## 2017-05-02 LAB — IBC PANEL
IRON: 73 ug/dL (ref 42–145)
SATURATION RATIOS: 19.8 % — AB (ref 20.0–50.0)
Transferrin: 263 mg/dL (ref 212.0–360.0)

## 2017-05-02 LAB — FERRITIN: Ferritin: 92.1 ng/mL (ref 10.0–291.0)

## 2017-05-02 LAB — TSH: TSH: 0.65 u[IU]/mL (ref 0.35–4.50)

## 2017-05-02 LAB — T4, FREE: Free T4: 0.85 ng/dL (ref 0.60–1.60)

## 2017-05-02 NOTE — Progress Notes (Signed)
   Subjective:    Patient ID: Lisa Salinas, female    DOB: [birth year], 62 y.o.   MRN: [id number]  HPI   62 year old female patient present with alopecia   She has been under major stress in the last year with breast cancer recurrence, chem, surgery etc. As well as depression, anxiety.  She has noted thinning of her hair in last 3-4 weeks. Diffuse.  Has noted more hair in the shower drain, has noted hairs on clothes, dishes etc.  No receding hair line.   She has changed shampoo now. Has not had hair dyed.  now using baby shampoo.  She has started taking iron and B12.  She has tried to increase yogurt in her diet. Washing hair 2 times a week.  She is now in menopause and is on tamoxifen.  She has now stopped the trazodone.   Wt Readings from Last 3 Encounters:  05/02/17 152 lb 8 oz (69.2 kg)  03/21/17 151 lb (68.5 kg)  02/02/17 155 lb (70.3 kg)        Review of Systems  Constitutional: Negative for fatigue and fever.  HENT: Negative for congestion.   Eyes: Negative for pain.  Respiratory: Negative for cough and shortness of breath.   Cardiovascular: Negative for chest pain, palpitations and leg swelling.  Gastrointestinal: Negative for abdominal pain.  Genitourinary: Negative for dysuria and vaginal bleeding.  Musculoskeletal: Negative for back pain.  Neurological: Negative for syncope, light-headedness and headaches.  Psychiatric/Behavioral: Negative for dysphoric mood.       Objective:   Physical Exam  Constitutional: Vital signs are normal. She appears well-developed and well-nourished. She is cooperative.  Non-toxic appearance. She does not appear ill. No distress.  HENT:  Head: Normocephalic.  Right Ear: Hearing, tympanic membrane, external ear and ear canal normal. Tympanic membrane is not erythematous, not retracted and not bulging.  Left Ear: Hearing, tympanic membrane, external ear and ear canal normal. Tympanic membrane is not erythematous, not retracted  and not bulging.  Nose: No mucosal edema or rhinorrhea. Right sinus exhibits no maxillary sinus tenderness and no frontal sinus tenderness. Left sinus exhibits no maxillary sinus tenderness and no frontal sinus tenderness.  Mouth/Throat: Uvula is midline, oropharynx is clear and moist and mucous membranes are normal.  Eyes: Conjunctivae, EOM and lids are normal. Pupils are equal, round, and reactive to light. Lids are everted and swept, no foreign bodies found.  Neck: Trachea normal and normal range of motion. Neck supple. Carotid bruit is not present. No thyroid mass and no thyromegaly present.  Cardiovascular: Normal rate, regular rhythm, S1 normal, S2 normal, normal heart sounds, intact distal pulses and normal pulses. Exam reveals no gallop and no friction rub.  No murmur heard. Pulmonary/Chest: Effort normal and breath sounds normal. No tachypnea. No respiratory distress. She has no decreased breath sounds. She has no wheezes. She has no rhonchi. She has no rales.  Abdominal: Soft. Normal appearance and bowel sounds are normal. There is no tenderness.  Neurological: She is alert.  Skin: Skin is warm, dry and intact. No rash noted.  Diffuse thinning.. No rash on scalp, hair not easily removed  Psychiatric: Her speech is normal and behavior is normal. Judgment and thought content normal. Her mood appears not anxious. Cognition and memory are normal. She does not exhibit a depressed mood.          Assessment & Plan:

## 2017-05-02 NOTE — Assessment & Plan Note (Signed)
Multifactorial.  Most likely stress related loss. May be secondary to all 3 of her meds. Can limit trazodone use.  Eval with labs.  Can use women's rogaine if interested.

## 2017-05-02 NOTE — Patient Instructions (Addendum)
Please stop at the lab to have labs drawn.  Can try OTC Rogaine for women if interested.  Can use trazodone as needed.  Discuss tamoxifen with oncologist.

## 2017-05-11 ENCOUNTER — Ambulatory Visit: Payer: Self-pay | Admitting: Family Medicine

## 2017-05-16 ENCOUNTER — Telehealth: Payer: Self-pay | Admitting: Family Medicine

## 2017-05-16 NOTE — Telephone Encounter (Signed)
Copied from [place]. Topic: Inquiry >> May 16, 2017  2:09 PM Conception Chancy, NT wrote: Pt daughter Caryl Pina is calling and stating that pt has not received her disability since 01/10/17. They are requesting more documentation including copies of all OV notes, lab work and surgery's since 09/14/16. She states she would like a phone call from the office to see if its something they can do or if she needs to take this up with disability because all of these have been sent. Please advise and contact Caryl Pina (daughter of pt)

## 2017-05-16 NOTE — Telephone Encounter (Signed)
Spoke with Caryl Pina Pt daughter  She stated [employer] is saying they never received records.  I spoke with [facility] @ [employer] [phone].   She stated those records were downloaded to [employer] portal on 04/03/17.    [place] of this she will call [employer] back  If they need additional information she will have them refax medical records release

## 2017-06-29 ENCOUNTER — Encounter: Payer: Self-pay | Admitting: Family Medicine

## 2017-06-29 ENCOUNTER — Ambulatory Visit: Payer: BC Managed Care – PPO | Admitting: Family Medicine

## 2017-06-29 VITALS — BP 114/80 | HR 77 | Temp 98.3°F | Wt 156.5 lb

## 2017-06-29 DIAGNOSIS — M545 Low back pain: Secondary | ICD-10-CM

## 2017-06-29 LAB — POC URINALSYSI DIPSTICK (AUTOMATED)
BILIRUBIN UA: NEGATIVE
Blood, UA: NEGATIVE
GLUCOSE UA: NEGATIVE
Ketones, UA: NEGATIVE
Nitrite, UA: NEGATIVE
Protein, UA: NEGATIVE
Spec Grav, UA: 1.015 (ref 1.010–1.025)
Urobilinogen, UA: 0.2 E.U./dL
pH, UA: 6 (ref 5.0–8.0)

## 2017-06-29 MED ORDER — CEPHALEXIN 250 MG PO CAPS: 250.0000 mg | ORAL_CAPSULE | Freq: Two times a day (BID) | ORAL | 0 refills | Status: DC

## 2017-06-29 NOTE — Progress Notes (Signed)
She had hair loss and she stopped trazodone and the tamoxifen to see if that made a difference.  She has been off tamoxifen for about 1.5 months.  She is going to restart.  D/w pt.  She can f/u with PCP as needed about this.    She had a rash with an unrecalled topical agent.  I asked her to check her old notes and tell us to update her chart.    Dysuria: no duration of symptoms: ~5 days abdominal pain: no Fevers: no back pain: yes, this was similar to prev UTI sx, lower back pain bilaterally  Vomiting: no vomiting but some nausea Taking aleve for pain with relief of back pain.   Meds, vitals, and allergies reviewed.   Per HPI unless specifically indicated in ROS section   GEN: nad, alert and oriented HEENT: mucous membranes moist NECK: supple CV: rrr.  PULM: ctab, no inc wob ABD: soft, +bs, suprapubic area not tender EXT: no edema BACK: no CVA pain, not ttp.

## 2017-06-29 NOTE — Patient Instructions (Signed)
Drink plenty of water and hold the antibiotics.  Start keflex if you have more urinary symptoms.  Take aleve with food in the meantime. We'll contact you with your lab report.  Take care.

## 2017-06-30 NOTE — Assessment & Plan Note (Signed)
This could be musculoskeletal but she has had similar symptoms with UTI previously.  Check urine culture.  Hold prescription for Keflex.  Routine cautions given to patient.  Start Keflex if she has dysuria.  See AVS.    She can follow-up with PCP regarding hair loss and tamoxifen/trazodone as needed.  I will defer.

## 2017-07-01 LAB — URINE CULTURE
MICRO NUMBER: 90325586
SPECIMEN QUALITY: ADEQUATE

## 2017-07-02 ENCOUNTER — Other Ambulatory Visit: Payer: Self-pay | Admitting: Family Medicine

## 2017-07-02 MED ORDER — NITROFURANTOIN MONOHYD MACRO 100 MG PO CAPS: 100.0000 mg | ORAL_CAPSULE | Freq: Two times a day (BID) | ORAL | 0 refills | Status: DC

## 2017-07-03 ENCOUNTER — Other Ambulatory Visit: Payer: Self-pay | Admitting: *Deleted

## 2017-07-03 MED ORDER — NITROFURANTOIN MONOHYD MACRO 100 MG PO CAPS: 100.0000 mg | ORAL_CAPSULE | Freq: Two times a day (BID) | ORAL | 0 refills | Status: DC

## 2017-10-03 NOTE — Progress Notes (Signed)
No show

## 2017-10-04 ENCOUNTER — Inpatient Hospital Stay: Payer: BC Managed Care – PPO | Attending: Oncology | Admitting: Oncology

## 2017-10-04 ENCOUNTER — Encounter: Payer: Self-pay | Admitting: Oncology

## 2017-10-04 ENCOUNTER — Inpatient Hospital Stay: Payer: BC Managed Care – PPO

## 2017-10-12 ENCOUNTER — Other Ambulatory Visit: Payer: Self-pay | Admitting: Family Medicine

## 2017-10-12 NOTE — Telephone Encounter (Signed)
See Drug warning between Sertraline and Tamoxifen.  Ok to refill?

## 2017-10-17 ENCOUNTER — Other Ambulatory Visit: Payer: Self-pay | Admitting: Oncology

## 2017-10-26 ENCOUNTER — Other Ambulatory Visit: Payer: Self-pay

## 2017-10-26 MED ORDER — TAMOXIFEN CITRATE 20 MG PO TABS: 20.0000 mg | ORAL_TABLET | Freq: Every day | ORAL | 0 refills | Status: DC

## 2017-10-31 ENCOUNTER — Other Ambulatory Visit (HOSPITAL_COMMUNITY)
Admission: RE | Admit: 2017-10-31 | Discharge: 2017-10-31 | Disposition: A | Discharge status: Home / Self Care | Payer: BC Managed Care – PPO | Source: Ambulatory Visit | Attending: Family Medicine | Admitting: Family Medicine

## 2017-10-31 ENCOUNTER — Encounter: Payer: Self-pay | Admitting: Family Medicine

## 2017-10-31 ENCOUNTER — Ambulatory Visit (INDEPENDENT_AMBULATORY_CARE_PROVIDER_SITE_OTHER): Payer: BC Managed Care – PPO | Admitting: Family Medicine

## 2017-10-31 ENCOUNTER — Telehealth: Payer: Self-pay | Admitting: Family Medicine

## 2017-10-31 VITALS — BP 110/60 | HR 79 | Temp 97.9°F | Ht 65.0 in | Wt 155.0 lb

## 2017-10-31 DIAGNOSIS — F331 Major depressive disorder, recurrent, moderate: Secondary | ICD-10-CM

## 2017-10-31 DIAGNOSIS — Z1151 Encounter for screening for human papillomavirus (HPV): Secondary | ICD-10-CM | POA: Insufficient documentation

## 2017-10-31 DIAGNOSIS — C50911 Malignant neoplasm of unspecified site of right female breast: Secondary | ICD-10-CM | POA: Diagnosis not present

## 2017-10-31 DIAGNOSIS — Z90710 Acquired absence of both cervix and uterus: Secondary | ICD-10-CM | POA: Insufficient documentation

## 2017-10-31 DIAGNOSIS — Z170 Estrogen receptor positive status [ER+]: Secondary | ICD-10-CM

## 2017-10-31 DIAGNOSIS — Z01419 Encounter for gynecological examination (general) (routine) without abnormal findings: Secondary | ICD-10-CM | POA: Diagnosis present

## 2017-10-31 DIAGNOSIS — Z0000 Encounter for general adult medical examination without abnormal findings: Secondary | ICD-10-CM | POA: Diagnosis not present

## 2017-10-31 DIAGNOSIS — C50811 Malignant neoplasm of overlapping sites of right female breast: Secondary | ICD-10-CM

## 2017-10-31 DIAGNOSIS — M810 Age-related osteoporosis without current pathological fracture: Secondary | ICD-10-CM

## 2017-10-31 DIAGNOSIS — Z13220 Encounter for screening for lipoid disorders: Secondary | ICD-10-CM

## 2017-10-31 NOTE — Progress Notes (Signed)
Subjective:    Patient ID: Lisa Salinas, female    DOB: 08-17-1955, 62 y.o.   MRN: [id number]  HPI  The patient is here for annual wellness exam and preventative care.    Has noted rash in vaginal area in last 2 days.. Redness, itching, no discharge. Has been at pool a lot lately, sweating a lot.  Recurrent breast cancer  Followed by Dr. Griffith Citron  MDD, recurrent: on sertraline 100 mg daily, trazodone for sleep prn.   exercise: 3 times walking week  Diet: healthy   Has been followed by Dr. Earlean Shawl for internal hemorrhoids, treated with band ligation  Social History /Family History/Past Medical History reviewed in detail and updated in EMR if needed. Blood pressure 110/60, pulse 79, temperature 97.9 F (36.6 C), temperature source Oral, height 5\' 5"  (1.651 m), weight 155 lb (70.3 kg), SpO2 97 %. Body mass index is 25.79 kg/m.  Review of Systems  Constitutional: Negative for fatigue and fever.  HENT: Negative for congestion.   Eyes: Negative for pain.  Respiratory: Negative for cough and shortness of breath.   Cardiovascular: Negative for chest pain, palpitations and leg swelling.  Gastrointestinal: Negative for abdominal pain.  Genitourinary: Negative for dysuria, urgency, vaginal bleeding, vaginal discharge and vaginal pain.  Musculoskeletal: Negative for back pain.  Skin: Positive for rash.  Neurological: Negative for syncope, light-headedness and headaches.  Psychiatric/Behavioral: Negative for dysphoric mood.       Objective:   Physical Exam  Constitutional: Vital signs are normal. She appears well-developed and well-nourished. She is cooperative.  Non-toxic appearance. She does not appear ill. No distress.  HENT:  Head: Normocephalic.  Right Ear: Hearing, tympanic membrane, external ear and ear canal normal.  Left Ear: Hearing, tympanic membrane, external ear and ear canal normal.  Nose: Nose normal.  Eyes: Pupils are equal, round, and reactive to light.  Conjunctivae, EOM and lids are normal. Lids are everted and swept, no foreign bodies found.  Neck: Trachea normal and normal range of motion. Neck supple. Carotid bruit is not present. No thyroid mass and no thyromegaly present.  Cardiovascular: Normal rate, regular rhythm, S1 normal, S2 normal, normal heart sounds and intact distal pulses. Exam reveals no gallop.  No murmur heard. Pulmonary/Chest: Effort normal and breath sounds normal. No respiratory distress. She has no wheezes. She has no rhonchi. She has no rales. No breast tenderness, discharge or bleeding.  Abdominal: Soft. Normal appearance and bowel sounds are normal. She exhibits no distension, no fluid wave, no abdominal bruit and no mass. There is no hepatosplenomegaly. There is no tenderness. There is no rebound, no guarding and no CVA tenderness. No hernia.  Genitourinary: Vagina normal and uterus normal. No breast tenderness, discharge or bleeding. Pelvic exam was performed with patient supine. There is rash on the right labia. There is no tenderness or lesion on the right labia. There is rash on the left labia. There is no tenderness or lesion on the left labia. Uterus is not enlarged and not tender. Cervix exhibits no motion tenderness, no discharge and no friability. Right adnexum displays no mass, no tenderness and no fullness. Left adnexum displays no mass, no tenderness and no fullness.  Genitourinary Comments: Irritation.. Not clearly yeast at labia bilaterally. NO CERVIX VISUALIZED!  Lymphadenopathy:    She has no cervical adenopathy.    She has no axillary adenopathy.  Neurological: She is alert. She has normal strength. No cranial nerve deficit or sensory deficit.  Skin: Skin is warm, dry  and intact. No rash noted.  Psychiatric: Her speech is normal and behavior is normal. Judgment normal. Her mood appears not anxious. Cognition and memory are normal. She does not exhibit a depressed mood.          Assessment & Plan:    The patient's preventative maintenance and recommended screening tests for an annual wellness exam were reviewed in full today. Brought up to date unless services declined.  Counselled on the importance of diet, exercise, and its role in overall health and mortality. The patient's FH and SH was reviewed, including their home life, tobacco status, and drug and alcohol status.   2012 neg pap, no HPV.. Had partial hysterectomy.. Pt thinks she has cervix.... On exam no cervix but pt request vaginal cuff swab and HPV. Mammogram: per onc Colon:  01/2017 nml , repeat in 10 years. Vaccines: uptodate, can consider shingles HIV: refused DEXA: 2014 Dr. Ubaldo Glassing osteoporosis.On tamoxifen for 2  year course ( has 1 more year)

## 2017-10-31 NOTE — Assessment & Plan Note (Signed)
Well controlled. Continue current medication.  

## 2017-10-31 NOTE — Telephone Encounter (Signed)
-----   Message from Lendon Collar, RT sent at 10/31/2017 10:04 AM EDT ----- Regarding: Lab orders for tomorrow Wed 11/01/17 Please enter CPE lab orders for tomorrow 11/01/17. Thanks-Lauren

## 2017-10-31 NOTE — Assessment & Plan Note (Signed)
Followed by onc. In active treatment on tamoxifem.

## 2017-10-31 NOTE — Patient Instructions (Addendum)
Please stop at the front desk to set up referral.  Return for fasting labs when able for chol and CMET.  Can use topical  desitin to protect skin.. If not improving in 2-3 days.. Use OTC  Yeast treatment

## 2017-11-01 ENCOUNTER — Other Ambulatory Visit: Payer: Self-pay | Admitting: Family Medicine

## 2017-11-01 ENCOUNTER — Other Ambulatory Visit (INDEPENDENT_AMBULATORY_CARE_PROVIDER_SITE_OTHER): Payer: BC Managed Care – PPO

## 2017-11-01 DIAGNOSIS — Z13220 Encounter for screening for lipoid disorders: Secondary | ICD-10-CM | POA: Diagnosis not present

## 2017-11-01 LAB — COMPREHENSIVE METABOLIC PANEL
ALK PHOS: 31 U/L — AB (ref 39–117)
ALT: 12 U/L (ref 0–35)
AST: 16 U/L (ref 0–37)
Albumin: 4.3 g/dL (ref 3.5–5.2)
BILIRUBIN TOTAL: 0.7 mg/dL (ref 0.2–1.2)
BUN: 16 mg/dL (ref 6–23)
CALCIUM: 9.4 mg/dL (ref 8.4–10.5)
CO2: 31 meq/L (ref 19–32)
Chloride: 103 mEq/L (ref 96–112)
Creatinine, Ser: 0.81 mg/dL (ref 0.40–1.20)
GFR: 75.95 mL/min (ref >60.00)
Glucose, Bld: 91 mg/dL (ref 70–99)
POTASSIUM: 4.2 meq/L (ref 3.5–5.1)
Sodium: 140 mEq/L (ref 135–145)
TOTAL PROTEIN: 7.1 g/dL (ref 6.0–8.3)

## 2017-11-01 LAB — LIPID PANEL
CHOL/HDL RATIO: 3
Cholesterol: 142 mg/dL (ref 0–200)
HDL: 54.4 mg/dL (ref >39.00)
LDL Cholesterol: 74 mg/dL (ref 0–99)
NonHDL: 88.01
TRIGLYCERIDES: 71 mg/dL (ref 0.0–149.0)
VLDL: 14.2 mg/dL (ref 0.0–40.0)

## 2017-11-01 LAB — CYTOLOGY - PAP
DIAGNOSIS: NEGATIVE
HPV: NOT DETECTED

## 2017-11-01 MED ORDER — FLUCONAZOLE 150 MG PO TABS: 150.0000 mg | ORAL_TABLET | Freq: Once | ORAL | 0 refills | Status: AC

## 2017-11-01 NOTE — Progress Notes (Signed)
Results given to patient as instructed.

## 2017-11-01 NOTE — Progress Notes (Signed)
See result note.  

## 2017-11-02 ENCOUNTER — Telehealth: Payer: Self-pay | Admitting: Family Medicine

## 2017-11-02 NOTE — Telephone Encounter (Signed)
Dr Diona Browner I called breast center to see if pt needs ultra sound with dx mammogram.  They stated if pt was not having any new problems she only needed screening   IMG [id number]  If she is having new problems they need orders  For left ultra sound [id number]  Or if new problem  on right ultra sound  IMG [id number]

## 2017-11-03 ENCOUNTER — Telehealth: Payer: Self-pay | Admitting: Oncology

## 2017-11-03 NOTE — Telephone Encounter (Signed)
Order changed.

## 2017-11-03 NOTE — Telephone Encounter (Signed)
Pt left vmail to r/s missed appt in June - called pt back and no answer - left vmail for patient to call back to reschedule appt.

## 2017-11-03 NOTE — Addendum Note (Signed)
Addended byEliezer Lofts E on: 11/03/2017 01:01 PM   Modules accepted: Orders

## 2017-12-21 ENCOUNTER — Ambulatory Visit
Admission: RE | Admit: 2017-12-21 | Discharge: 2017-12-21 | Disposition: A | Discharge status: Home / Self Care | Payer: BC Managed Care – PPO | Source: Ambulatory Visit | Attending: Family Medicine | Admitting: Family Medicine

## 2017-12-21 DIAGNOSIS — M810 Age-related osteoporosis without current pathological fracture: Secondary | ICD-10-CM

## 2017-12-21 DIAGNOSIS — C50911 Malignant neoplasm of unspecified site of right female breast: Secondary | ICD-10-CM

## 2017-12-24 ENCOUNTER — Encounter: Payer: Self-pay | Admitting: Family Medicine

## 2017-12-24 DIAGNOSIS — M810 Age-related osteoporosis without current pathological fracture: Secondary | ICD-10-CM | POA: Insufficient documentation

## 2017-12-25 NOTE — Progress Notes (Signed)
[place]  [phone] [phone]     ID: KINDRED HEYING DOB: 08-Jan-1956  MR#: [id number]  XVQ#:[id number]  Patient Care Team: Jinny Sanders, MD as PCP - General Grahm Etsitty, Virgie Dad, MD as Consulting Physician (Oncology) Irene Limbo, MD as Consulting Physician (Plastic Surgery) Fanny Skates, MD as Consulting Physician (General Surgery) OTHER MD:  CHIEF COMPLAINT: Estrogen receptor positive ductal carcinoma in situ  CURRENT TREATMENT: Tamoxifen   BREAST CANCER HISTORY: From the recent summary note:  I saw [facility] remotely for an invasive right-sided breast cancer initially diagnosed in 2006. We have requested the records for review, but from the patient's recollection: She was treated neoadjuvantly with tamoxifen, but on repeat imaging and there had been no significant response. She underwent right lumpectomy and sentinel lymph node sampling early in 2007. She tells me the lymph nodes were negative. She then received chemotherapy followed by radiation. She then took Aromasin for 5 years, and was discharged from follow-up here in 2012.  More recently, on 08/18/2016, she underwent bilateral screening mammography with tomography at the breast Center. This found the breast density to be category C. In the right breast there were calcifications warranting further evaluation and on 08/23/2016 she underwent a unilateral right diagnostic mammogram which found pleomorphic calcifications in the upper outer quadrant of the right breast spanning 0.5 cm. There was a second area of punctate calcifications adjacent to the biopsy site slightly more posterior.  On 08/25/2016 she underwent a biopsy of the main area of concern in the right breast, and this found ductal carcinoma in situ, grade 2, estrogen receptor 100% positive, progesterone receptor 10% positive, both with strong staining.  Her subsequent history is as detailed below   INTERVAL HISTORY: Lisa Salinas  returns today for follow-up of her noninvasive breast cancer and remote invasive breast cancer. She continues on tamoxifen, with good tolerance. She denies issues with hot flashes. She has increased vaginal discharge with recently having a yeast infection. She received a vaginal anti-fungal under her doctor. She notes that she occasionally feels symptoms of a yeast infection and sometimes this is hard to manage.  Since her last visit, she underwent routine screening unilateral left breast mammography with CAD and tomography on 12/21/2017 at [facility] showing: breast density category C. There was no evidence of malignancy.  She also completed a bone density on 12/21/2017 which showed a T score of -3.1 osteoporosis.    REVIEW OF SYSTEMS: Nattie reports that she is retired as of January 2018. She has been spending time with her mother who is 63. She has also been taking care of her grandchildren during the day. She went to the beach this summer. For exercies, she walks  3 days per week for 30-45 minutes. She denies unusual headaches, visual changes, nausea, vomiting, or dizziness. There has been no unusual cough, phlegm production, or pleurisy. There has been no change in bowel or bladder habits. She denies unexplained fatigue or unexplained weight loss, bleeding, rash, or fever. A detailed review of systems was otherwise stable.    PAST MEDICAL HISTORY: Past Medical History:  Diagnosis Date  . Anxiety state, unspecified   . Basal cell carcinoma (BCC) of face   . Breast cancer, right breast ([place]) 10/20/2004   S/P lumpectomy (04/2005) and chemo (03/2005)  . Depression   . Dizziness and giddiness   . Dysrhythmia   . Family history of breast cancer   . Personal history of chemotherapy 03/2005  . Personal history  of radiation therapy 05/2005    PAST SURGICAL HISTORY: Past Surgical History:  Procedure Laterality Date  . ABDOMINAL HYSTERECTOMY     "left my ovaries; no cancer"  . BASAL  CELL CARCINOMA EXCISION Right    side of face   . BREAST BIOPSY Bilateral    "2 on the left; 1 on the right; all benign"  . BREAST BIOPSY Right 10/20/2004   malignant  . BREAST EXCISIONAL BIOPSY Left   . BREAST LUMPECTOMY Right 04/2005  . COLONOSCOPY    . MASTECTOMY COMPLETE / SIMPLE Right 10/17/2016  . MASTECTOMY W/ SENTINEL NODE BIOPSY Right 10/17/2016   Procedure: RIGHT MASTECTOMY WITH SENTINEL LYMPH NODE BIOPSY;  Surgeon: Fanny Skates, MD;  Location: [facility];  Service: General;  Laterality: Right;  . TONSILLECTOMY  Childhood    FAMILY HISTORY Family History  Problem Relation Age of Onset  . Heart disease Mother   . Hypertension Mother   . Hyperlipidemia Mother   . Cancer Father 29       lung, smoker  . Breast cancer Maternal Aunt 29  . Breast cancer Paternal Aunt 27  . Breast cancer Maternal Aunt 66  . Breast cancer Cousin 52       paternal first cousin  The patient's father died at age 57 from lung cancer in the setting of tobacco abuse. The patient's mother is living at age 63. The patient has one sister, no brothers. The patient has multiple relatives with breast cancer on both sides of the family and was tested for the BRCA1 and 2 genes remotely and was found to be negative.  GYNECOLOGIC HISTORY:  No LMP recorded. Patient has had a hysterectomy. Menarche age 62, first live birth age 62, the patient is [place] P2. She underwent hysterectomy without salpingo-oophorectomy at age 62. She did not have hormone replacement.  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 Her [name] is a retired Airline pilot. He is now a used Teacher, early years/pre. Daughter [name] works in the nutrition department of the [employer] school system. Son Pine Ridge lives in [place] and is in Press photographer for [employer]. The patient has 3 granddaughters. She is a Psychologist, forensic.    ADVANCED DIRECTIVES:    HEALTH  MAINTENANCE: Social History   Tobacco Use  . Smoking status: Never Smoker  . Smokeless tobacco: Never Used  Substance Use Topics  . Alcohol use: No  . Drug use: No     Colonoscopy:Due 2018  PAP:  Bone density: 12/21/2017 showed a T-score of -3.1 osteoporosis   Allergies  Allergen Reactions  . Sulfonamide Derivatives Rash    REACTION: [place]    Current Outpatient Medications  Medication Sig Dispense Refill  . calcium citrate-vitamin D (CITRACAL+D) 315-200 MG-UNIT per tablet Take 1 tablet by mouth 2 (two) times daily.      . sertraline (ZOLOFT) 100 MG tablet TAKE 1 TABLET BY MOUTH DAILY 90 tablet 0  . tamoxifen (NOLVADEX) 20 MG tablet Take 1 tablet (20 mg total) by mouth daily. 90 tablet 0  . traZODone (DESYREL) 50 MG tablet Take 0.5-1 tablets (25-50 mg total) by mouth at bedtime as needed for sleep. 30 tablet 3   No current facility-administered medications for this visit.     OBJECTIVE: Middle-aged white woman in no acute distress  Vitals:   12/26/17 1115  BP: 109/68  Pulse: 71  Resp: 18  Temp: 98.4 F (36.9 C)  SpO2:  98%     Body mass index is 26.49 kg/m.    ECOG FS:0 - Asymptomatic   Sclerae unicteric, EOMs intact Oropharynx clear and moist No cervical or supraclavicular adenopathy Lungs no rales or rhonchi Heart regular rate and rhythm Abd soft, nontender, positive bowel sounds MSK no focal spinal tenderness, no upper extremity lymphedema Neuro: nonfocal, well oriented, appropriate affect Breasts: The right breast is status post mastectomy.  There is no evidence of chest wall recurrence.  The left breast is benign.  Both axillae are benign.   LAB RESULTS:  CMP     Component Value Date/Time   NA 140 11/01/2017 0745   K 4.2 11/01/2017 0745   CL 103 11/01/2017 0745   CO2 31 11/01/2017 0745   GLUCOSE 91 11/01/2017 0745   BUN 16 11/01/2017 0745   CREATININE 0.81 11/01/2017 0745   CALCIUM 9.4 11/01/2017 0745   PROT 7.1 11/01/2017 0745   ALBUMIN 4.3  11/01/2017 0745   AST 16 11/01/2017 0745   ALT 12 11/01/2017 0745   ALKPHOS 31 (L) 11/01/2017 0745   BILITOT 0.7 11/01/2017 0745   GFRNONAA >60 10/17/2016 1828   GFRAA >60 10/17/2016 1828    No results found for: TOTALPROTELP, ALBUMINELP, A1GS, A2GS, BETS, BETA2SER, GAMS, MSPIKE, SPEI  No results found for: Nils Pyle, [facility]  Lab Results  Component Value Date   WBC 4.8 12/26/2017   NEUTROABS 2.1 12/26/2017   HGB 13.2 12/26/2017   HCT 40.5 12/26/2017   MCV 93.5 12/26/2017   PLT 158 12/26/2017      Chemistry      Component Value Date/Time   NA 140 11/01/2017 0745   K 4.2 11/01/2017 0745   CL 103 11/01/2017 0745   CO2 31 11/01/2017 0745   BUN 16 11/01/2017 0745   CREATININE 0.81 11/01/2017 0745      Component Value Date/Time   CALCIUM 9.4 11/01/2017 0745   ALKPHOS 31 (L) 11/01/2017 0745   AST 16 11/01/2017 0745   ALT 12 11/01/2017 0745   BILITOT 0.7 11/01/2017 0745       Lab Results  Component Value Date   LABCA2 22 08/31/2010    No components found for: [id number]  No results for input(s): INR in the last 168 hours.  Urinalysis    Component Value Date/Time   COLORURINE lt. yellow 05/20/2010 1551   APPEARANCEUR Hazy 05/20/2010 1551   LABSPEC <1.005 05/20/2010 1551   PHURINE 6.0 05/20/2010 1551   HGBUR trace-lysed 05/20/2010 1551   BILIRUBINUR neg 06/29/2017 1454   KETONESUR negative 02/05/2015 0851   PROTEINUR Neg 06/29/2017 1454   UROBILINOGEN 0.2 06/29/2017 1454   UROBILINOGEN 0.2 05/20/2010 1551   NITRITE Neg 06/29/2017 1454   NITRITE negative 05/20/2010 1551   LEUKOCYTESUR Small (1+) (A) 06/29/2017 1454     STUDIES: Dg Bone Density  Result Date: 12/21/2017 EXAM: DUAL X-RAY ABSORPTIOMETRY (DXA) FOR BONE MINERAL DENSITY IMPRESSION: Referring Physician:  Eliezer Lofts Your patient completed a BMD test using Lunar IDXA DXA system ( analysis version: 16 ) manufactured by [employer]. PATIENT: Name: Lisa Salinas, Lisa Salinas Patient ID:  [id number] Birth Date: [birth year] Height: 65.2 in. Sex: Female Measured: 12/21/2017 Weight: 158.1 lbs. Indications: Breast Cancer History, Caucasian, Estrogen Deficient, History of Osteoporosis, Hysterectomy, Postmenopausal, Zoloft Fractures: None Treatments: Calcium (E943.0), Vitamin D (E933.5) ASSESSMENT: The BMD measured at Femur Neck Left is 0.603 g/cm2 with a T-score of -3.1. This patient is considered osteoporotic according to [facility]) criteria. The scan quality is good.  L- 3, L-4 were excluded due to degenerative changes. Spine was not compared to prior study due to exclusion of vertebral bodies on current exam. DXA exam performed on prior Hologic device measured only unilateral hip (Total Mean was not measured) Therefore current or prior Total Mean cannot be compared. Site Region Measured Date Measured Age YA BMD Significant CHANGE T-score AP Spine  L1-L2      11/21/2017    62.9         -2.9    0.812 g/cm2 DualFemur Neck Left 12/21/2017 62.9 -3.1 0.603 g/cm2 * DualFemur Neck Left  06/06/2012    57.4         -2.4    0.705 g/cm2 DualFemur Total Mean 12/21/2017    62.9         -2.6    0.674 g/cm2 World Health Organization [facility]) criteria for post-menopausal, Caucasian Women: Normal       T-score at or above -1 SD Osteopenia   T-score between -1 and -2.5 SD Osteoporosis T-score at or below -2.5 SD RECOMMENDATION: 1. All patients should optimize calcium and vitamin D intake. 2. Consider FDA approved medical therapies in postmenopausal women and men aged 36 years and older, based on the following: a. A hip or vertebral (clinical or morphometric) fracture b. T- score < or = -2.5 at the femoral neck or spine after appropriate evaluation to exclude secondary causes c. Low bone mass (T-score between -1.0 and -2.5 at the femoral neck or spine) and a 10 year probability of a hip fracture > or = 3% or a 10 year probability of a major osteoporosis-related fracture > or = 20% based on the [place]-adapted  WHO algorithm d. Clinician judgment and/or patient preferences may indicate treatment for people with 10-year fracture probabilities above or below these levels FOLLOW-UP: People with diagnosed cases of osteoporosis or at high risk for fracture should have regular bone mineral density tests. For patients eligible for Medicare, routine testing is allowed once every 2 years. The testing frequency can be increased to one year for patients who have rapidly progressing disease, those who are receiving or discontinuing medical therapy to restore bone mass, or have additional risk factors. I have reviewed this report and agree with the above findings. [facility] Radiology Electronically Signed   By: Earle Gell M.D.   On: 12/21/2017 10:03   Mm 3d Screen Breast Uni Left  Result Date: 12/21/2017 CLINICAL DATA:  Screening. EXAM: DIGITAL SCREENING UNILATERAL LEFT MAMMOGRAM WITH CAD AND TOMO COMPARISON:  Previous exam(s). ACR Breast Density Category c: The breast tissue is heterogeneously dense, which may obscure small masses. FINDINGS: The patient has had a right mastectomy. There are no findings suspicious for malignancy. Images were processed with CAD. IMPRESSION: No mammographic evidence of malignancy. A result letter of this screening mammogram will be mailed directly to the patient. RECOMMENDATION: Screening mammogram in one year.  (Code:[id number]-L-24M) BI-RADS CATEGORY  1: Negative. Electronically Signed   By: Ammie Ferrier M.D.   On: 12/21/2017 14:02     ELIGIBLE FOR AVAILABLE RESEARCH PROTOCOL: No  ASSESSMENT: 63 y.o. BRCA negative [place] woman  (1) status post right lumpectomy and sentinel lymph node sampling in January 2007 for a ypT1 ypN0 breast cancer  (a) status post neoadjuvant tamoxifen and Herceptin treatment with no significant response  (b) intermediate Oncotype score predicted a 10 year risk of recurrence outside the breast of 14% if she took tamoxifen for 5 years.  (c) status  post adjuvant chemotherapy  with weekly paclitaxel 8 doses  (d) status post adjuvant radiation  (e) completed 1 year of trastuzumab  (f) status post adjuvant exemestane for 5 years, completed May 2012   (2) status post right breast upper outer quadrant biopsy 08/25/2016 for ductal carcinoma in situ, grade 2, estrogen and progesterone receptor positive  (3) right mastectomy 10/17/2016 confirmed ductal carcinoma in situ, grade 1, measuring 2.8 cm, with negative margins and all 5 sentinel lymph nodes clear.  (4) tamoxifen started 09/15/2016    (5) further genetics testing 09/27/2016 through the Common Hereditary cancer panel.offered by [facility] i found no deleterious mutations in APC, ATM, AXIN2, BARD1, BMPR1A, BRCA1, BRCA2, BRIP1, CDH1, CDKN2A (p14ARF), CDKN2A (p16INK4a), CHEK2, CTNNA1, DICER1, EPCAM (Deletion/duplication testing only), GREM1 (promoter region deletion/duplication testing only), KIT, MEN1, MLH1, MSH2, MSH3, MSH6, MUTYH, NBN, NF1, NHTL1, PALB2, PDGFRA, PMS2, POLD1, POLE, PTEN, RAD50, RAD51C, RAD51D, SDHB, SDHC, SDHD, SMAD4, SMARCA4. STK11, TP53, TSC1, TSC2, and VHL.  The following genes were evaluated for sequence changes only: SDHA and HOXB13 c.251G>A variant only.   (6) the patient has decided against reconstruction  (7) osteoporosis : bone density on 12/21/2017 showed a T-score of -3.1 at the left femur neck  (a) ibandronate/Boniva started 12/26/2017     PLAN: Jolin is now 12-1/2 years out from tentative surgery for her invasive breast cancer, and a year out from her definitive surgery for her noninvasive breast cancer.  She continues on tamoxifen, with excellent tolerance and the plan is to continue that at least one more year  She is osteoporotic.  I spent approximately 30 minutes face to face with Kenzleigh with more than 50% of that time spent in counseling and coordination of care regarding this new problem.  We discussed bisphosphonates, oral versus IV, and we discussed  denosumab/Prolia  She has a good understanding of the possible benefits as well as the possible toxicity side effects and complications of these agents including the rare possibility of osteonecrosis of the jaw.  She decided she would like to give ibandronate/Boniva a try.  I have gone ahead and given her instructions on how to take the medication and placed the prescription in.  She will let us know if she has any side effects from it  She understands she does need to continue to take vitamin D and calcium and to improve on her walking program.  Of course tamoxifen also helps with osteoporosis issues.  Otherwise she will see me again in 1 year.  She knows to call for any other issues that may develop before the next visit.     Makyi Ledo, Virgie Dad, MD  12/26/17 11:47 AM [facility] [address] [place], [place] [address] Tel. [phone]    Fax. [phone]  Alice Rieger, am acting as scribe for Chauncey Cruel MD.  I, Lurline Del MD, have reviewed the above documentation for accuracy and completeness, and I agree with the above.

## 2017-12-26 ENCOUNTER — Inpatient Hospital Stay: Payer: BC Managed Care – PPO | Attending: Oncology

## 2017-12-26 ENCOUNTER — Inpatient Hospital Stay (HOSPITAL_BASED_OUTPATIENT_CLINIC_OR_DEPARTMENT_OTHER): Payer: BC Managed Care – PPO | Admitting: Oncology

## 2017-12-26 VITALS — BP 109/68 | HR 71 | Temp 98.4°F | Resp 18 | Ht 65.0 in | Wt 159.2 lb

## 2017-12-26 DIAGNOSIS — M810 Age-related osteoporosis without current pathological fracture: Secondary | ICD-10-CM | POA: Diagnosis not present

## 2017-12-26 DIAGNOSIS — C50811 Malignant neoplasm of overlapping sites of right female breast: Secondary | ICD-10-CM

## 2017-12-26 DIAGNOSIS — Z170 Estrogen receptor positive status [ER+]: Secondary | ICD-10-CM | POA: Insufficient documentation

## 2017-12-26 DIAGNOSIS — D0511 Intraductal carcinoma in situ of right breast: Secondary | ICD-10-CM

## 2017-12-26 DIAGNOSIS — Z853 Personal history of malignant neoplasm of breast: Secondary | ICD-10-CM | POA: Insufficient documentation

## 2017-12-26 DIAGNOSIS — Z79810 Long term (current) use of selective estrogen receptor modulators (SERMs): Secondary | ICD-10-CM | POA: Diagnosis not present

## 2017-12-26 DIAGNOSIS — Z9011 Acquired absence of right breast and nipple: Secondary | ICD-10-CM | POA: Diagnosis not present

## 2017-12-26 DIAGNOSIS — Z923 Personal history of irradiation: Secondary | ICD-10-CM | POA: Insufficient documentation

## 2017-12-26 DIAGNOSIS — Z7983 Long term (current) use of bisphosphonates: Secondary | ICD-10-CM | POA: Diagnosis not present

## 2017-12-26 DIAGNOSIS — Z9221 Personal history of antineoplastic chemotherapy: Secondary | ICD-10-CM

## 2017-12-26 DIAGNOSIS — C50911 Malignant neoplasm of unspecified site of right female breast: Secondary | ICD-10-CM

## 2017-12-26 DIAGNOSIS — M818 Other osteoporosis without current pathological fracture: Secondary | ICD-10-CM

## 2017-12-26 LAB — COMPREHENSIVE METABOLIC PANEL
ALK PHOS: 40 U/L (ref 38–126)
ALT: 12 U/L (ref 0–44)
ANION GAP: 8 (ref 5–15)
AST: 16 U/L (ref 15–41)
Albumin: 3.9 g/dL (ref 3.5–5.0)
BILIRUBIN TOTAL: 0.5 mg/dL (ref 0.3–1.2)
BUN: 20 mg/dL (ref 8–23)
CHLORIDE: 105 mmol/L (ref 98–111)
CO2: 28 mmol/L (ref 22–32)
Calcium: 9.4 mg/dL (ref 8.9–10.3)
Creatinine, Ser: 0.78 mg/dL (ref 0.44–1.00)
GFR calc Af Amer: >60 mL/min (ref >60)
GFR calc non Af Amer: >60 mL/min (ref >60)
Glucose, Bld: 84 mg/dL (ref 70–99)
POTASSIUM: 4.1 mmol/L (ref 3.5–5.1)
Sodium: 141 mmol/L (ref 135–145)
Total Protein: 6.7 g/dL (ref 6.5–8.1)

## 2017-12-26 LAB — CBC WITH DIFFERENTIAL/PLATELET
BASOS PCT: 0 %
Basophils Absolute: 0 10*3/uL (ref 0.0–0.1)
Eosinophils Absolute: 0.1 10*3/uL (ref 0.0–0.5)
Eosinophils Relative: 2 %
HEMATOCRIT: 40.5 % (ref 34.8–46.6)
HEMOGLOBIN: 13.2 g/dL (ref 11.6–15.9)
LYMPHS ABS: 2.2 10*3/uL (ref 0.9–3.3)
LYMPHS PCT: 47 %
MCH: 30.5 pg (ref 25.1–34.0)
MCHC: 32.6 g/dL (ref 31.5–36.0)
MCV: 93.5 fL (ref 79.5–101.0)
MONO ABS: 0.3 10*3/uL (ref 0.1–0.9)
MONOS PCT: 7 %
NEUTROS ABS: 2.1 10*3/uL (ref 1.5–6.5)
NEUTROS PCT: 44 %
Platelets: 158 10*3/uL (ref 145–400)
RBC: 4.33 MIL/uL (ref 3.70–5.45)
RDW: 12.8 % (ref 11.2–14.5)
WBC: 4.8 10*3/uL (ref 3.9–10.3)

## 2017-12-26 MED ORDER — IBANDRONATE SODIUM 150 MG PO TABS: 150.0000 mg | ORAL_TABLET | ORAL | 4 refills | Status: DC

## 2018-01-24 ENCOUNTER — Other Ambulatory Visit: Payer: Self-pay | Admitting: Oncology

## 2018-04-02 ENCOUNTER — Encounter: Payer: Self-pay | Admitting: Family Medicine

## 2018-04-02 ENCOUNTER — Ambulatory Visit: Payer: BC Managed Care – PPO | Admitting: Family Medicine

## 2018-04-02 VITALS — BP 126/72 | HR 91 | Temp 98.4°F | Resp 10 | Ht 65.0 in | Wt 167.0 lb

## 2018-04-02 DIAGNOSIS — Z23 Encounter for immunization: Secondary | ICD-10-CM | POA: Diagnosis not present

## 2018-04-02 DIAGNOSIS — J00 Acute nasopharyngitis [common cold]: Secondary | ICD-10-CM

## 2018-04-02 MED ORDER — AMOXICILLIN-POT CLAVULANATE 875-125 MG PO TABS: 1.0000 | ORAL_TABLET | Freq: Two times a day (BID) | ORAL | 0 refills | Status: AC

## 2018-04-02 NOTE — Addendum Note (Signed)
Addended by: Kris Mouton on: 04/02/2018 04:20 PM   Modules accepted: Orders

## 2018-04-02 NOTE — Progress Notes (Signed)
Subjective:     Lisa Salinas is a 62 y.o. female presenting for Nasal Congestion (x 3 weeks. At first symptosm were more in her chest but now more in her head. Headache, post nasal drainage, runny nose, irritated throat. Has taking Benadryl at night, Robitussin as needed.)     Sinus Problem  This is a new problem. The current episode started 1 to 4 weeks ago. The problem is unchanged. There has been no fever. Associated symptoms include congestion, coughing, headaches, a hoarse voice, shortness of breath, sneezing and a sore throat. Pertinent negatives include no ear pain, sinus pressure or swollen glands. (Post nasal drip) Past treatments include oral decongestants (benadryl, robitussin). The treatment provided mild relief.     Review of Systems  HENT: Positive for congestion, hoarse voice, sneezing and sore throat. Negative for ear pain and sinus pressure.   Respiratory: Positive for cough and shortness of breath.   Neurological: Positive for headaches.     Social History   Tobacco Use  Smoking Status Never Smoker  Smokeless Tobacco Never Used        Objective:    BP Readings from Last 3 Encounters:  04/02/18 126/72  12/26/17 109/68  10/31/17 110/60   Wt Readings from Last 3 Encounters:  04/02/18 167 lb (75.8 kg)  12/26/17 159 lb 3.2 oz (72.2 kg)  10/31/17 155 lb (70.3 kg)    BP 126/72   Pulse 91   Temp 98.4 F (36.9 C)   Resp 10   Ht 5\' 5"  (1.651 m)   Wt 167 lb (75.8 kg)   SpO2 97%   BMI 27.79 kg/m    Physical Exam Constitutional:      General: She is not in acute distress.    Appearance: She is well-developed. She is not diaphoretic.  HENT:     Head: Normocephalic and atraumatic.     Right Ear: Tympanic membrane and ear canal normal.     Left Ear: Tympanic membrane and ear canal normal.     Nose: Mucosal edema and rhinorrhea present.     Right Sinus: No maxillary sinus tenderness or frontal sinus tenderness.     Left Sinus: No maxillary sinus  tenderness or frontal sinus tenderness.     Mouth/Throat:     Pharynx: Uvula midline. Posterior oropharyngeal erythema present. No oropharyngeal exudate.     Tonsils: Swelling: 0 on the right. 0 on the left.  Eyes:     General: No scleral icterus.    Conjunctiva/sclera: Conjunctivae normal.  Neck:     Musculoskeletal: Neck supple.  Cardiovascular:     Rate and Rhythm: Normal rate and regular rhythm.     Heart sounds: Normal heart sounds. No murmur.  Pulmonary:     Effort: Pulmonary effort is normal. No respiratory distress.     Breath sounds: Normal breath sounds.  Lymphadenopathy:     Cervical: No cervical adenopathy.  Skin:    General: Skin is warm and dry.     Capillary Refill: Capillary refill takes less than 2 seconds.  Neurological:     Mental Status: She is alert.           Assessment & Plan:   Problem List Items Addressed This Visit    None    Visit Diagnoses    Acute nasopharyngitis    -  Primary   Relevant Medications   amoxicillin-clavulanate (AUGMENTIN) 875-125 MG tablet     Duration of symptoms concerning but otherwise no persistent HA,  facial pain/pressure, dental pain so suggest sinus infection.   Recommended symptomatic care - saline rinse, flonase and delayed Abx if not improving   Return if symptoms worsen or fail to improve.  Lesleigh Noe, MD

## 2018-04-02 NOTE — Patient Instructions (Signed)
Based on your symptoms, it looks like you have a virus.   Antibiotics are not need for a viral infection but the following will help:   1. Drink plenty of fluids 2. Get lots of rest  Sinus Congestion 1) Neti Pot (Saline rinse) -- 2 times day -- if tolerated 2) Flonase (Store Brand ok) - once daily 3) Over the counter congestion medications 4) Try Zyrtec (store brand OK)  Cough 1) Cough drops can be helpful 2) Nyquil (or nighttime cough medication) 3) Honey is proven to be one of the best cough medications   Sore Throat 1) Honey as above, cough drops 2) Ibuprofen or Aleve can be helpful 3) Salt water Gargles  If you develop fevers (Temperature >100.4), chills, worsening symptoms or symptoms lasting longer than 10 days return to clinic.    Start the antibiotic if not improving after a few days

## 2018-04-16 ENCOUNTER — Telehealth: Payer: Self-pay | Admitting: Family Medicine

## 2018-04-16 MED ORDER — FLUCONAZOLE 150 MG PO TABS: 150.0000 mg | ORAL_TABLET | Freq: Once | ORAL | 0 refills | Status: AC

## 2018-04-16 NOTE — Telephone Encounter (Signed)
Pt was seen on 12.16.19 and giving some medication that gave her a yeast infection. Pt is out of town and need to speak to nurse to see if something can be called in. Please call pt and she can provide information to nearest pharmacist.

## 2018-04-16 NOTE — Telephone Encounter (Signed)
Patient advised and expressed understanding.

## 2018-04-16 NOTE — Telephone Encounter (Signed)
Will send in prescription for Diflucan  If the symptoms do not resolved, recommend appointment with pcp when she returns.

## 2018-04-16 NOTE — Telephone Encounter (Signed)
Spoke with patient. Symptoms are itching, rash in the vaginal area-external and rectal area, a little bit of odor and abnormal discharge. Started on 04/11/18. She has used A&D ointment-did help itching some but not a lot, and she tried Vaginex (not sure of the correct name/spelling), [place] max strength and has sat in a hot/warm bath. Itching has been unbearable. She had yeast infection not to long ago and had same symptoms. She is out of town. She wanted to see if we can help and send something in for her symptoms. I updated the pharmacy in the chart to which medication needs to go to. [phone] .

## 2018-06-17 ENCOUNTER — Other Ambulatory Visit: Payer: Self-pay | Admitting: Family Medicine

## 2018-07-09 ENCOUNTER — Other Ambulatory Visit: Payer: Self-pay

## 2018-07-09 ENCOUNTER — Ambulatory Visit (INDEPENDENT_AMBULATORY_CARE_PROVIDER_SITE_OTHER): Payer: BC Managed Care – PPO | Admitting: Family Medicine

## 2018-07-09 ENCOUNTER — Encounter: Payer: Self-pay | Admitting: Family Medicine

## 2018-07-09 DIAGNOSIS — J3489 Other specified disorders of nose and nasal sinuses: Secondary | ICD-10-CM | POA: Diagnosis not present

## 2018-07-09 DIAGNOSIS — R300 Dysuria: Secondary | ICD-10-CM | POA: Diagnosis not present

## 2018-07-09 MED ORDER — LEVOFLOXACIN 500 MG PO TABS: 500.0000 mg | ORAL_TABLET | Freq: Every day | ORAL | 0 refills | Status: AC

## 2018-07-09 NOTE — Progress Notes (Signed)
     Egidio Lofgren T. Jordon Kristiansen, MD Primary Care and Sports Medicine [facility] at [facility] [place], [id number] [phone]  FAX: [phone]  Lisa Salinas - 63 y.o. female  MRN [id number]  Date of Birth: 1955-05-29  Visit Date: 07/09/2018  PCP: Jinny Sanders, MD  Referred by: Jinny Sanders, MD  Virtual Visit via Telephone Note:  I connected with  Lisa Salinas on 07/09/2018  9:20 AM EDT by telephone and verified that I am speaking with the correct person using two identifiers.   I discussed the limitations, risks, security and privacy concerns of performing an evaluation and management service by telephone and the availability of in person appointments. I also discussed with the patient that there may be a patient responsible charge related to this service. The patient expressed understanding and agreed to proceed.   Location of patient: home phone or cell phone Consent: Verbal consent directly obtained from Lisa Salinas. Additional people involved in the telemedicine call aside from the patient: none  I connected with her on 07/09/18 at  9:20 AM EDT by telephone and verified that I am speaking with the correct person using two identifiers. 9:32 PM  Ended coversation  Had some sinus issues, pressure, draining, has been taking some zyrtec, has been aching all over, Drinking more water, some winded and more dizzy.  Felt a little bit off, but better today.    I discussed the limitations, risks, security and privacy concerns of performing an evaluation and management service by telephone and the availability of in person appointments. I also discussed with the patient that there may be a patient responsible charge related to this service. The patient expressed understanding and agreed to proceed.      Past Medical History, Surgical History, Social History, Family History, Problem List, Medications, and Allergies have been reviewed and updated  if relevant.  Review of Systems as above: No acute distress verbally   Observations/Objective/Exam:  An attempt was made to discern vital signs over the phone, which yielded minimal information.  Pulmonary:     Effort: Pulmonary effort is normal. No respiratory distress.  Neurological:     Mental Status: He is alert and oriented to person, place, and time.  Psychiatric:        Thought Content: Thought content normal.        Judgment: Judgment normal.   Sinus pain  Dysuria  URI vs sinusitis + UTI sx Supportive care, LVQ to hold  I discussed the assessment and treatment plan with the patient. The patient was provided an opportunity to ask questions and all were answered. The patient agreed with the plan and demonstrated an understanding of the instructions.   The patient was advised to call back or seek an in-person evaluation if the symptoms worsen or if the condition fails to improve as anticipated.  I provided non-face-to-face evaluation and counseling during this encounter per below. Time, Beginning: 07/09/2018  9:20 AM EDT Time, Ending: 07/09/2018 9:32 AM  Follow-up: prn unless noted otherwise below No follow-ups on file.  Meds ordered this encounter  Medications  . levofloxacin (LEVAQUIN) 500 MG tablet    Sig: Take 1 tablet (500 mg total) by mouth daily for 10 days.    Dispense:  10 tablet    Refill:  0   No orders of the defined types were placed in this encounter.   Signed,  Maud Deed. Danecia Underdown, MD

## 2018-07-31 ENCOUNTER — Encounter: Payer: Self-pay | Admitting: Family Medicine

## 2018-07-31 ENCOUNTER — Ambulatory Visit (INDEPENDENT_AMBULATORY_CARE_PROVIDER_SITE_OTHER): Payer: BC Managed Care – PPO | Admitting: Family Medicine

## 2018-07-31 DIAGNOSIS — M545 Low back pain, unspecified: Secondary | ICD-10-CM

## 2018-07-31 MED ORDER — CYCLOBENZAPRINE HCL 10 MG PO TABS: 5.0000 mg | ORAL_TABLET | Freq: Every evening | ORAL | 0 refills | Status: DC | PRN

## 2018-07-31 MED ORDER — DICLOFENAC SODIUM 75 MG PO TBEC: 75.0000 mg | DELAYED_RELEASE_TABLET | Freq: Two times a day (BID) | ORAL | 0 refills | Status: DC

## 2018-07-31 NOTE — Progress Notes (Signed)
VIRTUAL VISIT Due to national recommendations of social distancing due to [place] 19, a virtual visit is felt to be most appropriate for this patient at this time.   Interactive audio and video telecommunications were attempted between this provider and patient, however failed, due to patient having technical difficulties OR patient did not have access to video capability.  We continued and completed visit with audio only.    I connected with the patient on 07/31/18 at 10:45 AM EDT by virtual telehealth platform and verified that I am speaking with the correct person using two identifiers.   I discussed the limitations, risks, security and privacy concerns of performing an evaluation and management service by  virtual telehealth platform and the availability of in person appointments. I also discussed with the patient that there may be a patient responsible charge related to this service. The patient expressed understanding and agreed to proceed.  Patient location: Home Provider Location: [facility] Participants: Lisa Salinas and Lisa Salinas   Chief Complaint  Patient presents with  . Back Pain    From one hip across back to other hip.  Did heavy duty cleaning yesterday.  Denies UTI symptoms    History of Present Illness: 63 year old female pt with history of  Recurrent breast cancer,  Osteoporosis and history of right hip pain presents with new onset pain in  Low back, bilaterally.   She reports new onset pain in low back following heavy cleaning, vaccuuming and loifting. Worst over right hip.  tried 2 aleve with no improvement.  Pain when trying to stand up straight or bending forward. Pain radiates to right buttock.. not down leg.  No dysuria, no frequency no fever, no incontinence.  No numbness or tingling in legs.  No fall proceeding, no injury.   7/10 on pain scale  COVID 19 screen No recent travel or known exposure to [place] The patient denies respiratory  symptoms of COVID 19 at this time.  The importance of social distancing was discussed today.   Review of Systems  Constitutional: Negative for chills and fever.  HENT: Negative for congestion and ear pain.   Eyes: Negative for pain and redness.  Respiratory: Negative for cough and shortness of breath.   Cardiovascular: Negative for chest pain, palpitations and leg swelling.  Gastrointestinal: Negative for abdominal pain, blood in stool, constipation, diarrhea, nausea and vomiting.  Genitourinary: Negative for dysuria.  Musculoskeletal: Negative for falls and myalgias.  Skin: Negative for rash.  Neurological: Negative for dizziness.  Psychiatric/Behavioral: Negative for depression. The patient is not nervous/anxious.       Past Medical History:  Diagnosis Date  . Anxiety state, unspecified   . Basal cell carcinoma (BCC) of face   . Breast cancer, right breast ([place]) 10/20/2004   S/P lumpectomy (04/2005) and chemo (03/2005)  . Depression   . Dizziness and giddiness   . Dysrhythmia   . Family history of breast cancer   . Personal history of chemotherapy 03/2005  . Personal history of radiation therapy 05/2005    reports that she has never smoked. She has never used smokeless tobacco. She reports that she does not drink alcohol or use drugs.   Current Outpatient Medications:  .  calcium citrate-vitamin D (CITRACAL+D) 315-200 MG-UNIT per tablet, Take 1 tablet by mouth 2 (two) times daily.  , Disp: , Rfl:  .  ibandronate (BONIVA) 150 MG tablet, Take 1 tablet (150 mg total) by mouth every 30 (thirty) days. Take in  the morning with a full glass of water, on an empty stomach, and do not take anything else by mouth or lie down for the next 30 min., Disp: 5 tablet, Rfl: 4 .  sertraline (ZOLOFT) 100 MG tablet, TAKE 1 TABLET BY MOUTH DAILY, Disp: 90 tablet, Rfl: 0 .  tamoxifen (NOLVADEX) 20 MG tablet, TAKE 1 TABLET BY MOUTH EVERY DAY, Disp: 90 tablet, Rfl: 0 .  traZODone (DESYREL) 50 MG tablet,  Take 0.5-1 tablets (25-50 mg total) by mouth at bedtime as needed for sleep., Disp: 30 tablet, Rfl: 3   Observations/Objective: Height 5\' 5"  (1.651 m).  Physical Exam  Physical Exam Constitutional:      General: She is not in acute distress. Pulmonary:     Effort: Pulmonary effort is normal. No respiratory distress.  Neurological:     Mental Status: She is alert and oriented to person, place, and time.  Psychiatric:        Mood and Affect: Mood normal.        Behavior: Behavior normal.   Back : no ttp per pt home exam over vertebrae Decreased ROM   Assessment and Plan Low back pain radiating to right leg No red flags, no clear indication for X-ray.  Start NSAIDS, HEAT , muscle relaxant and home PT.  return precautions given.     I discussed the assessment and treatment plan with the patient. The patient was provided an opportunity to ask questions and all were answered. The patient agreed with the plan and demonstrated an understanding of the instructions.   The patient was advised to call back or seek an in-person evaluation if the symptoms worsen or if the condition fails to improve as anticipated.     Lisa Lofts, MD

## 2018-07-31 NOTE — Progress Notes (Signed)
AVS and herniated disc stretches mailed to patient as instructed by Dr. Diona Browner.

## 2018-07-31 NOTE — Patient Instructions (Signed)
Start diclofenac twice daily for pain and inflammation.  Can use muscle relaxant at night as needed.  Heat on low back and start low back stretches.  Follow up if not improving in 2 weeks... in person visit. Call sooner if pain is severe or you cannot walk, new numbness and weakness.

## 2018-07-31 NOTE — Assessment & Plan Note (Signed)
No red flags, no clear indication for X-ray.  Start NSAIDS, HEAT , muscle relaxant and home PT.  return precautions given.

## 2018-09-23 ENCOUNTER — Telehealth: Payer: Self-pay | Admitting: Family Medicine

## 2018-09-24 NOTE — Telephone Encounter (Signed)
Please schedule CPE with fasting labs prior for sometime around 11/01/2018 or after.

## 2018-10-09 NOTE — Telephone Encounter (Signed)
Left message asking pt to call office 6/23/rbh

## 2018-10-26 NOTE — Telephone Encounter (Signed)
cpx 10/16 Labs 10/12 Pt aware

## 2018-12-16 ENCOUNTER — Other Ambulatory Visit: Payer: Self-pay | Admitting: Family Medicine

## 2018-12-26 ENCOUNTER — Other Ambulatory Visit: Payer: Self-pay | Admitting: Family Medicine

## 2018-12-26 DIAGNOSIS — Z1231 Encounter for screening mammogram for malignant neoplasm of breast: Secondary | ICD-10-CM

## 2018-12-30 NOTE — Progress Notes (Signed)
[place]  [phone] [phone]     ID: Lisa Salinas DOB: 02-09-1956  MR#: [id number]  MHW#:[id number]  Patient Care Team: Jinny Sanders, MD as PCP - General Malissie Musgrave, Virgie Dad, MD as Consulting Physician (Oncology) Irene Limbo, MD as Consulting Physician (Plastic Surgery) Fanny Skates, MD as Consulting Physician (General Surgery) OTHER MD:  CHIEF COMPLAINT: Estrogen receptor positive ductal carcinoma in situ  CURRENT TREATMENT: Tamoxifen   BREAST CANCER HISTORY: From the recent summary note:  I saw Lisa Salinas remotely for an invasive right-sided breast cancer initially diagnosed in 2006. We have requested the records for review, but from the patient's recollection: She was treated neoadjuvantly with tamoxifen, but on repeat imaging and there had been no significant response. She underwent right lumpectomy and sentinel lymph node sampling early in 2007. She tells me the lymph nodes were negative. She then received chemotherapy followed by radiation. She then took Aromasin for 5 years, and was discharged from follow-up here in 2012.  More recently, on 08/18/2016, she underwent bilateral screening mammography with tomography at the breast Center. This found the breast density to be category C. In the right breast there were calcifications warranting further evaluation and on 08/23/2016 she underwent a unilateral right diagnostic mammogram which found pleomorphic calcifications in the upper outer quadrant of the right breast spanning 0.5 cm. There was a second area of punctate calcifications adjacent to the biopsy site slightly more posterior.  On 08/25/2016 she underwent a biopsy of the main area of concern in the right breast, and this found ductal carcinoma in situ, grade 2, estrogen receptor 100% positive, progesterone receptor 10% positive, both with strong staining.  Her subsequent history is as detailed below   INTERVAL HISTORY: Lisa Salinas  returns today for follow-up and treatment of her noninvasive breast cancer and remote invasive breast cancer. She was last seen here on 12/26/2017.   She continues on tamoxifen.  She tolerates this well, with some hot flashes as the main side effect.  She is also on ibandronate monthly.  She finds it inconvenient but otherwise tolerates it well.  Her last bone density was September 2021.  Since her last visit here, she has not undergone any additional studies. She is scheduled for mammography on 02/07/2019.    REVIEW OF SYSTEMS: Lisa Salinas tells me generally she is doing well but sometimes when she "overdoes it" she will have palpitations and feels dizzy.  She denies chest pain or pressure, nausea, or passing out.  She says a decade or so ago she had a Holter monitor but she does not remember why that was placed or who placed it.  Aside from these issues her husband was found to be COVID-19 positive in June.  He was exposed in a trip he took with other firefighters.  He has mostly recovered although "he is still not back 100%".  She herself however has been tested and was negative.  A detailed review of systems today was otherwise stable   PAST MEDICAL HISTORY: Past Medical History:  Diagnosis Date  . Anxiety state, unspecified   . Basal cell carcinoma (BCC) of face   . Breast cancer, right breast ([facility]) 10/20/2004   S/P lumpectomy (04/2005) and chemo (03/2005)  . Depression   . Dizziness and giddiness   . Dysrhythmia   . Family history of breast cancer   . Personal history of chemotherapy 03/2005  . Personal history of radiation therapy 05/2005    PAST SURGICAL HISTORY: Past Surgical History:  Procedure Laterality Date  . ABDOMINAL HYSTERECTOMY     "left my ovaries; no cancer"  . BASAL CELL CARCINOMA EXCISION Right    side of face   . BREAST BIOPSY Bilateral    "2 on the left; 1 on the right; all benign"  . BREAST BIOPSY Right 10/20/2004   malignant  . BREAST EXCISIONAL BIOPSY Left    . BREAST LUMPECTOMY Right 04/2005  . COLONOSCOPY    . MASTECTOMY COMPLETE / SIMPLE Right 10/17/2016  . MASTECTOMY W/ SENTINEL NODE BIOPSY Right 10/17/2016   Procedure: RIGHT MASTECTOMY WITH SENTINEL LYMPH NODE BIOPSY;  Surgeon: Fanny Skates, MD;  Location: [facility];  Service: General;  Laterality: Right;  . TONSILLECTOMY  Childhood    FAMILY HISTORY Family History  Problem Relation Age of Onset  . Heart disease Mother   . Hypertension Mother   . Hyperlipidemia Mother   . Cancer Father 1       lung, smoker  . Breast cancer Maternal Aunt 16  . Breast cancer Paternal Aunt 1  . Breast cancer Maternal Aunt 55  . Breast cancer Cousin 75       paternal first cousin  The patient's father died at age 84 from lung cancer in the setting of tobacco abuse. The patient's mother is living at age 49. The patient has one sister, no brothers. The patient has multiple relatives with breast cancer on both sides of the family and was tested for the BRCA1 and 2 genes remotely and was found to be negative.  GYNECOLOGIC HISTORY:  No LMP recorded. Patient has had a hysterectomy. Menarche age 1, first live birth age 57, the patient is Lisa Salinas. She underwent hysterectomy without salpingo-oophorectomy at age 37. She did not have hormone replacement.  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 Her husband Lisa Salinas is a retired Airline pilot. He is now a used Teacher, early years/pre. Daughter Lisa Salinas works in the nutrition department of the [employer] school system. Son Lisa Salinas lives in [place] and is in Press photographer for [employer]. The patient has 3 granddaughters. She is a Psychologist, forensic.    ADVANCED DIRECTIVES:    HEALTH MAINTENANCE: Social History   Tobacco Use  . Smoking status: Never Smoker  . Smokeless tobacco: Never Used  Substance Use Topics  . Alcohol use: No  . Drug use: No     Colonoscopy:Due 2018  PAP:  Bone density:  12/21/2017 showed a T-score of -3.1 osteoporosis   Allergies  Allergen Reactions  . Sulfonamide Derivatives Rash    REACTION: [place]    Current Outpatient Medications  Medication Sig Dispense Refill  . calcium citrate-vitamin D (CITRACAL+D) 315-200 MG-UNIT per tablet Take 1 tablet by mouth 2 (two) times daily.      . cyclobenzaprine (FLEXERIL) 10 MG tablet Take 0.5-1 tablets (5-10 mg total) by mouth at bedtime as needed for muscle spasms. 15 tablet 0  . diclofenac (VOLTAREN) 75 MG EC tablet Take 1 tablet (75 mg total) by mouth 2 (two) times daily. 30 tablet 0  . ibandronate (BONIVA) 150 MG tablet Take 1 tablet (150 mg total) by mouth every 30 (thirty) days. Take in the morning with a full glass of water, on an empty stomach, and do not take anything else by mouth or lie down for the next 30 min. 5 tablet 4  . sertraline (ZOLOFT) 100 MG tablet TAKE 1 TABLET BY MOUTH EVERY DAY 90 tablet 0  .  tamoxifen (NOLVADEX) 20 MG tablet TAKE 1 TABLET BY MOUTH EVERY DAY 90 tablet 0  . traZODone (DESYREL) 50 MG tablet Take 0.5-1 tablets (25-50 mg total) by mouth at bedtime as needed for sleep. 30 tablet 3   No current facility-administered medications for this visit.     OBJECTIVE: Middle-aged white woman who appears stated age  63:   12/31/18 1403  BP: 123/70  Pulse: 76  Resp: 18  Temp: 98.3 F (36.8 C)  SpO2: 100%   Wt Readings from Last 3 Encounters:  12/31/18 171 lb 12.8 oz (77.9 kg)  04/02/18 167 lb (75.8 kg)  12/26/17 159 lb 3.2 oz (72.2 kg)   Body mass index is 28.59 kg/m.    ECOG FS:1 - Symptomatic but completely ambulatory  Ocular: Sclerae unicteric, pupils round and equal Ear-nose-throat: Wearing a mask Lymphatic: No cervical or supraclavicular adenopathy Lungs no rales or rhonchi Heart regular rate and rhythm Abd soft, nontender, positive bowel sounds MSK no focal spinal tenderness, no joint edema Neuro: non-focal, well-oriented, appropriate affect Breasts: Status  post right mastectomy with no evidence of chest wall recurrence.  The left breast is benign.  Both axillae are benign.  LAB RESULTS:  CMP     Component Value Date/Time   NA 141 12/26/2017 1058   K 4.1 12/26/2017 1058   CL 105 12/26/2017 1058   CO2 28 12/26/2017 1058   GLUCOSE 84 12/26/2017 1058   BUN 20 12/26/2017 1058   CREATININE 0.78 12/26/2017 1058   CALCIUM 9.4 12/26/2017 1058   PROT 6.7 12/26/2017 1058   ALBUMIN 3.9 12/26/2017 1058   AST 16 12/26/2017 1058   ALT 12 12/26/2017 1058   ALKPHOS 40 12/26/2017 1058   BILITOT 0.5 12/26/2017 1058   GFRNONAA >60 12/26/2017 1058   GFRAA >60 12/26/2017 1058    No results found for: TOTALPROTELP, ALBUMINELP, A1GS, A2GS, BETS, BETA2SER, GAMS, MSPIKE, SPEI  No results found for: Nils Pyle, [facility]  Lab Results  Component Value Date   WBC 6.4 12/31/2018   NEUTROABS 3.5 12/31/2018   HGB 13.6 12/31/2018   HCT 41.9 12/31/2018   MCV 90.7 12/31/2018   PLT 168 12/31/2018      Chemistry      Component Value Date/Time   NA 141 12/26/2017 1058   K 4.1 12/26/2017 1058   CL 105 12/26/2017 1058   CO2 28 12/26/2017 1058   BUN 20 12/26/2017 1058   CREATININE 0.78 12/26/2017 1058      Component Value Date/Time   CALCIUM 9.4 12/26/2017 1058   ALKPHOS 40 12/26/2017 1058   AST 16 12/26/2017 1058   ALT 12 12/26/2017 1058   BILITOT 0.5 12/26/2017 1058       Lab Results  Component Value Date   LABCA2 22 08/31/2010    No components found for: [id number]  No results for input(s): INR in the last 168 hours.  Urinalysis    Component Value Date/Time   COLORURINE lt. yellow 05/20/2010 1551   APPEARANCEUR Hazy 05/20/2010 1551   LABSPEC <1.005 05/20/2010 1551   PHURINE 6.0 05/20/2010 1551   HGBUR trace-lysed 05/20/2010 1551   BILIRUBINUR neg 06/29/2017 1454   KETONESUR negative 02/05/2015 0851   PROTEINUR Neg 06/29/2017 1454   UROBILINOGEN 0.2 06/29/2017 1454   UROBILINOGEN 0.2 05/20/2010 1551   NITRITE  Neg 06/29/2017 1454   NITRITE negative 05/20/2010 1551   LEUKOCYTESUR Small (1+) (A) 06/29/2017 1454     STUDIES: No results found.   ELIGIBLE FOR AVAILABLE RESEARCH  PROTOCOL: No  ASSESSMENT: 63 y.o. BRCA negative [place] woman  (1) status post right lumpectomy and sentinel lymph node sampling in January 2007 for a ypT1 ypN0 breast cancer  (a) status post neoadjuvant tamoxifen and Herceptin treatment with no significant response  (b) intermediate Oncotype score predicted a 10 year risk of recurrence outside the breast of 14% if she took tamoxifen for 5 years.  (c) status post adjuvant chemotherapy with weekly paclitaxel 8 doses  (d) status post adjuvant radiation  (e) completed 1 year of trastuzumab  (f) status post adjuvant exemestane for 5 years, completed May 2012   (2) status post right breast upper outer quadrant biopsy 08/25/2016 for ductal carcinoma in situ, grade 2, estrogen and progesterone receptor positive  (3) right mastectomy 10/17/2016 confirmed ductal carcinoma in situ, grade 1, measuring 2.8 cm, with negative margins and all 5 sentinel lymph nodes clear.  (4) tamoxifen started 09/15/2016--to be continued through May 2021 (2 years)    (5) further genetics testing 09/27/2016 through the Common Hereditary cancer panel.offered by [facility] i found no deleterious mutations in APC, ATM, AXIN2, BARD1, BMPR1A, BRCA1, BRCA2, BRIP1, CDH1, CDKN2A (p14ARF), CDKN2A (p16INK4a), CHEK2, CTNNA1, DICER1, EPCAM (Deletion/duplication testing only), GREM1 (promoter region deletion/duplication testing only), KIT, MEN1, MLH1, MSH2, MSH3, MSH6, MUTYH, NBN, NF1, NHTL1, PALB2, PDGFRA, PMS2, POLD1, POLE, PTEN, RAD50, RAD51C, RAD51D, SDHB, SDHC, SDHD, SMAD4, SMARCA4. STK11, TP53, TSC1, TSC2, and VHL.  The following genes were evaluated for sequence changes only: SDHA and HOXB13 c.251G>A variant only.   (6) the patient has decided against reconstruction  (7) osteoporosis : bone  density on 12/21/2017 showed a T-score of -3.1 at the left femur neck  (a) ibandronate/Boniva started 12/26/2017     PLAN: Calayah is now 13-1/2 years out from definitive surgery for her invasive breast cancer, and 2 years out from mastectomy for her noninvasive breast cancer.  There is no evidence of disease recurrence.  This is very favorable.  Because she took exemestane 5 years previously, we decided she would only take tamoxifen for 2 years, bringing the total antiestrogens to 7 years.  This means she will finish her exemestane May 2021.  She will have mammography in October of this year and then again October of next year.  With the 2021 mammogram we will obtain a repeat bone density.  Until then she will continue on the ibandronate.  She will see me one last time November 2021 after which she will be ready to "graduate".  I am not sure what she is feeling with regards to palpitations and dizziness.  I taught her how to check her pulse.  She does have a pulse ox at home and she sometimes watches it when her heart goes "funny" but she cannot describe what she sees under those circumstances.  I think it would be safest for her to have a cardiology evaluation and were putting that in place  She knows to call for any other issue that may develop before the next visit.   Glorimar Stroope, Virgie Dad, MD  12/31/18 2:08 PM Medical Oncology and Hematology [facility] [place], [place] [address] Tel. [phone]    Fax. [phone]  I, Jacqualyn Posey am acting as a Education administrator for Chauncey Cruel, MD.   I, Lurline Del MD, have reviewed the above documentation for accuracy and completeness, and I agree with the above.

## 2018-12-31 ENCOUNTER — Other Ambulatory Visit: Payer: Self-pay

## 2018-12-31 ENCOUNTER — Inpatient Hospital Stay (HOSPITAL_BASED_OUTPATIENT_CLINIC_OR_DEPARTMENT_OTHER): Payer: BC Managed Care – PPO | Admitting: Oncology

## 2018-12-31 ENCOUNTER — Inpatient Hospital Stay: Payer: BC Managed Care – PPO | Attending: Oncology

## 2018-12-31 ENCOUNTER — Telehealth: Payer: Self-pay | Admitting: *Deleted

## 2018-12-31 VITALS — BP 123/70 | HR 76 | Temp 98.3°F | Resp 18 | Ht 65.0 in | Wt 171.8 lb

## 2018-12-31 DIAGNOSIS — Z79810 Long term (current) use of selective estrogen receptor modulators (SERMs): Secondary | ICD-10-CM | POA: Insufficient documentation

## 2018-12-31 DIAGNOSIS — C50911 Malignant neoplasm of unspecified site of right female breast: Secondary | ICD-10-CM

## 2018-12-31 DIAGNOSIS — D0511 Intraductal carcinoma in situ of right breast: Secondary | ICD-10-CM

## 2018-12-31 DIAGNOSIS — Z9221 Personal history of antineoplastic chemotherapy: Secondary | ICD-10-CM | POA: Diagnosis not present

## 2018-12-31 DIAGNOSIS — Z9011 Acquired absence of right breast and nipple: Secondary | ICD-10-CM | POA: Diagnosis not present

## 2018-12-31 DIAGNOSIS — Z170 Estrogen receptor positive status [ER+]: Secondary | ICD-10-CM | POA: Insufficient documentation

## 2018-12-31 DIAGNOSIS — M818 Other osteoporosis without current pathological fracture: Secondary | ICD-10-CM | POA: Diagnosis not present

## 2018-12-31 DIAGNOSIS — M810 Age-related osteoporosis without current pathological fracture: Secondary | ICD-10-CM | POA: Diagnosis not present

## 2018-12-31 DIAGNOSIS — C50811 Malignant neoplasm of overlapping sites of right female breast: Secondary | ICD-10-CM

## 2018-12-31 LAB — CBC WITH DIFFERENTIAL/PLATELET
Abs Immature Granulocytes: 0.01 10*3/uL (ref 0.00–0.07)
Basophils Absolute: 0 10*3/uL (ref 0.0–0.1)
Basophils Relative: 0 %
Eosinophils Absolute: 0.2 10*3/uL (ref 0.0–0.5)
Eosinophils Relative: 3 %
HCT: 41.9 % (ref 36.0–46.0)
Hemoglobin: 13.6 g/dL (ref 12.0–15.0)
Immature Granulocytes: 0 %
Lymphocytes Relative: 34 %
Lymphs Abs: 2.2 10*3/uL (ref 0.7–4.0)
MCH: 29.4 pg (ref 26.0–34.0)
MCHC: 32.5 g/dL (ref 30.0–36.0)
MCV: 90.7 fL (ref 80.0–100.0)
Monocytes Absolute: 0.5 10*3/uL (ref 0.1–1.0)
Monocytes Relative: 8 %
Neutro Abs: 3.5 10*3/uL (ref 1.7–7.7)
Neutrophils Relative %: 55 %
Platelets: 168 10*3/uL (ref 150–400)
RBC: 4.62 MIL/uL (ref 3.87–5.11)
RDW: 12.5 % (ref 11.5–15.5)
WBC: 6.4 10*3/uL (ref 4.0–10.5)
nRBC: 0 % (ref 0.0–0.2)

## 2018-12-31 LAB — COMPREHENSIVE METABOLIC PANEL
ALT: 12 U/L (ref 0–44)
AST: 19 U/L (ref 15–41)
Albumin: 4.3 g/dL (ref 3.5–5.0)
Alkaline Phosphatase: 45 U/L (ref 38–126)
Anion gap: 7 (ref 5–15)
BUN: 13 mg/dL (ref 8–23)
CO2: 29 mmol/L (ref 22–32)
Calcium: 9.6 mg/dL (ref 8.9–10.3)
Chloride: 104 mmol/L (ref 98–111)
Creatinine, Ser: 0.86 mg/dL (ref 0.44–1.00)
GFR calc Af Amer: >60 mL/min (ref >60)
GFR calc non Af Amer: >60 mL/min (ref >60)
Glucose, Bld: 79 mg/dL (ref 70–99)
Potassium: 4.3 mmol/L (ref 3.5–5.1)
Sodium: 140 mmol/L (ref 135–145)
Total Bilirubin: 0.3 mg/dL (ref 0.3–1.2)
Total Protein: 6.9 g/dL (ref 6.5–8.1)

## 2018-12-31 NOTE — Telephone Encounter (Signed)
-----   Message from Burtis Junes, NP sent at 12/31/2018  3:15 PM EDT ----- Can we get her new patient spot  lori ----- Message ----- From: Chauncey Cruel, MD Sent: 12/31/2018   2:33 PM EDT To: Burtis Junes, NP  Salley Scarlet Cecille Rubin! I am directing this pt to you (and the cardiologist of your choice) for evaluation of her palpitations accompanied by dizziness. From a breast cancer point of view she is fine  Thanks!  Gus

## 2018-12-31 NOTE — Telephone Encounter (Signed)
S/w pt is aware has a new pt appt with Cecille Rubin in October per Dr. Jana Hakim.

## 2019-01-01 ENCOUNTER — Telehealth: Payer: Self-pay | Admitting: Oncology

## 2019-01-01 NOTE — Telephone Encounter (Signed)
I could not reach patient regarding schedule  °

## 2019-01-07 ENCOUNTER — Other Ambulatory Visit: Payer: Self-pay | Admitting: Oncology

## 2019-01-18 NOTE — Progress Notes (Signed)
CARDIOLOGY OFFICE NOTE  Date:  01/21/2019    Lisa Salinas Date of Birth: Dec 10, 1955 Medical Record [id number]  PCP:  Jinny Sanders, MD  Cardiologist:  Servando Snare     Chief Complaint  Patient presents with  . Palpitations    New patient visit     History of Present Illness: Lisa Salinas is a 63 y.o. female who presents today for a new patient visit - requested by Dr. Jana Hakim.   She has a history of recurrent breast cancer - first found in 2006 - with surgery/chemo/XRT and medical therapy. Has had recurrence in 2018 - on tamoxifen.   She was seen last month by Dr. Jana Hakim - endorsed palpitations and dizziness. Husband had had COVID back in June - she herself tested negative.   The patient does not have symptoms concerning for COVID-19 infection (fever, chills, cough, or new shortness of breath).   Comes in today. Here alone. She is retired from the [employer] system - but has returned part time. Her father was a long term smoker and died at 31 with lung cancer. Her mother is alive at 69 and has had prior stent (late 59's) along with being on Eliquis for AF. She does not smoke. No alcohol use. She did receive chemotherapy with her first bout of breast cancer - does not sound like she got Adriamycin. She notes several symptoms - most worrisome to her are "dizzy spells" where she feels presyncopal. She has not had frank syncope. This has been going on since her second surgery in 2018 but is getting more frequent and more severe - especially over the past month. With very little activity, she will have to "stop and rest". She has noted with walking and with doing house hold chores a sensation of a "pain" below the xyphoid - goes away with rest. This pain can be followed by shortness of breath/dizziness/presyncope. She does endorse a sensation of her heart beating too fast/hard/skipping. She has 2 cups of coffee - half & half. No chocolate. She has been told she snores.  She tries to be active with her grand kids but has no real exercise program. She notes a spell with walking at the beach where she had sub xyphoid discomfort and thought she would pass out.    Past Medical History:  Diagnosis Date  . Anxiety state, unspecified   . Basal cell carcinoma (BCC) of face   . Breast cancer, right breast ([place]) 10/20/2004   S/P lumpectomy (04/2005) and chemo (03/2005)  . Depression   . Dizziness and giddiness   . Dysrhythmia   . Family history of breast cancer   . Personal history of chemotherapy 03/2005  . Personal history of radiation therapy 05/2005    Past Surgical History:  Procedure Laterality Date  . ABDOMINAL HYSTERECTOMY     "left my ovaries; no cancer"  . BASAL CELL CARCINOMA EXCISION Right    side of face   . BREAST BIOPSY Bilateral    "2 on the left; 1 on the right; all benign"  . BREAST BIOPSY Right 10/20/2004   malignant  . BREAST EXCISIONAL BIOPSY Left   . BREAST LUMPECTOMY Right 04/2005  . COLONOSCOPY    . MASTECTOMY COMPLETE / SIMPLE Right 10/17/2016  . MASTECTOMY W/ SENTINEL NODE BIOPSY Right 10/17/2016   Procedure: RIGHT MASTECTOMY WITH SENTINEL LYMPH NODE BIOPSY;  Surgeon: Fanny Skates, MD;  Location: [facility];  Service: General;  Laterality: Right;  .  TONSILLECTOMY  Childhood     Medications: Current Meds  Medication Sig  . calcium citrate-vitamin D (CITRACAL+D) 315-200 MG-UNIT per tablet Take 1 tablet by mouth 2 (two) times daily.    Marland Kitchen ibandronate (BONIVA) 150 MG tablet TAKE 1 TABLET BY MOUTH EVERY 30 DAYS. TAKE IN THE MORNING WITH A FULL GLASS OF WATER ON EMPTY STOMACH. DO NOT TAKE ANYTHING ELSE BY MOUTH OR LIE DOWN FOR THE NEXT 30 MINUTES  . sertraline (ZOLOFT) 100 MG tablet TAKE 1 TABLET BY MOUTH EVERY DAY  . tamoxifen (NOLVADEX) 20 MG tablet TAKE 1 TABLET BY MOUTH EVERY DAY  . traZODone (DESYREL) 50 MG tablet Take 0.5-1 tablets (25-50 mg total) by mouth at bedtime as needed for sleep.     Allergies: Allergies  Allergen  Reactions  . Sulfonamide Derivatives Rash    REACTION: [place]    Social History: The patient  reports that she has never smoked. She has never used smokeless tobacco. She reports that she does not drink alcohol or use drugs.   Family History: The patient's family history includes Breast cancer (age of onset: 84) in her maternal aunt; Breast cancer (age of onset: 55) in her maternal aunt; Breast cancer (age of onset: 54) in her cousin; Breast cancer (age of onset: 32) in her paternal aunt; Cancer (age of onset: 5) in her father; Heart disease in her mother; Hyperlipidemia in her mother; Hypertension in her mother.   Review of Systems: Please see the history of present illness.   All other systems are reviewed and negative.   Physical Exam: VS:  BP 110/74   Pulse 75   Ht 5\' 5"  (1.651 m)   Wt 170 lb 12.8 oz (77.5 kg)   SpO2 97%   BMI 28.42 kg/m  .  BMI Body mass index is 28.42 kg/m.  Wt Readings from Last 3 Encounters:  01/21/19 170 lb 12.8 oz (77.5 kg)  12/31/18 171 lb 12.8 oz (77.9 kg)  04/02/18 167 lb (75.8 kg)    General: Pleasant. Well developed, well nourished and in no acute distress.   HEENT: Normal.  Neck: Supple, no JVD, carotid bruits, or masses noted.  Cardiac: Regular rate and rhythm. No murmurs, rubs, or gallops. No edema.  Respiratory:  Lungs are clear to auscultation bilaterally with normal work of breathing.  GI: Soft and nontender.  MS: No deformity or atrophy. Gait and ROM intact.  Skin: Warm and dry. Color is normal.  Neuro:  Strength and sensation are intact and no gross focal deficits noted.  Psych: Alert, appropriate and with normal affect.   LABORATORY DATA:  EKG:  EKG is ordered today. This demonstrates NSR with no acute changes.  Lab Results  Component Value Date   WBC 6.4 12/31/2018   HGB 13.6 12/31/2018   HCT 41.9 12/31/2018   PLT 168 12/31/2018   GLUCOSE 79 12/31/2018   CHOL 142 11/01/2017   TRIG 71.0 11/01/2017   HDL 54.40 11/01/2017    LDLCALC 74 11/01/2017   ALT 12 12/31/2018   AST 19 12/31/2018   NA 140 12/31/2018   K 4.3 12/31/2018   CL 104 12/31/2018   CREATININE 0.86 12/31/2018   BUN 13 12/31/2018   CO2 29 12/31/2018   TSH 0.65 05/02/2017     BNP (last 3 results) No results for input(s): BNP in the last 8760 hours.  ProBNP (last 3 results) No results for input(s): PROBNP in the last 8760 hours.   Other Studies Reviewed Today:  Assessment/Plan:  1. Chest pain with exertion/dizziness/presyncope/palpitations - her most worrisome feature is her mother who has CAD (with stent in her late 35's) and also has AF. Also with history of chemotherapy with her past breast cancer treatment. We will arrange for cardiac CT scan to rule out CAD, an echo to make sure structurally her heart is ok and an event monitor to look for AF. Further disposition pending. Will check TSH along with a BMET today. Will give one dose of beta blocker prior to the CT. For now, no change in her medicines.   2. Recurrent breast cancer - per Dr. Jana Hakim.   3. COVID-19 Education: The signs and symptoms of COVID-19 were discussed with the patient and how to seek care for testing (follow up with PCP or arrange E-visit).  The importance of social distancing, staying at home, hand hygiene and wearing a mask when out in public were discussed today.  Current medicines are reviewed with the patient today.  The patient does not have concerns regarding medicines other than what has been noted above.  The following changes have been made:  See above.  Labs/ tests ordered today include:    Orders Placed This Encounter  Procedures  . CT CORONARY MORPH W/CTA COR W/SCORE W/CA W/CM &/OR WO/CM  . CT CORONARY FRACTIONAL FLOW RESERVE DATA PREP  . CT CORONARY FRACTIONAL FLOW RESERVE FLUID ANALYSIS  . Basic metabolic panel  . TSH  . CARDIAC EVENT MONITOR  . EKG 12-Lead  . ECHOCARDIOGRAM COMPLETE     Disposition:   Further disposition pending the  outcome of her studies.   Patient is agreeable to this plan and will call if any problems develop in the interim.   SignedTruitt Merle, NP  01/21/2019 11:40 AM  [facility] [address] [place], [place]  [address] Phone: [phone] Fax: [phone]

## 2019-01-21 ENCOUNTER — Other Ambulatory Visit: Payer: Self-pay

## 2019-01-21 ENCOUNTER — Encounter: Payer: Self-pay | Admitting: Nurse Practitioner

## 2019-01-21 ENCOUNTER — Ambulatory Visit: Payer: BC Managed Care – PPO | Admitting: Nurse Practitioner

## 2019-01-21 ENCOUNTER — Telehealth: Payer: Self-pay | Admitting: *Deleted

## 2019-01-21 VITALS — BP 110/74 | HR 75 | Ht 65.0 in | Wt 170.8 lb

## 2019-01-21 DIAGNOSIS — R55 Syncope and collapse: Secondary | ICD-10-CM | POA: Diagnosis not present

## 2019-01-21 DIAGNOSIS — Z8249 Family history of ischemic heart disease and other diseases of the circulatory system: Secondary | ICD-10-CM

## 2019-01-21 DIAGNOSIS — R002 Palpitations: Secondary | ICD-10-CM | POA: Diagnosis not present

## 2019-01-21 DIAGNOSIS — R079 Chest pain, unspecified: Secondary | ICD-10-CM

## 2019-01-21 DIAGNOSIS — Z7189 Other specified counseling: Secondary | ICD-10-CM

## 2019-01-21 DIAGNOSIS — I259 Chronic ischemic heart disease, unspecified: Secondary | ICD-10-CM

## 2019-01-21 MED ORDER — METOPROLOL TARTRATE 100 MG PO TABS: 100.0000 mg | ORAL_TABLET | ORAL | 0 refills | Status: DC

## 2019-01-21 NOTE — Patient Instructions (Addendum)
After Visit Summary:  We will be checking the following labs today - BMET & TSH   Medication Instructions:    Continue with your current medicines.    If you need a refill on your cardiac medications before your next appointment, please call your pharmacy.     Testing/Procedures To Be Arranged:  Echocardiogram - to make sure that structurally your heart is ok  Cardiac CT scan - to make sure you do not have blockage - I will have you take a dose of Metoprolol 2 hours prior to this study to slow your heart rate down so we can get good images - this has been sent to your pharmacy.   Event monitor   Follow-Up:   We will see how your studies turn out and then decide about follow up.     At [facility], you and your health needs are our priority.  As part of our continuing mission to provide you with exceptional heart care, we have created designated Provider Care Teams.  These Care Teams include your primary Cardiologist (physician) and Advanced Practice Providers (APPs -  Physician Assistants and Nurse Practitioners) who all work together to provide you with the care you need, when you need it.  Special Instructions:  . Stay safe, stay home, wash your hands for at least 20 seconds and wear a mask when out in public.  . It was good to talk with you today.     Your cardiac CT will be scheduled at one of the below locations:   [facility] [address] [place], [place] [address] [phone]  Please arrive at the [facility] main entrance of [facility] 30-45 minutes prior to test start time. Proceed to the [facility] Radiology Department (first floor) to check-in and test prep.   Please follow these instructions carefully (unless otherwise directed):   On the Night Before the Test: . Be sure to Drink plenty of water. . Do not consume any caffeinated/decaffeinated beverages or chocolate 12 hours prior to your test. . Do not take any  antihistamines 12 hours prior to your test. .   On the Day of the Test: . Drink plenty of water. Do not drink any water within one hour of the test. . Do not eat any food 4 hours prior to the test. . You may take your regular medications prior to the test.  . Take metoprolol (Lopressor) two hours prior to test. . HOLD Furosemide/Hydrochlorothiazide morning of the test. . FEMALES- please wear underwire-free bra if available   After the Test: . Drink plenty of water. . After receiving IV contrast, you may experience a mild flushed feeling. This is normal. . On occasion, you may experience a mild rash up to 24 hours after the test. This is not dangerous. If this occurs, you can take Benadryl 25 mg and increase your fluid intake. . If you experience trouble breathing, this can be serious. If it is severe call 911 IMMEDIATELY. If it is mild, please call our office. . If you take any of these medications: Glipizide/Metformin, Avandament, Glucavance, please do not take 48 hours after completing test unless otherwise instructed.  Please contact the cardiac imaging nurse navigator should you have any questions/concerns Marchia Bond, RN Navigator Cardiac Imaging [facility] [phone] Office  [phone] Cell     Call the [place] office at [phone] if you have any questions, problems or concerns.

## 2019-01-21 NOTE — Telephone Encounter (Signed)
[employer] to ship a 30 day cardiac event monitor to the patients home.  Patient aware instructions are included in the monitor kit.

## 2019-01-22 LAB — BASIC METABOLIC PANEL
BUN/Creatinine Ratio: 22 (ref 12–28)
BUN: 14 mg/dL (ref 8–27)
CO2: 25 mmol/L (ref 20–29)
Calcium: 9.3 mg/dL (ref 8.7–10.3)
Chloride: 102 mmol/L (ref 96–106)
Creatinine, Ser: 0.63 mg/dL (ref 0.57–1.00)
GFR calc Af Amer: 110 mL/min/{1.73_m2} (ref >59)
GFR calc non Af Amer: 95 mL/min/{1.73_m2} (ref >59)
Glucose: 84 mg/dL (ref 65–99)
Potassium: 4.3 mmol/L (ref 3.5–5.2)
Sodium: 141 mmol/L (ref 134–144)

## 2019-01-22 LAB — TSH: TSH: 1.01 u[IU]/mL (ref 0.450–4.500)

## 2019-01-25 ENCOUNTER — Other Ambulatory Visit: Payer: Self-pay

## 2019-01-25 ENCOUNTER — Ambulatory Visit (HOSPITAL_COMMUNITY): Payer: BC Managed Care – PPO | Attending: Cardiology

## 2019-01-25 ENCOUNTER — Telehealth: Payer: Self-pay | Admitting: Family Medicine

## 2019-01-25 DIAGNOSIS — R002 Palpitations: Secondary | ICD-10-CM | POA: Diagnosis not present

## 2019-01-25 DIAGNOSIS — R079 Chest pain, unspecified: Secondary | ICD-10-CM | POA: Diagnosis present

## 2019-01-25 DIAGNOSIS — R55 Syncope and collapse: Secondary | ICD-10-CM | POA: Insufficient documentation

## 2019-01-25 DIAGNOSIS — I259 Chronic ischemic heart disease, unspecified: Secondary | ICD-10-CM | POA: Diagnosis present

## 2019-01-25 DIAGNOSIS — Z8249 Family history of ischemic heart disease and other diseases of the circulatory system: Secondary | ICD-10-CM | POA: Diagnosis not present

## 2019-01-25 DIAGNOSIS — Z7189 Other specified counseling: Secondary | ICD-10-CM | POA: Diagnosis present

## 2019-01-25 DIAGNOSIS — Z13220 Encounter for screening for lipoid disorders: Secondary | ICD-10-CM

## 2019-01-25 NOTE — Telephone Encounter (Signed)
-----   Message from Ellamae Sia sent at 01/21/2019 10:38 AM EDT ----- Regarding: Lab orders fro Monday, 10.12.20 Patient is scheduled for CPX labs, please order future labs, Thanks , Karna Christmas

## 2019-01-28 ENCOUNTER — Other Ambulatory Visit (INDEPENDENT_AMBULATORY_CARE_PROVIDER_SITE_OTHER): Payer: BC Managed Care – PPO

## 2019-01-28 ENCOUNTER — Other Ambulatory Visit: Payer: Self-pay

## 2019-01-28 DIAGNOSIS — Z13220 Encounter for screening for lipoid disorders: Secondary | ICD-10-CM | POA: Diagnosis not present

## 2019-01-28 LAB — LIPID PANEL
Cholesterol: 163 mg/dL (ref 0–200)
HDL: 51.3 mg/dL (ref >39.00)
LDL Cholesterol: 100 mg/dL — ABNORMAL HIGH (ref 0–99)
NonHDL: 111.8
Total CHOL/HDL Ratio: 3
Triglycerides: 61 mg/dL (ref 0.0–149.0)
VLDL: 12.2 mg/dL (ref 0.0–40.0)

## 2019-01-28 LAB — COMPREHENSIVE METABOLIC PANEL
ALT: 11 U/L (ref 0–35)
AST: 16 U/L (ref 0–37)
Albumin: 4.3 g/dL (ref 3.5–5.2)
Alkaline Phosphatase: 34 U/L — ABNORMAL LOW (ref 39–117)
BUN: 14 mg/dL (ref 6–23)
CO2: 30 mEq/L (ref 19–32)
Calcium: 9.5 mg/dL (ref 8.4–10.5)
Chloride: 105 mEq/L (ref 96–112)
Creatinine, Ser: 0.69 mg/dL (ref 0.40–1.20)
GFR: 85.64 mL/min (ref >60.00)
Glucose, Bld: 84 mg/dL (ref 70–99)
Potassium: 4 mEq/L (ref 3.5–5.1)
Sodium: 141 mEq/L (ref 135–145)
Total Bilirubin: 0.6 mg/dL (ref 0.2–1.2)
Total Protein: 6.9 g/dL (ref 6.0–8.3)

## 2019-01-28 NOTE — Progress Notes (Signed)
No critical labs need to be addressed urgently. We will discuss labs in detail at upcoming office visit.   

## 2019-01-30 ENCOUNTER — Ambulatory Visit (INDEPENDENT_AMBULATORY_CARE_PROVIDER_SITE_OTHER): Payer: BC Managed Care – PPO

## 2019-01-30 DIAGNOSIS — R55 Syncope and collapse: Secondary | ICD-10-CM | POA: Diagnosis not present

## 2019-01-30 DIAGNOSIS — Z8249 Family history of ischemic heart disease and other diseases of the circulatory system: Secondary | ICD-10-CM

## 2019-01-30 DIAGNOSIS — R0789 Other chest pain: Secondary | ICD-10-CM | POA: Diagnosis not present

## 2019-01-30 DIAGNOSIS — R002 Palpitations: Secondary | ICD-10-CM

## 2019-01-30 DIAGNOSIS — I259 Chronic ischemic heart disease, unspecified: Secondary | ICD-10-CM

## 2019-02-01 ENCOUNTER — Ambulatory Visit (INDEPENDENT_AMBULATORY_CARE_PROVIDER_SITE_OTHER): Payer: BC Managed Care – PPO | Admitting: Family Medicine

## 2019-02-01 ENCOUNTER — Encounter: Payer: Self-pay | Admitting: Family Medicine

## 2019-02-01 ENCOUNTER — Encounter: Payer: BC Managed Care – PPO | Admitting: Family Medicine

## 2019-02-01 ENCOUNTER — Other Ambulatory Visit: Payer: Self-pay

## 2019-02-01 VITALS — BP 102/64 | HR 88 | Temp 98.0°F | Ht 65.0 in | Wt 170.2 lb

## 2019-02-01 DIAGNOSIS — Z23 Encounter for immunization: Secondary | ICD-10-CM | POA: Diagnosis not present

## 2019-02-01 DIAGNOSIS — F331 Major depressive disorder, recurrent, moderate: Secondary | ICD-10-CM

## 2019-02-01 DIAGNOSIS — Z0000 Encounter for general adult medical examination without abnormal findings: Secondary | ICD-10-CM

## 2019-02-01 DIAGNOSIS — C50911 Malignant neoplasm of unspecified site of right female breast: Secondary | ICD-10-CM

## 2019-02-01 DIAGNOSIS — M810 Age-related osteoporosis without current pathological fracture: Secondary | ICD-10-CM

## 2019-02-01 DIAGNOSIS — C50811 Malignant neoplasm of overlapping sites of right female breast: Secondary | ICD-10-CM | POA: Diagnosis not present

## 2019-02-01 DIAGNOSIS — Z170 Estrogen receptor positive status [ER+]: Secondary | ICD-10-CM

## 2019-02-01 NOTE — Assessment & Plan Note (Signed)
ON boniva

## 2019-02-01 NOTE — Addendum Note (Signed)
Addended by: Carter Kitten on: 02/01/2019 03:09 PM   Modules accepted: Orders

## 2019-02-01 NOTE — Progress Notes (Signed)
Chief Complaint  Patient presents with  . Annual Exam    History of Present Illness: HPI  The patient is here for annual wellness exam and preventative care.     Currently seeing cardiologist for dizziness eval, Wearing cardiac  Monitor for 1 month. ECHO: nml EF.  Spells of dizzines Recurrent breast cancer.. in active treatment with tamoxifem. Followed by Dr. Griffith Citron  MDD, recurrent: on sertraline 100 mg daily, trazodone for sleep prn. PHQ9: 3 She is working Warehouse manager.   Exercise: 3 times walking week  Diet: healthy   Has been followed by Dr. Earlean Shawl for internal hemorrhoids, treated with band ligation  COVID 19 screen No recent travel or known exposure to [place] The patient denies respiratory symptoms of COVID 19 at this time.  The importance of social distancing was discussed today.   Review of Systems  Constitutional: Negative for chills and fever.  HENT: Negative for congestion and ear pain.   Eyes: Negative for pain and redness.  Respiratory: Negative for cough and shortness of breath.   Cardiovascular: Negative for chest pain, palpitations and leg swelling.  Gastrointestinal: Negative for abdominal pain, blood in stool, constipation, diarrhea, nausea and vomiting.  Genitourinary: Negative for dysuria.  Musculoskeletal: Negative for falls and myalgias.  Skin: Negative for rash.  Neurological: Negative for dizziness.  Psychiatric/Behavioral: Negative for depression. The patient is not nervous/anxious.       Past Medical History:  Diagnosis Date  . Anxiety state, unspecified   . Basal cell carcinoma (BCC) of face   . Breast cancer, right breast ([place]) 10/20/2004   S/P lumpectomy (04/2005) and chemo (03/2005)  . Depression   . Dizziness and giddiness   . Dysrhythmia   . Family history of breast cancer   . Personal history of chemotherapy 03/2005  . Personal history of radiation therapy 05/2005    reports that she has never smoked. She has never used smokeless  tobacco. She reports that she does not drink alcohol or use drugs.   Current Outpatient Medications:  .  calcium citrate-vitamin D (CITRACAL+D) 315-200 MG-UNIT per tablet, Take 1 tablet by mouth 2 (two) times daily.  , Disp: , Rfl:  .  ibandronate (BONIVA) 150 MG tablet, TAKE 1 TABLET BY MOUTH EVERY 30 DAYS. TAKE IN THE MORNING WITH A FULL GLASS OF WATER ON EMPTY STOMACH. DO NOT TAKE ANYTHING ELSE BY MOUTH OR LIE DOWN FOR THE NEXT 30 MINUTES, Disp: 3 tablet, Rfl: 8 .  metoprolol tartrate (LOPRESSOR) 100 MG tablet, Take 1 tablet (100 mg total) by mouth as directed. Take one tablet 2 hours prior to your Cardiac CT study, Disp: 1 tablet, Rfl: 0 .  sertraline (ZOLOFT) 100 MG tablet, TAKE 1 TABLET BY MOUTH EVERY DAY, Disp: 90 tablet, Rfl: 0 .  tamoxifen (NOLVADEX) 20 MG tablet, TAKE 1 TABLET BY MOUTH EVERY DAY, Disp: 90 tablet, Rfl: 0 .  traZODone (DESYREL) 50 MG tablet, Take 0.5-1 tablets (25-50 mg total) by mouth at bedtime as needed for sleep., Disp: 30 tablet, Rfl: 3   Observations/Objective: Blood pressure 102/64, pulse 88, temperature 98 F (36.7 C), temperature source Oral, height 5\' 5"  (1.651 m), weight 170 lb 4 oz (77.2 kg), SpO2 96 %.  Physical Exam Constitutional:      General: She is not in acute distress.    Appearance: Normal appearance. She is well-developed. She is not ill-appearing or toxic-appearing.  HENT:     Head: Normocephalic.     Right Ear: Hearing, tympanic membrane, ear canal  and external ear normal.     Left Ear: Hearing, tympanic membrane, ear canal and external ear normal.     Nose: Nose normal.  Eyes:     General: Lids are normal. Lids are everted, no foreign bodies appreciated.     Conjunctiva/sclera: Conjunctivae normal.     Pupils: Pupils are equal, round, and reactive to light.  Neck:     Musculoskeletal: Normal range of motion and neck supple.     Thyroid: No thyroid mass or thyromegaly.     Vascular: No carotid bruit.     Trachea: Trachea normal.   Cardiovascular:     Rate and Rhythm: Normal rate and regular rhythm.     Heart sounds: Normal heart sounds, S1 normal and S2 normal. No murmur. No gallop.   Pulmonary:     Effort: Pulmonary effort is normal. No respiratory distress.     Breath sounds: Normal breath sounds. No wheezing, rhonchi or rales.  Abdominal:     General: Bowel sounds are normal. There is no distension or abdominal bruit.     Palpations: Abdomen is soft. There is no fluid wave or mass.     Tenderness: There is no abdominal tenderness. There is no guarding or rebound.     Hernia: No hernia is present.  Lymphadenopathy:     Cervical: No cervical adenopathy.  Skin:    General: Skin is warm and dry.     Findings: No rash.  Neurological:     Mental Status: She is alert.     Cranial Nerves: No cranial nerve deficit.     Sensory: No sensory deficit.  Psychiatric:        Mood and Affect: Mood is not anxious or depressed.        Speech: Speech normal.        Behavior: Behavior normal. Behavior is cooperative.        Judgment: Judgment normal.      Assessment and Plan The patient's preventative maintenance and recommended screening tests for an annual wellness exam were reviewed in full today. Brought up to date unless services declined.  Counselled on the importance of diet, exercise, and its role in overall health and mortality. The patient's FH and SH was reviewed, including their home life, tobacco status, and drug and alcohol status.   Had total hysterectomy .Marland Kitchenno further paps needed. Mammogram: scheduled 01/2019 Colon:  01/2017 nml , repeat in 10 years. Vaccines: uptodate,  Due for flu and shingles vaccine HIV: refused DEXA: 12/2017  osteoporosis.On tamoxifen for 2  year course. On boniva as well   Major depression, recurrent ([place]) Well controlled. Continue current medication.   Malignant neoplasm of overlapping sites of right breast in female, estrogen receptor positive ([place]) Followed by  [facility]... on  tamoxifen.  Osteoporosis ON boniva    Eliezer Lofts, MD

## 2019-02-01 NOTE — Assessment & Plan Note (Addendum)
Followed by  [facility]... on tamoxifen.

## 2019-02-01 NOTE — Assessment & Plan Note (Signed)
Well controlled. Continue current medication.  

## 2019-02-01 NOTE — Patient Instructions (Addendum)
Make nurse visit for shingles vaccine.

## 2019-02-04 ENCOUNTER — Telehealth (HOSPITAL_COMMUNITY): Payer: Self-pay | Admitting: Emergency Medicine

## 2019-02-04 NOTE — Telephone Encounter (Signed)
Reaching out to patient to offer assistance regarding upcoming cardiac imaging study; pt verbalizes understanding of appt date/time, parking situation and where to check in, pre-test NPO status and medications ordered, and verified current allergies; name and call back number provided for further questions should they arise Marchia Bond RN Navigator Cardiac Imaging [facility] [phone] office [phone] cell  Pt with zio patch on chest

## 2019-02-05 ENCOUNTER — Ambulatory Visit (HOSPITAL_COMMUNITY)
Admission: RE | Admit: 2019-02-05 | Discharge: 2019-02-05 | Disposition: A | Discharge status: Home / Self Care | Payer: BC Managed Care – PPO | Source: Ambulatory Visit | Attending: Nurse Practitioner | Admitting: Nurse Practitioner

## 2019-02-05 ENCOUNTER — Other Ambulatory Visit: Payer: Self-pay

## 2019-02-05 DIAGNOSIS — I259 Chronic ischemic heart disease, unspecified: Secondary | ICD-10-CM

## 2019-02-05 MED ORDER — NITROGLYCERIN 0.4 MG SL SUBL
0.8000 mg | SUBLINGUAL_TABLET | Freq: Once | SUBLINGUAL | Status: AC
  Administered 2019-02-05: 0.8 mg via SUBLINGUAL

## 2019-02-05 MED ORDER — IOHEXOL 350 MG/ML SOLN
80.0000 mL | Freq: Once | INTRAVENOUS | Status: AC | PRN
  Administered 2019-02-05: 13:00:00 80 mL via INTRAVENOUS

## 2019-02-05 MED ORDER — METOPROLOL TARTRATE 5 MG/5ML IV SOLN: 5.0000 mg | INTRAVENOUS | Status: DC | PRN

## 2019-02-05 MED ORDER — NITROGLYCERIN 0.4 MG SL SUBL
SUBLINGUAL_TABLET | SUBLINGUAL | Status: AC
  Filled 2019-02-05: qty 2

## 2019-02-06 DIAGNOSIS — I259 Chronic ischemic heart disease, unspecified: Secondary | ICD-10-CM | POA: Diagnosis not present

## 2019-02-07 ENCOUNTER — Other Ambulatory Visit: Payer: Self-pay

## 2019-02-07 ENCOUNTER — Ambulatory Visit
Admission: RE | Admit: 2019-02-07 | Discharge: 2019-02-07 | Disposition: A | Discharge status: Home / Self Care | Payer: BC Managed Care – PPO | Source: Ambulatory Visit | Attending: Family Medicine | Admitting: Family Medicine

## 2019-02-07 DIAGNOSIS — Z1231 Encounter for screening mammogram for malignant neoplasm of breast: Secondary | ICD-10-CM

## 2019-02-08 ENCOUNTER — Telehealth: Payer: Self-pay | Admitting: Medical

## 2019-02-08 NOTE — Telephone Encounter (Signed)
ph

## 2019-02-08 NOTE — Progress Notes (Signed)
No critical labs need to be addressed urgently. We will discuss labs in detail at upcoming office visit.   

## 2019-02-08 NOTE — Telephone Encounter (Signed)
   Was paged to call back [employer] who reported 10 beats of Vtach at 2:40 pm, peak rate at 151 bpm. Called patient and she reported she felt palpitations, but denied dizziness, lightheadedness, or syncope. LV function is normal by recent echo and normal coronaries by CTA. Recent labs unremarkable. Advised if symptoms persist or worsen (such as pre-syncope and syncope) to be evaluated in the ED right away. Otherwise, continue with follow up in the office Wednesday, 26th.  Caller verbalized understanding and was grateful for the call back.  Kenyetta Fife Ninfa Meeker, PA 02/08/2019, 5:56 PM

## 2019-02-08 NOTE — Progress Notes (Addendum)
CARDIOLOGY OFFICE NOTE  Date:  02/13/2019    Lisa Salinas Date of Birth: [birth year] Medical Record [id number]  PCP:  Jinny Sanders, MD  Cardiologist:  Servando Snare    Chief Complaint  Patient presents with  . Follow-up    History of Present Illness: Lisa Salinas is a 63 y.o. female who presents today for a pre cath visit.   She has a history of recurrent breast cancer - first found in 2006 - with surgery/chemo/XRT and medical therapy. Has had recurrence in 2018 - on tamoxifen.   She was seen in September by Dr. Jana Hakim - endorsed palpitations and dizziness. Husband had had COVID back in June - she herself tested negative. He referred her to me.   I saw her earlier this month - lots of symptoms (chest pain/dizziness/palpitations) noted with worrisome FH for early CAD (mom with stent in her 43's). We placed a monitor, got an echo and referred her for coronary CT. Coronary CT is abnormal. There has been a short run of VT noted on her monitor. She is now referred for cardiac cath.   The patient does not have symptoms concerning for COVID-19 infection (fever, chills, cough, or new shortness of breath).   Comes in today. Here with   Past Medical History:  Diagnosis Date  . Anxiety state, unspecified   . Basal cell carcinoma (BCC) of face   . Breast cancer, right breast ([place]) 10/20/2004   S/P lumpectomy (04/2005) and chemo (03/2005)  . Depression   . Dizziness and giddiness   . Dysrhythmia   . Family history of breast cancer   . Personal history of chemotherapy 03/2005  . Personal history of radiation therapy 05/2005    Past Surgical History:  Procedure Laterality Date  . ABDOMINAL HYSTERECTOMY     "left my ovaries; no cancer"  . BASAL CELL CARCINOMA EXCISION Right    side of face   . BREAST BIOPSY Bilateral    "2 on the left; 1 on the right; all benign"  . BREAST BIOPSY Right 10/20/2004   malignant  . BREAST EXCISIONAL BIOPSY Left   . BREAST LUMPECTOMY  Right 04/2005  . COLONOSCOPY    . MASTECTOMY COMPLETE / SIMPLE Right 10/17/2016  . MASTECTOMY W/ SENTINEL NODE BIOPSY Right 10/17/2016   Procedure: RIGHT MASTECTOMY WITH SENTINEL LYMPH NODE BIOPSY;  Surgeon: Fanny Skates, MD;  Location: [facility];  Service: General;  Laterality: Right;  . TONSILLECTOMY  Childhood     Medications: Current Meds  Medication Sig  . calcium citrate-vitamin D (CITRACAL+D) 315-200 MG-UNIT per tablet Take 1 tablet by mouth 2 (two) times daily.    Marland Kitchen ibandronate (BONIVA) 150 MG tablet TAKE 1 TABLET BY MOUTH EVERY 30 DAYS. TAKE IN THE MORNING WITH A FULL GLASS OF WATER ON EMPTY STOMACH. DO NOT TAKE ANYTHING ELSE BY MOUTH OR LIE DOWN FOR THE NEXT 30 MINUTES  . sertraline (ZOLOFT) 100 MG tablet TAKE 1 TABLET BY MOUTH EVERY DAY  . tamoxifen (NOLVADEX) 20 MG tablet TAKE 1 TABLET BY MOUTH EVERY DAY  . traZODone (DESYREL) 50 MG tablet Take 0.5-1 tablets (25-50 mg total) by mouth at bedtime as needed for sleep.     Allergies: Allergies  Allergen Reactions  . Sulfonamide Derivatives Rash    REACTION: [place]ps    Social History: The patient  reports that she has never smoked. She has never used smokeless tobacco. She reports that she does not drink alcohol or use drugs.  Family History: The patient's family history includes Breast cancer (age of onset: 74) in her maternal aunt; Breast cancer (age of onset: 64) in her maternal aunt; Breast cancer (age of onset: 64) in her cousin; Breast cancer (age of onset: 23) in her paternal aunt; Cancer (age of onset: 70) in her father; Heart disease in her mother; Hyperlipidemia in her mother; Hypertension in her mother.   Review of Systems: Please see the history of present illness.   All other systems are reviewed and negative.   Physical Exam: VS:  BP 118/68   Pulse 77   Ht 5\' 5"  (1.651 m)   Wt 170 lb 12.8 oz (77.5 kg)   SpO2 97%   BMI 28.42 kg/m  .  BMI Body mass index is 28.42 kg/m.  Wt Readings from Last 3 Encounters:   02/13/19 170 lb 12.8 oz (77.5 kg)  02/01/19 170 lb 4 oz (77.2 kg)  01/21/19 170 lb 12.8 oz (77.5 kg)    General: Pleasant. Well developed, well nourished and in no acute distress.   HEENT: Normal.  Neck: Supple, no JVD, carotid bruits, or masses noted.  Cardiac: Regular rate and rhythm. No murmurs, rubs, or gallops. No edema.  Respiratory:  Lungs are clear to auscultation bilaterally with normal work of breathing.  GI: Soft and nontender.  MS: No deformity or atrophy. Gait and ROM intact.  Skin: Warm and dry. Color is normal.  Neuro:  Strength and sensation are intact and no gross focal deficits noted.  Psych: Alert, appropriate and with normal affect.   LABORATORY DATA:  EKG:  EKG is ordered today. This demonstrates NSR.  Lab Results  Component Value Date   WBC 6.4 12/31/2018   HGB 13.6 12/31/2018   HCT 41.9 12/31/2018   PLT 168 12/31/2018   GLUCOSE 84 01/28/2019   CHOL 163 01/28/2019   TRIG 61.0 01/28/2019   HDL 51.30 01/28/2019   LDLCALC 100 (H) 01/28/2019   ALT 11 01/28/2019   AST 16 01/28/2019   NA 141 01/28/2019   K 4.0 01/28/2019   CL 105 01/28/2019   CREATININE 0.69 01/28/2019   BUN 14 01/28/2019   CO2 30 01/28/2019   TSH 1.010 01/21/2019     BNP (last 3 results) No results for input(s): BNP in the last 8760 hours.     Other Studies Reviewed Today:  Coronary CT 01/2019 IMPRESSION: 1. Coronary artery calcium score 145 Agatston units. This places the patient in the [place] percentile for age and gender, suggesting high risk for future cardiac events.  2. Extensive proximal LAD calcified plaque, suspect mild (<50%) stenosis.  3. Narrowed proximal LCx,?artifact versus soft plaque. If plaque, stenosis appears to be in the 50% range (not critical).  I will send the study for FFR.  [name]  FFR FINDINGS: FFR 0.87 distal LAD  FFR 0.89 distal RCA  FFR 0.75 distal LCx  IMPRESSION: There does appear to be a hemodynamically  significant mid LCx stenosis. Suspect soft plaque in this area based on CT angiogram.  [name]   Electronically Signed   By: [name] M.D.   [facility] 01/2019   1. Left ventricular ejection fraction, by visual estimation, is 55 to 60%. The left ventricle has normal function. Normal left ventricular size. Left ventricular septal wall thickness was mildly increased. Mildly increased left ventricular posterior  wall thickness. There is mildly increased left ventricular hypertrophy.  2. Left ventricular diastolic Doppler parameters are indeterminate pattern of LV diastolic filling.  3. Global right ventricle has normal systolic function.The right ventricular size is normal. No increase in right ventricular wall thickness.  4. Left atrial size was normal.  5. Right atrial size was normal.  6. The mitral valve is normal in structure. Trace mitral valve regurgitation.  7. The tricuspid valve is normal in structure. Tricuspid valve regurgitation is trivial.  8. The aortic valve is tricuspid Aortic valve regurgitation was not visualized by color flow Doppler. Structurally normal aortic valve, with no evidence of sclerosis or stenosis.  9. The pulmonic valve was grossly normal. Pulmonic valve regurgitation is not visualized by color flow Doppler. 10. Normal pulmonary artery systolic pressure. 11. The inferior vena cava is normal in size with greater than 50% respiratory variability, suggesting right atrial pressure of 3 mmHg.    Assessment/Plan:  1. Chest pain with exertion/dizziness/presyncope/palpitations - with a +FH for early CAD  (mother with stent in her late 25's and also has AF). Cardiac CT with CAD noted and hemodynamically significant lesion. Starting baby aspirin today. Starting Lipitor 10 mg a day. Adding Toprol 12.5 daily. Will arrange for cardiac cath. She has a new grand baby coming tomorrow and would like to proceed later next month. The patient understands that risks  include but are not limited to stroke (1 in 1000), death (1 in 57), kidney failure [usually temporary] (1 in 500), bleeding (1 in 200), allergic reaction [possibly serious] (1 in 200), and agrees to proceed. Arranged for Tuesday, November 10th with Dr. Tamala Julian - first case. COVID testing next Thursday and will then quarantine until her procedure. Lab today.   2. HLD - LDL is 100 - starting statin today.   3. Recurrent breast cancer - per Dr. Jana Hakim.   4. Palpitations - has had NSVT on her monitor - starting low dose beta blocker and arranging cath.   5. COVID-19 Education: The signs and symptoms of COVID-19 were discussed with the patient and how to seek care for testing (follow up with PCP or arrange E-visit).  The importance of social distancing, staying at home, hand hygiene and wearing a mask when out in public were discussed today.  Current medicines are reviewed with the patient today.  The patient does not have concerns regarding medicines other than what has been noted above.  The following changes have been made:  See above.  Labs/ tests ordered today include:    Orders Placed This Encounter  Procedures  . Comprehensive metabolic panel  . CBC with Differential/Platelet  . EKG 12-Lead     Disposition:   FU with me a few weeks post cath. Will need follow up labs that day as well.   Patient is agreeable to this plan and will call if any problems develop in the interim.   SignedTruitt Merle, NP  02/13/2019 2:32 PM  [facility] [address] [place],   [address] Phone: [phone] Fax: [phone]       Addendum: 02/26/19 - Regarding Cardiac Cath findings  Cecille Rubin,  The patient has borderline significant mid circumflex segmental stenosis. Her symptoms are atypical and include spontaneous episodes of dizziness, spontaneous episodes of right chest discomfort that have been going on since partial mastectomy in 2018,  and dyspnea on exertion (but she admits to physical deconditioning). In looking at all the data I have decided not to perform stent implantation because I was not convinced that her symptoms will improve. I would recommend aggressive lifestyle changes and risk  factor modification. Start high intensity statin therapy and drive LDL less than 55. Enroll in phase 2 cardiac rehab. Follow closely. If she develops exertional symptoms or hits a wall in rehab in terms of improving dyspnea on exertion, consider stenting the circumflex.   HS

## 2019-02-08 NOTE — H&P (View-Only) (Signed)
CARDIOLOGY OFFICE NOTE  Date:  02/13/2019    Lisa Salinas Date of Birth: 27-Jul-1955 Medical Record [id number]  PCP:  Lisa Sanders, MD  Cardiologist:  Lisa Salinas    Chief Complaint  Patient presents with   Follow-up    History of Present Illness: Lisa Salinas is a 63 y.o. female who presents today for a pre cath visit.   She has a history of recurrent breast cancer - first found in 2006 - with surgery/chemo/XRT and medical therapy. Has had recurrence in 2018 - on tamoxifen.   She was seen in September by Dr. Jana Salinas - endorsed palpitations and dizziness. Husband had had COVID back in June - she herself tested negative. He referred her to me.   I saw her earlier this month - lots of symptoms (chest pain/dizziness/palpitations) noted with worrisome FH for early CAD (mom with stent in her 58's). We placed a monitor, got an echo and referred her for coronary CT. Coronary CT is abnormal. There has been a short run of VT noted on her monitor. She is now referred for cardiac cath.   The patient does not have symptoms concerning for COVID-19 infection (fever, chills, cough, or new shortness of breath).   Comes in today. Here with   Past Medical History:  Diagnosis Date   Anxiety state, unspecified    Basal cell carcinoma (BCC) of face    Breast cancer, right breast ([facility]) 10/20/2004   S/P lumpectomy (04/2005) and chemo (03/2005)   Depression    Dizziness and giddiness    Dysrhythmia    Family history of breast cancer    Personal history of chemotherapy 03/2005   Personal history of radiation therapy 05/2005    Past Surgical History:  Procedure Laterality Date   ABDOMINAL HYSTERECTOMY     "left my ovaries; no cancer"   BASAL CELL CARCINOMA EXCISION Right    side of face    BREAST BIOPSY Bilateral    "2 on the left; 1 on the right; all benign"   BREAST BIOPSY Right 10/20/2004   malignant   BREAST EXCISIONAL BIOPSY Left    BREAST LUMPECTOMY  Right 04/2005   COLONOSCOPY     MASTECTOMY COMPLETE / SIMPLE Right 10/17/2016   MASTECTOMY W/ SENTINEL NODE BIOPSY Right 10/17/2016   Procedure: RIGHT MASTECTOMY WITH SENTINEL LYMPH NODE BIOPSY;  Surgeon: Lisa Skates, MD;  Location: [facility];  Service: General;  Laterality: Right;   TONSILLECTOMY  Childhood     Medications: Current Meds  Medication Sig   calcium citrate-vitamin D (CITRACAL+D) 315-200 MG-UNIT per tablet Take 1 tablet by mouth 2 (two) times daily.     ibandronate (BONIVA) 150 MG tablet TAKE 1 TABLET BY MOUTH EVERY 30 DAYS. TAKE IN THE MORNING WITH A FULL GLASS OF WATER ON EMPTY STOMACH. DO NOT TAKE ANYTHING ELSE BY MOUTH OR LIE DOWN FOR THE NEXT 30 MINUTES   sertraline (ZOLOFT) 100 MG tablet TAKE 1 TABLET BY MOUTH EVERY DAY   tamoxifen (NOLVADEX) 20 MG tablet TAKE 1 TABLET BY MOUTH EVERY DAY   traZODone (DESYREL) 50 MG tablet Take 0.5-1 tablets (25-50 mg total) by mouth at bedtime as needed for sleep.     Allergies: Allergies  Allergen Reactions   Sulfonamide Derivatives Rash    REACTION: [place]helps    Social History: The patient  reports that she has never smoked. She has never used smokeless tobacco. She reports that she does not drink alcohol or use drugs.  Family History: The patient's family history includes Breast cancer (age of onset: 55) in her maternal aunt; Breast cancer (age of onset: 26) in her maternal aunt; Breast cancer (age of onset: 50) in her cousin; Breast cancer (age of onset: 29) in her paternal aunt; Cancer (age of onset: 23) in her father; Heart disease in her mother; Hyperlipidemia in her mother; Hypertension in her mother.   Review of Systems: Please see the history of present illness.   All other systems are reviewed and negative.   Physical Exam: VS:  BP 118/68    Pulse 77    Ht 5\' 5"  (1.651 m)    Wt 170 lb 12.8 oz (77.5 kg)    SpO2 97%    BMI 28.42 kg/m  .  BMI Body mass index is 28.42 kg/m.  Wt Readings from Last 3 Encounters:   02/13/19 170 lb 12.8 oz (77.5 kg)  02/01/19 170 lb 4 oz (77.2 kg)  01/21/19 170 lb 12.8 oz (77.5 kg)    General: Pleasant. Well developed, well nourished and in no acute distress.   HEENT: Normal.  Neck: Supple, no JVD, carotid bruits, or masses noted.  Cardiac: Regular rate and rhythm. No murmurs, rubs, or gallops. No edema.  Respiratory:  Lungs are clear to auscultation bilaterally with normal work of breathing.  GI: Soft and nontender.  MS: No deformity or atrophy. Gait and ROM intact.  Skin: Warm and dry. Color is normal.  Neuro:  Strength and sensation are intact and no gross focal deficits noted.  Psych: Alert, appropriate and with normal affect.   LABORATORY DATA:  EKG:  EKG is ordered today. This demonstrates NSR.  Lab Results  Component Value Date   WBC 6.4 12/31/2018   HGB 13.6 12/31/2018   HCT 41.9 12/31/2018   PLT 168 12/31/2018   GLUCOSE 84 01/28/2019   CHOL 163 01/28/2019   TRIG 61.0 01/28/2019   HDL 51.30 01/28/2019   LDLCALC 100 (H) 01/28/2019   ALT 11 01/28/2019   AST 16 01/28/2019   NA 141 01/28/2019   K 4.0 01/28/2019   CL 105 01/28/2019   CREATININE 0.69 01/28/2019   BUN 14 01/28/2019   CO2 30 01/28/2019   TSH 1.010 01/21/2019     BNP (last 3 results) No results for input(s): BNP in the last 8760 hours.     Other Studies Reviewed Today:  Coronary CT 01/2019 IMPRESSION: 1. Coronary artery calcium score 145 Agatston units. This places the patient in the [place] percentile for age and gender, suggesting high risk for future cardiac events.  2. Extensive proximal LAD calcified plaque, suspect mild (<50%) stenosis.  3. Narrowed proximal LCx,?artifact versus soft plaque. If plaque, stenosis appears to be in the 50% range (not critical).  I will send the study for FFR.  Lisa Salinas  FFR FINDINGS: FFR 0.87 distal LAD  FFR 0.89 distal RCA  FFR 0.75 distal LCx  IMPRESSION: There does appear to be a hemodynamically  significant mid LCx stenosis. Suspect soft plaque in this area based on CT angiogram.  Lisa Salinas   Electronically Signed   By: Lisa Salinas M.D.   [facility] 01/2019   1. Left ventricular ejection fraction, by visual estimation, is 55 to 60%. The left ventricle has normal function. Normal left ventricular size. Left ventricular septal wall thickness was mildly increased. Mildly increased left ventricular posterior  wall thickness. There is mildly increased left ventricular hypertrophy.  2. Left ventricular diastolic Doppler parameters are indeterminate pattern  of LV diastolic filling.  3. Global right ventricle has normal systolic function.The right ventricular size is normal. No increase in right ventricular wall thickness.  4. Left atrial size was normal.  5. Right atrial size was normal.  6. The mitral valve is normal in structure. Trace mitral valve regurgitation.  7. The tricuspid valve is normal in structure. Tricuspid valve regurgitation is trivial.  8. The aortic valve is tricuspid Aortic valve regurgitation was not visualized by color flow Doppler. Structurally normal aortic valve, with no evidence of sclerosis or stenosis.  9. The pulmonic valve was grossly normal. Pulmonic valve regurgitation is not visualized by color flow Doppler. 10. Normal pulmonary artery systolic pressure. 11. The inferior vena cava is normal in size with greater than 50% respiratory variability, suggesting right atrial pressure of 3 mmHg.    Assessment/Plan:  1. Chest pain with exertion/dizziness/presyncope/palpitations - with a +FH for early CAD  (mother with stent in her late 35's and also has AF). Cardiac CT with CAD noted and hemodynamically significant lesion. Starting baby aspirin today. Starting Lipitor 10 mg a day. Adding Toprol 12.5 daily. Will arrange for cardiac cath. She has a new grand baby coming tomorrow and would like to proceed later next month. The patient understands that risks  include but are not limited to stroke (1 in 1000), death (1 in 37), kidney failure [usually temporary] (1 in 500), bleeding (1 in 200), allergic reaction [possibly serious] (1 in 200), and agrees to proceed. Arranged for Tuesday, November 10th with Dr. Tamala Julian - first case. COVID testing next Thursday and will then quarantine until her procedure. Lab today.   2. HLD - LDL is 100 - starting statin today.   3. Recurrent breast cancer - per Dr. Jana Salinas.   4. Palpitations - has had NSVT on her monitor - starting low dose beta blocker and arranging cath.   5. COVID-19 Education: The signs and symptoms of COVID-19 were discussed with the patient and how to seek care for testing (follow up with PCP or arrange E-visit).  The importance of social distancing, staying at home, hand hygiene and wearing a mask when out in public were discussed today.  Current medicines are reviewed with the patient today.  The patient does not have concerns regarding medicines other than what has been noted above.  The following changes have been made:  See above.  Labs/ tests ordered today include:    Orders Placed This Encounter  Procedures   Comprehensive metabolic panel   CBC with Differential/Platelet   EKG 12-Lead     Disposition:   FU with me a few weeks post cath. Will need follow up labs that day as well.   Patient is agreeable to this plan and will call if any problems develop in the interim.   SignedTruitt Merle, NP  02/13/2019 2:32 PM  [facility] [address] [place], [place]  [address] Phone: [phone] Fax: [phone]

## 2019-02-11 ENCOUNTER — Telehealth: Payer: Self-pay | Admitting: *Deleted

## 2019-02-11 NOTE — Telephone Encounter (Signed)
Spoke with pt re critcal event from [employer] 02-08-19 SINUS RHYTHM W/RUN OF V-TACH ( 10 BEATS) Per pt was sitting down and felt fluttering and noted r sided neck pain no dizziness.Pt has appt with Truitt Merle NP on 02-13-19 at 2:00 pm to discuss abn cardiac CT and possibly scheduling a heart cath .Per Dr Burt Knack reviewed and notes SVT with Aberrancy  No changes at this time continue to monitor ./cy

## 2019-02-13 ENCOUNTER — Other Ambulatory Visit: Payer: Self-pay | Admitting: Nurse Practitioner

## 2019-02-13 ENCOUNTER — Encounter: Payer: Self-pay | Admitting: Nurse Practitioner

## 2019-02-13 ENCOUNTER — Ambulatory Visit: Payer: BC Managed Care – PPO | Admitting: Nurse Practitioner

## 2019-02-13 ENCOUNTER — Other Ambulatory Visit: Payer: Self-pay

## 2019-02-13 VITALS — BP 118/68 | HR 77 | Ht 65.0 in | Wt 170.8 lb

## 2019-02-13 DIAGNOSIS — Z8249 Family history of ischemic heart disease and other diseases of the circulatory system: Secondary | ICD-10-CM

## 2019-02-13 DIAGNOSIS — R079 Chest pain, unspecified: Secondary | ICD-10-CM | POA: Diagnosis not present

## 2019-02-13 DIAGNOSIS — Z7189 Other specified counseling: Secondary | ICD-10-CM | POA: Diagnosis not present

## 2019-02-13 MED ORDER — ASPIRIN EC 81 MG PO TBEC: 81.0000 mg | DELAYED_RELEASE_TABLET | Freq: Every day | ORAL | 3 refills | Status: AC

## 2019-02-13 MED ORDER — METOPROLOL SUCCINATE ER 25 MG PO TB24: 12.5000 mg | ORAL_TABLET | Freq: Every day | ORAL | 6 refills | Status: DC

## 2019-02-13 MED ORDER — ATORVASTATIN CALCIUM 10 MG PO TABS: 10.0000 mg | ORAL_TABLET | Freq: Every day | ORAL | 3 refills | Status: DC

## 2019-02-13 NOTE — Patient Instructions (Addendum)
After Visit Summary:  We will be checking the following labs today - CMET and CBC   Medication Instructions:    Continue with your current medicines. BUT  I am adding baby aspirin 81 mg daily  Lipitor 10 mg a day  Toprol XL 25 mg - take 1/2 tablet (12.5mg ) daily   If you need a refill on your cardiac medications before your next appointment, please call your pharmacy.     Testing/Procedures To Be Arranged:  Cardiac catheterization  Follow-Up:   See me a few weeks after your cath with fasting labs on return    At [facility], you and your health needs are our priority.  As part of our continuing mission to provide you with exceptional heart care, we have created designated Provider Care Teams.  These Care Teams include your primary Cardiologist (physician) and Advanced Practice Providers (APPs -  Physician Assistants and Nurse Practitioners) who all work together to provide you with the care you need, when you need it.  Special Instructions:  . Stay safe, stay home, wash your hands for at least 20 seconds and wear a mask when out in public.  . It was good to talk with you today.   Your provider has recommended a cardiac catherization.   COVID SCREENING INFORMATION: You are scheduled for your COVID screening on Thursday, November 5th at _________________________. [facility] ([facility]) [address] Stay in the RIGHT lane and proceed under the brick awning (NOT the tent) and tell them you are there for pre-procedure testing. Do NOT bring any pets with you to the testing site.  You have to do this before 3:15 PM on the day chosen.   You are scheduled for a cardiac catheterization on Tuesday, November 10th at 7:30 AM with Dr. Tamala Julian or associate.  Please arrive at the [facility] (Main Entrance) at [facility] at [place] Stay on Tuesday, November 10th at [place] parking is  available.   You are allowed only one visitor in the hospital with you. Both you and your visitor must wear masks.    Special note: Every effort is made to have your procedure done on time.   Please understand that emergencies sometimes delay a scheduled   procedure.  No food or drink after midnight on Monday November 9th.  On the morning of your procedure, take your aspirin.   You may take your morning medications with a sip of water on the day of your procedure.  Please take a baby aspirin (81 mg) on the morning of your procedure.   Medications to [place] for a one night stay -- bring personal belongings.  Bring a current list of your medications and current insurance cards.  You MUST have a responsible person to drive you home. Someone MUST be with you the first 24 hours after you arrive home or your discharge will be delayed. Wear clothes that are easy to get on and off and wear slip on shoes.    Coronary Angiogram A coronary angiogram, also called coronary angiography, is an X-ray procedure used to look at the arteries in the heart. In this procedure, a dye (contrast dye) is injected through a long, hollow tube (catheter). The catheter is about the size of a piece of cooked spaghetti and is inserted through your groin, wrist, or arm. The dye is injected into each artery, and  X-rays are then taken to show if there is a blockage in the arteries of your heart.  LET [facility] CARE PROVIDER KNOW ABOUT:  Any allergies you have, including allergies to shellfish or contrast dye.    All medicines you are taking, including vitamins, herbs, eye drops, creams, and over-the-counter medicines.    Previous problems you or members of your family have had with the use of anesthetics.    Any blood disorders you have.    Previous surgeries you have had.  History of kidney problems or failure.    Other medical conditions you have.  RISKS AND COMPLICATIONS  Generally, a coronary  angiogram is a safe procedure. However, about 1 person out of 1000 can have problems that may include:  Allergic reaction to the dye.  Bleeding/bruising from the access site or other locations.  Kidney injury, especially in people with impaired kidney function.   Stroke (rare).  Heart attack (rare).  Irregular rhythms (rare)  Death (rare)  BEFORE THE PROCEDURE   Do not eat or drink anything after midnight the night before the procedure or as directed by your health care provider.    Ask your health care provider about changing or stopping your regular medicines. This is especially important if you are taking diabetes medicines or blood thinners.  PROCEDURE  You may be given a medicine to help you relax (sedative) before the procedure. This medicine is given through an intravenous (IV) access tube that is inserted into one of your veins.    The area where the catheter will be inserted will be washed and shaved. This is usually done in the groin but may be done in the fold of your arm (near your elbow) or in the wrist.     A medicine will be given to numb the area where the catheter will be inserted (local anesthetic).    The health care provider will insert the catheter into an artery. The catheter will be guided by using a special type of X-ray (fluoroscopy) of the blood vessel being examined.    A special dye will then be injected into the catheter, and X-rays will be taken. The dye will help to show where any narrowing or blockages are located in the heart arteries.     AFTER THE PROCEDURE   If the procedure is done through the leg, you will be kept in bed lying flat for several hours. You will be instructed to not bend or cross your legs.  The insertion site will be checked frequently.    The pulse in your feet or wrist will be checked frequently.    Additional blood tests, X-rays, and an electrocardiogram may be done.       Call the [place] office at [phone] if you have any questions, problems or concerns.

## 2019-02-14 ENCOUNTER — Telehealth: Payer: Self-pay | Admitting: Nurse Practitioner

## 2019-02-14 LAB — COMPREHENSIVE METABOLIC PANEL
ALT: 16 IU/L (ref 0–32)
AST: 17 IU/L (ref 0–40)
Albumin/Globulin Ratio: 2 (ref 1.2–2.2)
Albumin: 4.5 g/dL (ref 3.8–4.8)
Alkaline Phosphatase: 49 IU/L (ref 39–117)
BUN/Creatinine Ratio: 26 (ref 12–28)
BUN: 18 mg/dL (ref 8–27)
Bilirubin Total: 0.3 mg/dL (ref 0.0–1.2)
CO2: 27 mmol/L (ref 20–29)
Calcium: 9.4 mg/dL (ref 8.7–10.3)
Chloride: 101 mmol/L (ref 96–106)
Creatinine, Ser: 0.69 mg/dL (ref 0.57–1.00)
GFR calc Af Amer: 106 mL/min/{1.73_m2} (ref >59)
GFR calc non Af Amer: 92 mL/min/{1.73_m2} (ref >59)
Globulin, Total: 2.2 g/dL (ref 1.5–4.5)
Glucose: 80 mg/dL (ref 65–99)
Potassium: 3.9 mmol/L (ref 3.5–5.2)
Sodium: 139 mmol/L (ref 134–144)
Total Protein: 6.7 g/dL (ref 6.0–8.5)

## 2019-02-14 LAB — CBC WITH DIFFERENTIAL/PLATELET
Basophils Absolute: 0 10*3/uL (ref 0.0–0.2)
Basos: 1 %
EOS (ABSOLUTE): 0.2 10*3/uL (ref 0.0–0.4)
Eos: 3 %
Hematocrit: 40.5 % (ref 34.0–46.6)
Hemoglobin: 13.5 g/dL (ref 11.1–15.9)
Immature Grans (Abs): 0 10*3/uL (ref 0.0–0.1)
Immature Granulocytes: 0 %
Lymphocytes Absolute: 2.1 10*3/uL (ref 0.7–3.1)
Lymphs: 33 %
MCH: 30 pg (ref 26.6–33.0)
MCHC: 33.3 g/dL (ref 31.5–35.7)
MCV: 90 fL (ref 79–97)
Monocytes Absolute: 0.5 10*3/uL (ref 0.1–0.9)
Monocytes: 8 %
Neutrophils Absolute: 3.5 10*3/uL (ref 1.4–7.0)
Neutrophils: 55 %
Platelets: 203 10*3/uL (ref 150–450)
RBC: 4.5 x10E6/uL (ref 3.77–5.28)
RDW: 12.6 % (ref 11.7–15.4)
WBC: 6.4 10*3/uL (ref 3.4–10.8)

## 2019-02-14 NOTE — Telephone Encounter (Signed)
I spoke to the patient and her daughter who called because she was started on Lipitor 10 mg Daily and Metoprolol Succinate 12.5 mg Daily on 10/28 visit.  She took her morning medications and this afternoon she experienced "fluttering in her chest", weakness and dizziness. She laid down for about 45 minutes.  She got up and started feeling much better.  Her BP was 119/76, but never recorded HR.    She feels better now and will hold morning dose of Metoprolol and see how she feels midday.  She will update [place].

## 2019-02-14 NOTE — Telephone Encounter (Signed)
New Message     Pt c/o medication issue:  1. Name of Medication:  Lipitor, Toprol   2. How are you currently taking this medication (dosage and times per day)? 10 mg 1x daily and 25mg  1 x daily   3. Are you having a reaction (difficulty breathing--STAT)? No   4. What is your medication issue? BP was normal, pt feels funny, she felt fluttering feelings in her chest. No pain, she had to lay down. She also was pale in the face.   Pts daughter is calling with concern because the pt was not feeling right after taking her medications. She says she doesn't know which one could be making her feel this way.

## 2019-02-14 NOTE — Telephone Encounter (Signed)
Could try taking metoprolol at night.  Continue to monitor BP and HR if possible.  She should still have her monitor on as well.   Burtis Junes, RN, [place] [address] [place],   [id number] [phone]

## 2019-02-15 NOTE — Telephone Encounter (Signed)
I spoke with pt's daughter and gave her information from Truitt Merle, NP

## 2019-02-17 DIAGNOSIS — I2510 Atherosclerotic heart disease of native coronary artery without angina pectoris: Secondary | ICD-10-CM

## 2019-02-17 HISTORY — DX: Atherosclerotic heart disease of native coronary artery without angina pectoris: I25.10

## 2019-02-21 ENCOUNTER — Other Ambulatory Visit (HOSPITAL_COMMUNITY)
Admission: RE | Admit: 2019-02-21 | Discharge: 2019-02-21 | Disposition: A | Discharge status: Home / Self Care | Payer: BC Managed Care – PPO | Source: Ambulatory Visit | Attending: Interventional Cardiology | Admitting: Interventional Cardiology

## 2019-02-21 DIAGNOSIS — Z01812 Encounter for preprocedural laboratory examination: Secondary | ICD-10-CM | POA: Insufficient documentation

## 2019-02-21 DIAGNOSIS — Z20828 Contact with and (suspected) exposure to other viral communicable diseases: Secondary | ICD-10-CM | POA: Diagnosis not present

## 2019-02-23 LAB — NOVEL CORONAVIRUS, NAA (HOSP ORDER, SEND-OUT TO REF LAB; TAT 18-24 HRS): SARS-CoV-2, NAA: NOT DETECTED

## 2019-02-25 ENCOUNTER — Telehealth: Payer: Self-pay | Admitting: *Deleted

## 2019-02-25 NOTE — Telephone Encounter (Signed)
Pt contacted pre-catheterization scheduled at [facility] for: Tuesday February 26, 2019 7:30 AM Verified arrival time and place: [place] [facility]) at: 5:30 AM   No solid food after midnight prior to cath, clear liquids until 5 AM day of procedure. Contrast allergy: no   AM meds can be  taken pre-cath with sip of water including: ASA 81 mg   Confirmed patient has responsible adult to drive home post procedure and observe 24 hours after arriving home: yes  Currently, due to Covid-19 pandemic, only one support person will be allowed with patient. Must be the same support person for that patient's entire stay, will be screened and required to wear a mask. They will be asked to wait in the waiting room for the duration of the patient's stay.  Patients are required to wear a mask when they enter the hospital.      COVID-19 Pre-Screening Questions:  . In the past 7 to 10 days have you had a cough,  shortness of breath, headache, congestion, fever (100 or greater) body aches, chills, sore throat, or sudden loss of taste or sense of smell? no . Have you been around anyone with known Covid 19? no . Have you been around anyone who is awaiting Covid 19 test results in the past 7 to 10 days? no . Have you been around anyone who has been exposed to Covid 19, or has mentioned symptoms of Covid 19 within the past 7 to 10 days? no  I reviewed procedure/mask/visitor instructions, Covid-19 screening questions with patient, she verbalized understanding, thanked me for call.

## 2019-02-25 NOTE — H&P (Signed)
FFR CT suggest borderline significant circumflex stenosis in the mid vessel ending on a very small distal vessel.  Other territories are clear of obstructive disease. Having atypical chest pain/dizziness/palpitations. Monitor results are not yet available. Echo is still pending.

## 2019-02-26 ENCOUNTER — Encounter (HOSPITAL_COMMUNITY): Payer: Self-pay | Admitting: Interventional Cardiology

## 2019-02-26 ENCOUNTER — Ambulatory Visit (HOSPITAL_COMMUNITY)
Admission: RE | Admit: 2019-02-26 | Discharge: 2019-02-26 | Disposition: A | Discharge status: Home / Self Care | Payer: BC Managed Care – PPO | Attending: Interventional Cardiology | Admitting: Interventional Cardiology

## 2019-02-26 ENCOUNTER — Encounter (HOSPITAL_COMMUNITY)
Admission: RE | Disposition: A | Discharge status: Home / Self Care | Payer: Self-pay | Source: Home / Self Care | Attending: Interventional Cardiology

## 2019-02-26 DIAGNOSIS — Z79810 Long term (current) use of selective estrogen receptor modulators (SERMs): Secondary | ICD-10-CM | POA: Insufficient documentation

## 2019-02-26 DIAGNOSIS — Z79899 Other long term (current) drug therapy: Secondary | ICD-10-CM | POA: Diagnosis not present

## 2019-02-26 DIAGNOSIS — I2510 Atherosclerotic heart disease of native coronary artery without angina pectoris: Secondary | ICD-10-CM | POA: Diagnosis not present

## 2019-02-26 DIAGNOSIS — Z882 Allergy status to sulfonamides status: Secondary | ICD-10-CM | POA: Diagnosis not present

## 2019-02-26 DIAGNOSIS — Z8249 Family history of ischemic heart disease and other diseases of the circulatory system: Secondary | ICD-10-CM | POA: Insufficient documentation

## 2019-02-26 DIAGNOSIS — Z9011 Acquired absence of right breast and nipple: Secondary | ICD-10-CM | POA: Insufficient documentation

## 2019-02-26 DIAGNOSIS — R079 Chest pain, unspecified: Secondary | ICD-10-CM

## 2019-02-26 DIAGNOSIS — F411 Generalized anxiety disorder: Secondary | ICD-10-CM | POA: Diagnosis present

## 2019-02-26 HISTORY — PX: LEFT HEART CATH AND CORONARY ANGIOGRAPHY: CATH118249

## 2019-02-26 SURGERY — LEFT HEART CATH AND CORONARY ANGIOGRAPHY
Anesthesia: LOCAL

## 2019-02-26 MED ORDER — HYDRALAZINE HCL 20 MG/ML IJ SOLN: 10.0000 mg | INTRAMUSCULAR | Status: DC | PRN

## 2019-02-26 MED ORDER — NITROGLYCERIN 1 MG/10 ML FOR IR/CATH LAB
INTRA_ARTERIAL | Status: AC
  Filled 2019-02-26: qty 10

## 2019-02-26 MED ORDER — OXYCODONE HCL 5 MG PO TABS: 5.0000 mg | ORAL_TABLET | ORAL | Status: DC | PRN

## 2019-02-26 MED ORDER — VERAPAMIL HCL 2.5 MG/ML IV SOLN
INTRAVENOUS | Status: DC | PRN
  Administered 2019-02-26: 08:00:00 10 mL via INTRA_ARTERIAL

## 2019-02-26 MED ORDER — SODIUM CHLORIDE 0.9 % IV SOLN: 250.0000 mL | INTRAVENOUS | Status: DC | PRN

## 2019-02-26 MED ORDER — ACETAMINOPHEN 325 MG PO TABS: 650.0000 mg | ORAL_TABLET | ORAL | Status: DC | PRN

## 2019-02-26 MED ORDER — SODIUM CHLORIDE 0.9% FLUSH: 3.0000 mL | INTRAVENOUS | Status: DC | PRN

## 2019-02-26 MED ORDER — SODIUM CHLORIDE 0.9% FLUSH: 3.0000 mL | Freq: Two times a day (BID) | INTRAVENOUS | Status: DC

## 2019-02-26 MED ORDER — DIAZEPAM 5 MG PO TABS
10.0000 mg | ORAL_TABLET | ORAL | Status: AC
  Administered 2019-02-26: 06:00:00 10 mg via ORAL
  Filled 2019-02-26: qty 2

## 2019-02-26 MED ORDER — MIDAZOLAM HCL 2 MG/2ML IJ SOLN
INTRAMUSCULAR | Status: DC | PRN
  Administered 2019-02-26: 1 mg via INTRAVENOUS

## 2019-02-26 MED ORDER — SODIUM CHLORIDE 0.9 % IV SOLN: INTRAVENOUS | Status: DC

## 2019-02-26 MED ORDER — NITROGLYCERIN 1 MG/10 ML FOR IR/CATH LAB
INTRA_ARTERIAL | Status: DC | PRN
  Administered 2019-02-26: 200 ug via INTRACORONARY

## 2019-02-26 MED ORDER — LIDOCAINE HCL (PF) 1 % IJ SOLN
INTRAMUSCULAR | Status: DC | PRN
  Administered 2019-02-26: 2 mL

## 2019-02-26 MED ORDER — MIDAZOLAM HCL 2 MG/2ML IJ SOLN
INTRAMUSCULAR | Status: AC
  Filled 2019-02-26: qty 2

## 2019-02-26 MED ORDER — FENTANYL CITRATE (PF) 100 MCG/2ML IJ SOLN
INTRAMUSCULAR | Status: AC
  Filled 2019-02-26: qty 2

## 2019-02-26 MED ORDER — SODIUM CHLORIDE 0.9 % IV SOLN
INTRAVENOUS | Status: DC
  Administered 2019-02-26: 06:00:00 via INTRAVENOUS

## 2019-02-26 MED ORDER — VERAPAMIL HCL 2.5 MG/ML IV SOLN
INTRAVENOUS | Status: AC
  Filled 2019-02-26: qty 2

## 2019-02-26 MED ORDER — ASPIRIN 81 MG PO CHEW: 81.0000 mg | CHEWABLE_TABLET | Freq: Every day | ORAL | Status: DC

## 2019-02-26 MED ORDER — ONDANSETRON HCL 4 MG/2ML IJ SOLN: 4.0000 mg | Freq: Four times a day (QID) | INTRAMUSCULAR | Status: DC | PRN

## 2019-02-26 MED ORDER — ASPIRIN 81 MG PO CHEW: 81.0000 mg | CHEWABLE_TABLET | ORAL | Status: AC

## 2019-02-26 MED ORDER — HEPARIN SODIUM (PORCINE) 1000 UNIT/ML IJ SOLN
INTRAMUSCULAR | Status: DC | PRN
  Administered 2019-02-26: 4000 [IU] via INTRAVENOUS

## 2019-02-26 MED ORDER — HEPARIN SODIUM (PORCINE) 1000 UNIT/ML IJ SOLN
INTRAMUSCULAR | Status: AC
  Filled 2019-02-26: qty 1

## 2019-02-26 MED ORDER — HEPARIN (PORCINE) IN NACL 1000-0.9 UT/500ML-% IV SOLN
INTRAVENOUS | Status: AC
  Filled 2019-02-26: qty 1000

## 2019-02-26 MED ORDER — FENTANYL CITRATE (PF) 100 MCG/2ML IJ SOLN
INTRAMUSCULAR | Status: DC | PRN
  Administered 2019-02-26: 25 ug via INTRAVENOUS

## 2019-02-26 MED ORDER — IOHEXOL 350 MG/ML SOLN
INTRAVENOUS | Status: DC | PRN
  Administered 2019-02-26: 08:00:00 80 mL

## 2019-02-26 MED ORDER — LABETALOL HCL 5 MG/ML IV SOLN: 10.0000 mg | INTRAVENOUS | Status: DC | PRN

## 2019-02-26 MED ORDER — HEPARIN (PORCINE) IN NACL 1000-0.9 UT/500ML-% IV SOLN
INTRAVENOUS | Status: DC | PRN
  Administered 2019-02-26 (×2): 500 mL

## 2019-02-26 MED ORDER — LIDOCAINE HCL (PF) 1 % IJ SOLN
INTRAMUSCULAR | Status: AC
  Filled 2019-02-26: qty 30

## 2019-02-26 SURGICAL SUPPLY — 10 items
CATH 5FR JL3.5 JR4 ANG PIG MP (CATHETERS) ×1 IMPLANT
DEVICE RAD COMP TR BAND LRG (VASCULAR PRODUCTS) ×1 IMPLANT
GLIDESHEATH SLEND A-KIT 6F 22G (SHEATH) ×1 IMPLANT
GUIDEWIRE INQWIRE 1.5J.035X260 (WIRE) IMPLANT
INQWIRE 1.5J .035X260CM (WIRE) ×2
KIT HEART LEFT (KITS) ×2 IMPLANT
PACK CARDIAC CATHETERIZATION (CUSTOM PROCEDURE TRAY) ×2 IMPLANT
SHEATH PROBE COVER 6X72 (BAG) ×1 IMPLANT
TRANSDUCER W/STOPCOCK (MISCELLANEOUS) ×2 IMPLANT
TUBING CIL FLEX 10 FLL-RA (TUBING) ×2 IMPLANT

## 2019-02-26 NOTE — Interval H&P Note (Signed)
Cath Lab Visit (complete for each Cath Lab visit)  Clinical Evaluation Leading to the Procedure:   ACS: No.  Non-ACS:    Anginal Classification: CCS III  Anti-ischemic medical therapy: Maximal Therapy (2 or more classes of medications)  Non-Invasive Test Results: No non-invasive testing performed  Prior CABG: No previous CABG      History and Physical Interval Note:  02/26/2019 7:29 AM  Lisa Salinas  has presented today for surgery, with the diagnosis of abnormal ct - cad.  The various methods of treatment have been discussed with the patient and family. After consideration of risks, benefits and other options for treatment, the patient has consented to  Procedure(s): LEFT HEART CATH AND CORONARY ANGIOGRAPHY (N/A) as a surgical intervention.  The patient's history has been reviewed, patient examined, no change in status, stable for surgery.  I have reviewed the patient's chart and labs.  Questions were answered to the patient's satisfaction.     Belva Crome III

## 2019-02-26 NOTE — CV Procedure (Signed)
   Coronary angiography via right radial using real-time vascular ultrasound for guidance.  Mid circumflex segmental 50 to 60% stenosis noted.  FFR noted to have borderline abnormal hemodynamics.  Proximal to mid LAD eccentric 30% narrowing.  Left main and right coronary are normal.  Symptoms are atypical and include: Dizziness, dyspnea, and right chest pressure.  All occur spontaneously and are not associated.  Recommend aggressive lipid-lowering, risk factor modification, cardiac rehab, weight loss, and close medical follow-up.

## 2019-02-26 NOTE — Progress Notes (Signed)
Discharge instructions reviewed with patient and daughter. Verbalized understanding.  

## 2019-02-26 NOTE — Discharge Instructions (Signed)
Radial Site Care ° °This sheet gives you information about how to care for yourself after your procedure. Your health care provider may also give you more specific instructions. If you have problems or questions, contact your health care provider. °What can I expect after the procedure? °After the procedure, it is common to have: °· Bruising and tenderness at the catheter insertion area. °Follow these instructions at home: °Medicines °· Take over-the-counter and prescription medicines only as told by your health care provider. °Insertion site care °· Follow instructions from your health care provider about how to take care of your insertion site. Make sure you: °? Wash your hands with soap and water before you change your bandage (dressing). If soap and water are not available, use hand sanitizer. °? Change your dressing as told by your health care provider. °? Leave stitches (sutures), skin glue, or adhesive strips in place. These skin closures may need to stay in place for 2 weeks or longer. If adhesive strip edges start to loosen and curl up, you may trim the loose edges. Do not remove adhesive strips completely unless your health care provider tells you to do that. °· Check your insertion site every day for signs of infection. Check for: °? Redness, swelling, or pain. °? Fluid or blood. °? Pus or a bad smell. °? Warmth. °· Do not take baths, swim, or use a hot tub until your health care provider approves. °· You may shower 24-48 hours after the procedure, or as directed by your health care provider. °? Remove the dressing and gently wash the site with plain soap and water. °? Pat the area dry with a clean towel. °? Do not rub the site. That could cause bleeding. °· Do not apply powder or lotion to the site. °Activity ° °· For 24 hours after the procedure, or as directed by your health care provider: °? Do not flex or bend the affected arm. °? Do not push or pull heavy objects with the affected arm. °? Do not  drive yourself home from the hospital or clinic. You may drive 24 hours after the procedure unless your health care provider tells you not to. °? Do not operate machinery or power tools. °· Do not lift anything that is heavier than 10 lb (4.5 kg), or the limit that you are told, until your health care provider says that it is safe. °· Ask your health care provider when it is okay to: °? Return to work or school. °? Resume usual physical activities or sports. °? Resume sexual activity. °General instructions °· If the catheter site starts to bleed, raise your arm and put firm pressure on the site. If the bleeding does not stop, get help right away. This is a medical emergency. °· If you went home on the same day as your procedure, a responsible adult should be with you for the first 24 hours after you arrive home. °· Keep all follow-up visits as told by your health care provider. This is important. °Contact a health care provider if: °· You have a fever. °· You have redness, swelling, or yellow drainage around your insertion site. °Get help right away if: °· You have unusual pain at the radial site. °· The catheter insertion area swells very fast. °· The insertion area is bleeding, and the bleeding does not stop when you hold steady pressure on the area. °· Your arm or hand becomes pale, cool, tingly, or numb. °These symptoms may represent a serious problem   that is an emergency. Do not wait to see if the symptoms will go away. Get medical help right away. Call your local emergency services (911 in the [place] Do not drive yourself to the hospital. °Summary °· After the procedure, it is common to have bruising and tenderness at the site. °· Follow instructions from your health care provider about how to take care of your radial site wound. Check the wound every day for signs of infection. °· Do not lift anything that is heavier than 10 lb (4.5 kg), or the limit that you are told, until your health care provider says  that it is safe. °This information is not intended to replace advice given to you by your health care provider. Make sure you discuss any questions you have with your health care provider. °Document Released: 05/07/2010 Document Revised: 05/10/2017 Document Reviewed: 05/10/2017 °Elsevier Patient Education © 2020 [employer] ° °

## 2019-03-01 ENCOUNTER — Telehealth: Payer: Self-pay | Admitting: Nurse Practitioner

## 2019-03-01 NOTE — Telephone Encounter (Signed)
Patient reports that she has not been feeling good today. She woke up with diarrhea and has not had an appetite. She denies nausea. She reports a back ache and chills. She does not have a fever, temp 98.100F. She does report urinary frequency. She received a flu shot yesterday evening. She recently had a heart cath on 11/10 and she reports that her site looks good. I advised her that her symptoms did not appear to be cardiac related and could be side effects from the flu shot. Also advised her to follow up with her PCP.

## 2019-03-01 NOTE — Telephone Encounter (Signed)
New Message  Pt is calling because she is not feeling well. She recently had a cath and says yesterday she felt good but today she feels bad. States that she had a flu shot yesterday. Her symptoms are coldness, diarrhea, back aches.  Please call back.

## 2019-03-08 NOTE — Progress Notes (Signed)
CARDIOLOGY OFFICE NOTE  Date:  03/11/2019    Lisa Salinas Date of Birth: November 30, 1955 Medical Record [id number]  PCP:  Jinny Sanders, MD  Cardiologist:  Servando Snare    Chief Complaint  Patient presents with  . Hospitalization Follow-up    Post cath visit - seen for Dr. Tamala Julian    History of Present Illness: Lisa Salinas is a 63 y.o. female who presents today for a post cath visit.   She has a history of recurrent breast cancer - first found in 2006 - with surgery/chemo/XRT and medical therapy. Has had recurrence in 2018 - on tamoxifen.   She was seen in September by Dr. Jana Hakim - endorsed palpitations and dizziness. Husband had had COVID back in June - she herself tested negative.Dr. Jana Hakim referred her to me. She had lots of symptoms with a worrisome FH for early CAD. Short run of VT noted on event monitor. We did a coronary CT which was abnormal - then referred for cath - see below - to manage medically.   The patient does not have symptoms concerning for COVID-19 infection (fever, chills, cough, or new shortness of breath).   Comes in today. Here alone. She feels like she is doing ok. Still has palpitations at times and feels dizzy. She will have some pressure in her chest - not exertional - goes away on its on. Some indigestion - not on PPI therapy. Does not have BP cuff. She is on a low dose of beta blocker and also now on statin for about 3 weeks.   Preliminary monitor with sinus tach, PACs, short run of NSVT noted. Final pending.   Past Medical History:  Diagnosis Date  . Anxiety state, unspecified   . Basal cell carcinoma (BCC) of face   . Breast cancer, right breast ([place]) 10/20/2004   S/P lumpectomy (04/2005) and chemo (03/2005)  . Depression   . Dizziness and giddiness   . Dysrhythmia   . Family history of breast cancer   . Personal history of chemotherapy 03/2005  . Personal history of radiation therapy 05/2005    Past Surgical History:  Procedure  Laterality Date  . ABDOMINAL HYSTERECTOMY     "left my ovaries; no cancer"  . BASAL CELL CARCINOMA EXCISION Right    side of face   . BREAST BIOPSY Bilateral    "2 on the left; 1 on the right; all benign"  . BREAST BIOPSY Right 10/20/2004   malignant  . BREAST EXCISIONAL BIOPSY Left   . BREAST LUMPECTOMY Right 04/2005  . COLONOSCOPY    . LEFT HEART CATH AND CORONARY ANGIOGRAPHY N/A 02/26/2019   Procedure: LEFT HEART CATH AND CORONARY ANGIOGRAPHY;  Surgeon: Belva Crome, MD;  Location: [facility];  Service: Cardiovascular;  Laterality: N/A;  . MASTECTOMY COMPLETE / SIMPLE Right 10/17/2016  . MASTECTOMY W/ SENTINEL NODE BIOPSY Right 10/17/2016   Procedure: RIGHT MASTECTOMY WITH SENTINEL LYMPH NODE BIOPSY;  Surgeon: Fanny Skates, MD;  Location: [facility];  Service: General;  Laterality: Right;  . TONSILLECTOMY  Childhood     Medications: Current Meds  Medication Sig  . Ascorbic Acid (VITAMIN C PO) Take 1 tablet by mouth daily as needed (Cold or cough).  Marland Kitchen aspirin EC 81 MG tablet Take 1 tablet (81 mg total) by mouth daily.  Marland Kitchen atorvastatin (LIPITOR) 10 MG tablet Take 1 tablet (10 mg total) by mouth daily.  . calcium citrate-vitamin D (CITRACAL+D) 315-200 MG-UNIT per tablet Take  1 tablet by mouth 2 (two) times daily.    . cetirizine (ZYRTEC) 10 MG tablet Take 10 mg by mouth daily as needed for allergies.  Marland Kitchen ibandronate (BONIVA) 150 MG tablet TAKE 1 TABLET BY MOUTH EVERY 30 DAYS. TAKE IN THE MORNING WITH A FULL GLASS OF WATER ON EMPTY STOMACH. DO NOT TAKE ANYTHING ELSE BY MOUTH OR LIE DOWN FOR THE NEXT 30 MINUTES  . metoprolol succinate (TOPROL XL) 25 MG 24 hr tablet Take 0.5 tablets (12.5 mg total) by mouth daily.  . sertraline (ZOLOFT) 100 MG tablet TAKE 1 TABLET BY MOUTH EVERY DAY  . tamoxifen (NOLVADEX) 20 MG tablet TAKE 1 TABLET BY MOUTH EVERY DAY  . traZODone (DESYREL) 50 MG tablet Take 0.5-1 tablets (25-50 mg total) by mouth at bedtime as needed for sleep.     Allergies:  Allergies  Allergen Reactions  . Betadine [Povidone Iodine] Rash    Had significant rash when used for surgery.   . Sulfonamide Derivatives Rash    REACTION: [place]    Social History: The patient  reports that she has never smoked. She has never used smokeless tobacco. She reports that she does not drink alcohol or use drugs.   Family History: The patient's family history includes Breast cancer (age of onset: 62) in her maternal aunt; Breast cancer (age of onset: 28) in her maternal aunt; Breast cancer (age of onset: 17) in her cousin; Breast cancer (age of onset: 52) in her paternal aunt; Cancer (age of onset: 62) in her father; Heart disease in her mother; Hyperlipidemia in her mother; Hypertension in her mother.   Review of Systems: Please see the history of present illness.   All other systems are reviewed and negative.   Physical Exam: VS:  BP 108/68   Pulse 63   Ht 5\' 5"  (1.651 m)   Wt 170 lb 6.4 oz (77.3 kg)   SpO2 98%   BMI 28.36 kg/m  .  BMI Body mass index is 28.36 kg/m.  Wt Readings from Last 3 Encounters:  03/11/19 170 lb 6.4 oz (77.3 kg)  02/26/19 170 lb (77.1 kg)  02/13/19 170 lb 12.8 oz (77.5 kg)    General: Pleasant. Well developed, well nourished and in no acute distress.   HEENT: Normal.  Neck: Supple, no JVD, carotid bruits, or masses noted.  Cardiac: Regular rate and rhythm. No murmurs, rubs, or gallops. No edema.  Respiratory:  Lungs are clear to auscultation bilaterally with normal work of breathing.  GI: Soft and nontender.  MS: No deformity or atrophy. Gait and ROM intact.  Skin: Warm and dry. Color is normal.  Neuro:  Strength and sensation are intact and no gross focal deficits noted.  Psych: Alert, appropriate and with normal affect.   LABORATORY DATA:  EKG:  EKG is not ordered today.  Lab Results  Component Value Date   WBC 6.4 02/13/2019   HGB 13.5 02/13/2019   HCT 40.5 02/13/2019   PLT 203 02/13/2019   GLUCOSE 80 02/13/2019   CHOL  163 01/28/2019   TRIG 61.0 01/28/2019   HDL 51.30 01/28/2019   LDLCALC 100 (H) 01/28/2019   ALT 16 02/13/2019   AST 17 02/13/2019   NA 139 02/13/2019   K 3.9 02/13/2019   CL 101 02/13/2019   CREATININE 0.69 02/13/2019   BUN 18 02/13/2019   CO2 27 02/13/2019   TSH 1.010 01/21/2019     BNP (last 3 results) No results for input(s): BNP in the last  8760 hours.  ProBNP (last 3 results) No results for input(s): PROBNP in the last 8760 hours.   Other Studies Reviewed Today:  LEFT HEART CATH AND CORONARY ANGIOGRAPHY 02/2019  Conclusion   Circumflex segmental 60 to 70% proximal to mid stenosis.  FFR CT 0.76 beyond the stenosis.  Symptoms are atypical  Normal left main  30% mid LAD  30% proximal to mid RCA  Normal LV function and LVEDP.  Ejection fraction 65%.  Recommendations:   Aggressive preventive therapy: High intensity statin therapy with LDL target 55 or less.  Consider adding angiotensin blockade.  Phase 2 cardiac rehab.  Close clinical follow-up.  If she develops exertional symptoms, would consider circumflex PCI but hopefully will with lifestyle changes and medical therapy.    Coronary CT 01/2019 IMPRESSION: 1. Coronary artery calcium score 145 Agatston units. This places the patient in the [place] percentile for age and gender, suggesting high risk for future cardiac events.  2. Extensive proximal LAD calcified plaque, suspect mild (<50%) stenosis.  3. Narrowed proximal LCx,?artifact versus soft plaque. If plaque, stenosis appears to be in the 50% range (not critical).  I will send the study for FFR.  [name]  FFR FINDINGS: FFR 0.87 distal LAD  FFR 0.89 distal RCA  FFR 0.75 distal LCx  IMPRESSION: There does appear to be a hemodynamically significant mid LCx stenosis. Suspect soft plaque in this area based on CT angiogram.  [name]   Electronically Signed By: [name] M.D.   [facility] 01/2019  1. Left  ventricular ejection fraction, by visual estimation, is 55 to 60%. The left ventricle has normal function. Normal left ventricular size. Left ventricular septal wall thickness was mildly increased. Mildly increased left ventricular posterior  wall thickness. There is mildly increased left ventricular hypertrophy. 2. Left ventricular diastolic Doppler parameters are indeterminate pattern of LV diastolic filling. 3. Global right ventricle has normal systolic function.The right ventricular size is normal. No increase in right ventricular wall thickness. 4. Left atrial size was normal. 5. Right atrial size was normal. 6. The mitral valve is normal in structure. Trace mitral valve regurgitation. 7. The tricuspid valve is normal in structure. Tricuspid valve regurgitation is trivial. 8. The aortic valve is tricuspid Aortic valve regurgitation was not visualized by color flow Doppler. Structurally normal aortic valve, with no evidence of sclerosis or stenosis. 9. The pulmonic valve was grossly normal. Pulmonic valve regurgitation is not visualized by color flow Doppler. 10. Normal pulmonary artery systolic pressure. 11. The inferior vena cava is normal in size with greater than 50% respiratory variability, suggesting right atrial pressure of 3 mmHg.    Assessment/Plan:  1.Chest pain/CAD with recent cardiac cath - to try and manage medically along with lifestyle changes -discussed at length. She is already walking about 20 minutes a day - will try to increase to 30 to 40. On statin. RX for NTG sent in. Still with some atypical symptoms - nothing exertional - will try adding OTC PPI therapy.   2. HLD - has just started on statin therapy - fasting labs in about 2 weeks.   3. Recurrent breast cancer - followed by oncology  4. Palpitations/NSVT - on beta blocker - low dose - BP may not support increased dose - will give her a BP cuff to have her monitor at home. Final reading on event monitor  is pending.   5. COVID-19 Education: The signs and symptoms of COVID-19 were discussed with the patient and how to seek care  for testing (follow up with PCP or arrange E-visit).  The importance of social distancing, staying at home, hand hygiene and wearing a mask when out in public were discussed today.  Current medicines are reviewed with the patient today.  The patient does not have concerns regarding medicines other than what has been noted above.  The following changes have been made:  See above.  Labs/ tests ordered today include:   No orders of the defined types were placed in this encounter.    Disposition:   FU with me in about 4 to 6 weeks.    Patient is agreeable to this plan and will call if any problems develop in the interim.   SignedTruitt Merle, NP  03/11/2019 2:32 PM  [place] [address] [place], [place]  [address] Phone: [phone] Fax: [phone]

## 2019-03-11 ENCOUNTER — Encounter: Payer: Self-pay | Admitting: Nurse Practitioner

## 2019-03-11 ENCOUNTER — Ambulatory Visit: Payer: BC Managed Care – PPO | Admitting: Nurse Practitioner

## 2019-03-11 ENCOUNTER — Other Ambulatory Visit: Payer: Self-pay

## 2019-03-11 VITALS — BP 108/68 | HR 63 | Ht 65.0 in | Wt 170.4 lb

## 2019-03-11 DIAGNOSIS — R079 Chest pain, unspecified: Secondary | ICD-10-CM | POA: Diagnosis not present

## 2019-03-11 DIAGNOSIS — Z7189 Other specified counseling: Secondary | ICD-10-CM | POA: Diagnosis not present

## 2019-03-11 DIAGNOSIS — Z9889 Other specified postprocedural states: Secondary | ICD-10-CM | POA: Diagnosis not present

## 2019-03-11 DIAGNOSIS — E782 Mixed hyperlipidemia: Secondary | ICD-10-CM

## 2019-03-11 DIAGNOSIS — Z8249 Family history of ischemic heart disease and other diseases of the circulatory system: Secondary | ICD-10-CM | POA: Diagnosis not present

## 2019-03-11 DIAGNOSIS — R55 Syncope and collapse: Secondary | ICD-10-CM

## 2019-03-11 DIAGNOSIS — R002 Palpitations: Secondary | ICD-10-CM

## 2019-03-11 DIAGNOSIS — I259 Chronic ischemic heart disease, unspecified: Secondary | ICD-10-CM

## 2019-03-11 MED ORDER — NITROGLYCERIN 0.4 MG SL SUBL: 0.4000 mg | SUBLINGUAL_TABLET | SUBLINGUAL | 3 refills | Status: AC | PRN

## 2019-03-11 NOTE — Patient Instructions (Addendum)
After Visit Summary:  We will be checking the following labs today - NONE  Fasting lab in about 2 weeks - Lipids and LFTS  Medication Instructions:    Continue with your current medicines. BUT  I would like for you to pick up some OTC Omeprazole/nexium/Prilosec, etc - take one a day I have sent in a RX for NTG - Use your NTG under your tongue for recurrent chest pain. May take one tablet every 5 minutes. If you are still having discomfort after 3 tablets in 15 minutes, call 911.    If you need a refill on your cardiac medications before your next appointment, please call your pharmacy.     Testing/Procedures To Be Arranged:  N/A  Follow-Up:   See me in about 4 to 6 weeks.     At [facility], you and your health needs are our priority.  As part of our continuing mission to provide you with exceptional heart care, we have created designated Provider Care Teams.  These Care Teams include your primary Cardiologist (physician) and Advanced Practice Providers (APPs -  Physician Assistants and Nurse Practitioners) who all work together to provide you with the care you need, when you need it.  Special Instructions:  . Stay safe, stay home, wash your hands for at least 20 seconds and wear a mask when out in public.  . It was good to talk with you today.  . Walking every day - goal 30 to 40 minutes a day . Will try to give you a BP cuff to monitor your BP at home for [place]   Call the [place] office at [phone] if you have any questions, problems or concerns.

## 2019-03-25 ENCOUNTER — Other Ambulatory Visit: Payer: Self-pay

## 2019-03-25 ENCOUNTER — Other Ambulatory Visit: Payer: BC Managed Care – PPO | Admitting: *Deleted

## 2019-03-25 DIAGNOSIS — E782 Mixed hyperlipidemia: Secondary | ICD-10-CM

## 2019-03-25 LAB — LIPID PANEL
Chol/HDL Ratio: 2.1 ratio (ref 0.0–4.4)
Cholesterol, Total: 111 mg/dL (ref 100–199)
HDL: 53 mg/dL (ref >39)
LDL Chol Calc (NIH): 44 mg/dL (ref 0–99)
Triglycerides: 62 mg/dL (ref 0–149)
VLDL Cholesterol Cal: 14 mg/dL (ref 5–40)

## 2019-03-25 LAB — HEPATIC FUNCTION PANEL
ALT: 10 IU/L (ref 0–32)
AST: 14 IU/L (ref 0–40)
Albumin: 4.4 g/dL (ref 3.8–4.8)
Alkaline Phosphatase: 47 IU/L (ref 39–117)
Bilirubin Total: 0.5 mg/dL (ref 0.0–1.2)
Bilirubin, Direct: 0.16 mg/dL (ref 0.00–0.40)
Total Protein: 6.6 g/dL (ref 6.0–8.5)

## 2019-04-04 ENCOUNTER — Other Ambulatory Visit: Payer: Self-pay

## 2019-04-04 ENCOUNTER — Ambulatory Visit (INDEPENDENT_AMBULATORY_CARE_PROVIDER_SITE_OTHER): Payer: BC Managed Care – PPO

## 2019-04-04 DIAGNOSIS — Z23 Encounter for immunization: Secondary | ICD-10-CM | POA: Diagnosis not present

## 2019-04-19 NOTE — Progress Notes (Signed)
CARDIOLOGY OFFICE NOTE  Date:  04/24/2019    [name] Date of Birth: [birth year] Medical Record [id number]  PCP:  Jinny Sanders, MD  Cardiologist:  Servando Snare   Chief Complaint  Patient presents with  . Follow-up    History of Present Illness: NYOBI HERRIAGE is a 64 y.o. female who presents today for a follow up visit. Follows with me.   She has a history of recurrent breast cancer - first found in 2006 - with surgery/chemo/XRT and medical therapy. Has had recurrence in 2018 - on tamoxifen.   She was seenin [place] Dr. Jana Hakim - endorsed palpitations and dizziness. Husband had had COVID back in June - she herself tested negative.Dr. Jana Hakim referred her to me. She had lots of symptoms with a worrisome FH for early CAD. Short run of VT noted on event monitor. We did a coronary CT which was abnormal - then referred for cath - see below - to manage medically but could consider PCI if she were to develop exertional symptoms. I saw her back for her post cath visit in late November - she was doing ok - still with some palpitations and dizziness. Some indigestion. Some pressure in her chest - not exertional.  Event monitor with sinus tach, PACs, short run of NSVT noted. She was on beta blocker therapy. Added OTC PPI at last visit.   The patient does not have symptoms concerning for COVID-19 infection (fever, chills, cough, or new shortness of breath).   Comes in today. Here alone. She is doing well. No chest pain. No palpitations. Not short of breath. May have a rare fleeting feeling in her neck. Overall, feels so much better. BP is ok. Tolerating her medicines. Lipids looked great last month.   Past Medical History:  Diagnosis Date  . Anxiety state, unspecified   . Basal cell carcinoma (BCC) of face   . Breast cancer, right breast ([place]) 10/20/2004   S/P lumpectomy (04/2005) and chemo (03/2005)  . Depression   . Dizziness and giddiness   . Dysrhythmia   . Family  history of breast cancer   . Personal history of chemotherapy 03/2005  . Personal history of radiation therapy 05/2005    Past Surgical History:  Procedure Laterality Date  . ABDOMINAL HYSTERECTOMY     "left my ovaries; no cancer"  . BASAL CELL CARCINOMA EXCISION Right    side of face   . BREAST BIOPSY Bilateral    "2 on the left; 1 on the right; all benign"  . BREAST BIOPSY Right 10/20/2004   malignant  . BREAST EXCISIONAL BIOPSY Left   . BREAST LUMPECTOMY Right 04/2005  . COLONOSCOPY    . LEFT HEART CATH AND CORONARY ANGIOGRAPHY N/A 02/26/2019   Procedure: LEFT HEART CATH AND CORONARY ANGIOGRAPHY;  Surgeon: Belva Crome, MD;  Location: [facility];  Service: Cardiovascular;  Laterality: N/A;  . MASTECTOMY COMPLETE / SIMPLE Right 10/17/2016  . MASTECTOMY W/ SENTINEL NODE BIOPSY Right 10/17/2016   Procedure: RIGHT MASTECTOMY WITH SENTINEL LYMPH NODE BIOPSY;  Surgeon: Fanny Skates, MD;  Location: [facility];  Service: General;  Laterality: Right;  . TONSILLECTOMY  Childhood     Medications: Current Meds  Medication Sig  . Ascorbic Acid (VITAMIN C PO) Take 1 tablet by mouth daily as needed (Cold or cough).  Marland Kitchen aspirin EC 81 MG tablet Take 1 tablet (81 mg total) by mouth daily.  Marland Kitchen atorvastatin (LIPITOR) 10 MG tablet Take 1  tablet (10 mg total) by mouth daily.  . calcium citrate-vitamin D (CITRACAL+D) 315-200 MG-UNIT per tablet Take 1 tablet by mouth 2 (two) times daily.    . cetirizine (ZYRTEC) 10 MG tablet Take 10 mg by mouth daily as needed for allergies.  Marland Kitchen ibandronate (BONIVA) 150 MG tablet TAKE 1 TABLET BY MOUTH EVERY 30 DAYS. TAKE IN THE MORNING WITH A FULL GLASS OF WATER ON EMPTY STOMACH. DO NOT TAKE ANYTHING ELSE BY MOUTH OR LIE DOWN FOR THE NEXT 30 MINUTES  . metoprolol succinate (TOPROL XL) 25 MG 24 hr tablet Take 0.5 tablets (12.5 mg total) by mouth daily.  . nitroGLYCERIN (NITROSTAT) 0.4 MG SL tablet Place 1 tablet (0.4 mg total) under the tongue every 5 (five)  minutes as needed for chest pain.  Marland Kitchen sertraline (ZOLOFT) 100 MG tablet TAKE 1 TABLET BY MOUTH EVERY DAY  . tamoxifen (NOLVADEX) 20 MG tablet TAKE 1 TABLET BY MOUTH EVERY DAY  . traZODone (DESYREL) 50 MG tablet Take 0.5-1 tablets (25-50 mg total) by mouth at bedtime as needed for sleep.     Allergies: Allergies  Allergen Reactions  . Betadine [Povidone Iodine] Rash    Had significant rash when used for surgery.   . Sulfonamide Derivatives Rash    REACTION: [place]    Social History: The patient  reports that she has never smoked. She has never used smokeless tobacco. She reports that she does not drink alcohol or use drugs.   Family History: The patient's family history includes Breast cancer (age of onset: 70) in her maternal aunt; Breast cancer (age of onset: 48) in her maternal aunt; Breast cancer (age of onset: 1) in her cousin; Breast cancer (age of onset: 44) in her paternal aunt; Cancer (age of onset: 45) in her father; Heart disease in her mother; Hyperlipidemia in her mother; Hypertension in her mother.   Review of Systems: Please see the history of present illness.   All other systems are reviewed and negative.   Physical Exam: VS:  BP 110/78 (BP Location: Left Arm, Patient Position: Sitting, Cuff Size: Normal)   Pulse 96   Ht 5\' 5"  (1.651 m)   Wt 172 lb 12.8 oz (78.4 kg)   SpO2 94% Comment: at rest  BMI 28.76 kg/m  .  BMI Body mass index is 28.76 kg/m.  Wt Readings from Last 3 Encounters:  04/24/19 172 lb 12.8 oz (78.4 kg)  03/11/19 170 lb 6.4 oz (77.3 kg)  02/26/19 170 lb (77.1 kg)    General: Pleasant. Alert and in no acute distress.   HEENT: Normal.  Neck: Supple, no JVD, carotid bruits, or masses noted.  Cardiac: Regular rate and rhythm. No murmurs, rubs, or gallops. No edema.  Respiratory:  Lungs are clear to auscultation bilaterally with normal work of breathing.  GI: Soft and nontender.  MS: No deformity or atrophy. Gait and ROM intact.  Skin: Warm and  dry. Color is normal.  Neuro:  Strength and sensation are intact and no gross focal deficits noted.  Psych: Alert, appropriate and with normal affect.   LABORATORY DATA:  EKG:  EKG is not ordered today.  Lab Results  Component Value Date   WBC 6.4 02/13/2019   HGB 13.5 02/13/2019   HCT 40.5 02/13/2019   PLT 203 02/13/2019   GLUCOSE 80 02/13/2019   CHOL 111 03/25/2019   TRIG 62 03/25/2019   HDL 53 03/25/2019   LDLCALC 44 03/25/2019   ALT 10 03/25/2019   AST 14  03/25/2019   NA 139 02/13/2019   K 3.9 02/13/2019   CL 101 02/13/2019   CREATININE 0.69 02/13/2019   BUN 18 02/13/2019   CO2 27 02/13/2019   TSH 1.010 01/21/2019     BNP (last 3 results) No results for input(s): BNP in the last 8760 hours.  ProBNP (last 3 results) No results for input(s): PROBNP in the last 8760 hours.   Other Studies Reviewed Today:   LEFT HEART CATH AND CORONARY ANGIOGRAPHY 02/2019  Conclusion   Circumflex segmental 60 to 70% proximal to mid stenosis. FFR CT 0.76 beyond the stenosis. Symptoms are atypical  Normal left main  30% mid LAD  30% proximal to mid RCA  Normal LV function and LVEDP. Ejection fraction 65%.  Recommendations:   Aggressive preventive therapy: High intensity statin therapy with LDL target 55 or less. Consider adding angiotensin blockade.  Phase 2 cardiac rehab.  Close clinical follow-up.  If she develops exertional symptoms, would consider circumflex PCI but hopefully will with lifestyle changes and medical therapy.   Event Monitor Study Highlights 01/2019  1. The basic rhythm is normal sinus with an average HR of 75 bpm 2. 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 Occasional atrial runs 4. No bradycardic events or pathologic pauses > 3 seconds 5. No atrial fibrillation or flutter 6. No sustained arrhythmia   From Dr. Burt Knack - Reviewed. Will forward to [employer]. Notes reviewed and appears she is on maximally tolerated beta blocker. Likely continue same Rx.  Coronary CT 01/2019 IMPRESSION: 1. Coronary artery calcium score 145 Agatston units. This places the patient in the [place] percentile for age and gender, suggesting high risk for future cardiac events.  2. Extensive proximal LAD calcified plaque, suspect mild (<50%) stenosis.  3. Narrowed proximal LCx,?artifact versus soft plaque. If plaque, stenosis appears to be in the 50% range (not critical).  I will send the study for FFR.  [name]  FFRFINDINGS: FFR 0.87 distal LAD  FFR 0.89 distal RCA  FFR 0.75 distal LCx  IMPRESSION: There does appear to be a hemodynamically significant mid LCx stenosis. Suspect soft plaque in this area based on CT angiogram.  [name]   Electronically Signed By: [name] M.D.   [facility] 01/2019  1. Left ventricular ejection fraction, by visual estimation, is 55 to 60%. The left ventricle has normal function. Normal left ventricular size. Left ventricular septal wall thickness was mildly increased. Mildly increased left ventricular posterior  wall thickness. There is mildly increased left ventricular hypertrophy. 2. Left ventricular diastolic Doppler parameters are indeterminate pattern of LV diastolic filling. 3. Global right ventricle has normal systolic function.The right ventricular size is normal. No increase in right ventricular wall thickness. 4. Left atrial size was normal. 5. Right atrial size was normal. 6. The mitral valve is normal in structure. Trace mitral valve regurgitation. 7. The tricuspid valve is normal in structure. Tricuspid valve regurgitation is trivial. 8. The aortic valve is tricuspid Aortic valve regurgitation was not visualized by color flow Doppler. Structurally normal aortic valve, with no evidence of sclerosis or stenosis. 9. The pulmonic valve was grossly normal. Pulmonic  valve regurgitation is not visualized by color flow Doppler. 10. Normal pulmonary artery systolic pressure. 11. The inferior vena cava is normal in size with greater than 50% respiratory variability, suggesting right atrial pressure of 3 mmHg.    Assessment/Plan:  1.Chest pain/CAD with  prior cardiac cath - to try and manage medically along with lifestyle changes but could consider PCI if has exertional symptoms - she is now doing well. Has no worrisome symptoms at this time. Needs to continue with CV risk factor modification. Needs regular exercise.   2. HLD - nice response with her statin.   3. Recurrent breast cancer - followed by oncology  4. Palpitations/NSVT - on beta blocker - low dose - basically resolved at this time and she is feeling well.   5. COVID-19 Education: The signs and symptoms of COVID-19 were discussed with the patient and how to seek care for testing (follow up with PCP or arrange E-visit).  The importance of social distancing, staying at home, hand hygiene and wearing a mask when out in public were discussed today.  Current medicines are reviewed with the patient today.  The patient does not have concerns regarding medicines other than what has been noted above.  The following changes have been made:  See above.  Labs/ tests ordered today include:   No orders of the defined types were placed in this encounter.    Disposition:   FU with me in 6 months.  Will be available if problems arise sooner.   Patient is agreeable to this plan and will call if any problems develop in the interim.   SignedTruitt Merle, NP  04/24/2019 9:38 AM  [place] [address] [place], [place]  [address] Phone: [phone] Fax: [phone]

## 2019-04-24 ENCOUNTER — Ambulatory Visit: Payer: BC Managed Care – PPO | Admitting: Nurse Practitioner

## 2019-04-24 ENCOUNTER — Encounter: Payer: Self-pay | Admitting: Nurse Practitioner

## 2019-04-24 ENCOUNTER — Other Ambulatory Visit: Payer: Self-pay

## 2019-04-24 VITALS — BP 110/78 | HR 96 | Ht 65.0 in | Wt 172.8 lb

## 2019-04-24 DIAGNOSIS — E782 Mixed hyperlipidemia: Secondary | ICD-10-CM | POA: Diagnosis not present

## 2019-04-24 DIAGNOSIS — I259 Chronic ischemic heart disease, unspecified: Secondary | ICD-10-CM

## 2019-04-24 DIAGNOSIS — Z7189 Other specified counseling: Secondary | ICD-10-CM | POA: Diagnosis not present

## 2019-04-24 DIAGNOSIS — Z9889 Other specified postprocedural states: Secondary | ICD-10-CM

## 2019-04-24 DIAGNOSIS — R002 Palpitations: Secondary | ICD-10-CM

## 2019-04-24 NOTE — Patient Instructions (Addendum)
After Visit Summary:  We will be checking the following labs today - NONE   Medication Instructions:    Continue with your current medicines.    If you need a refill on your cardiac medications before your next appointment, please call your pharmacy.     Testing/Procedures To Be Arranged:  N/A  Follow-Up:   See me in 6 months    At [facility], you and your health needs are our priority.  As part of our continuing mission to provide you with exceptional heart care, we have created designated Provider Care Teams.  These Care Teams include your primary Cardiologist (physician) and Advanced Practice Providers (APPs -  Physician Assistants and Nurse Practitioners) who all work together to provide you with the care you need, when you need it.  Special Instructions:  . Stay safe, stay home, wash your hands for at least 20 seconds and wear a mask when out in public.  . It was good to talk with you today.  . Regular exercise!   Call the [place] office at [phone] if you have any questions, problems or concerns.

## 2019-05-29 ENCOUNTER — Encounter: Payer: Self-pay | Admitting: Family Medicine

## 2019-10-29 NOTE — Progress Notes (Signed)
CARDIOLOGY OFFICE NOTE  Date:  11/04/2019    Lisa Salinas Date of Birth: [birth year] Medical Record #[id number]  PCP:  Jinny Sanders, MD  Cardiologist:  Servando Snare   Chief Complaint  Patient presents with  . Follow-up    History of Present Illness: Lisa Salinas is a 64 y.o. female who presents today for a 6 month check. Follows with me. Referred here to me from oncology.   She has a history of recurrent breast cancer - first found in 2006 - with surgery/chemo/XRT and medical therapy. Has had recurrence in 2018 - on tamoxifen.   [place] was seenin [place] Dr. Jana Hakim - endorsed palpitations and dizziness. Husband had had COVID back in June - she herself tested negative.Dr. Magrinatreferred her to me. She had lots of symptoms with a worrisome FH for early CAD. Short run of VT noted on event monitor. We did a coronary CT which was abnormal - then referred for cath - see below - to manage medically but could consider PCI if she were to develop exertional symptoms.I saw her back for her post cath visit in late November - she was doing ok - still with some palpitations and dizziness. Some indigestion. Some pressure in her chest - not exertional.  Event monitor with sinus tach, PACs, short run of NSVT noted. She was on beta blocker therapy. Added OTC PPI at prior visit.   Last seen in January - she was doing well and feeling better from our standpoint.   Comes in today. Here alone. No chest pain. Breathing is good. Not much in the way of palpitations. Only had one spell but had eaten out and had some tea that most likely had caffeine - she was not able to sleep that night. Otherwise, she is doing ok. Her back is her main issue - this seems to be related more to her mattress on her bed - does not happen when she is at the beach. She has been enjoying her grand kids. Overall, she feels like she is ok. BP running around 778 systolic at home - she is not dizzy or lightheaded.    Past Medical History:  Diagnosis Date  . Anxiety state, unspecified   . Basal cell carcinoma (BCC) of face   . Breast cancer, right breast ([place]) 10/20/2004   S/P lumpectomy (04/2005) and chemo (03/2005)  . Depression   . Dizziness and giddiness   . Dysrhythmia   . Family history of breast cancer   . Personal history of chemotherapy 03/2005  . Personal history of radiation therapy 05/2005    Past Surgical History:  Procedure Laterality Date  . ABDOMINAL HYSTERECTOMY     "left my ovaries; no cancer"  . BASAL CELL CARCINOMA EXCISION Right    side of face   . BREAST BIOPSY Bilateral    "2 on the left; 1 on the right; all benign"  . BREAST BIOPSY Right 10/20/2004   malignant  . BREAST EXCISIONAL BIOPSY Left   . BREAST LUMPECTOMY Right 04/2005  . COLONOSCOPY    . LEFT HEART CATH AND CORONARY ANGIOGRAPHY N/A 02/26/2019   Procedure: LEFT HEART CATH AND CORONARY ANGIOGRAPHY;  Surgeon: Belva Crome, MD;  Location: [place] [facility];  Service: Cardiovascular;  Laterality: N/A;  . MASTECTOMY COMPLETE / SIMPLE Right 10/17/2016  . MASTECTOMY W/ SENTINEL NODE BIOPSY Right 10/17/2016   Procedure: RIGHT MASTECTOMY WITH SENTINEL LYMPH NODE BIOPSY;  Surgeon: Fanny Skates, MD;  Location: [place];  Service: General;  Laterality: Right;  . TONSILLECTOMY  Childhood     Medications: Current Meds  Medication Sig  . Ascorbic Acid (VITAMIN C PO) Take 1 tablet by mouth daily as needed (Cold or cough).  Marland Kitchen aspirin EC 81 MG tablet Take 1 tablet (81 mg total) by mouth daily.  Marland Kitchen atorvastatin (LIPITOR) 10 MG tablet Take 1 tablet (10 mg total) by mouth daily.  . calcium citrate-vitamin D (CITRACAL+D) 315-200 MG-UNIT per tablet Take 1 tablet by mouth 2 (two) times daily.    . cetirizine (ZYRTEC) 10 MG tablet Take 10 mg by mouth daily as needed for allergies.  Marland Kitchen ibandronate (BONIVA) 150 MG tablet TAKE 1 TABLET BY MOUTH EVERY 30 DAYS. TAKE IN THE MORNING WITH A FULL GLASS OF WATER ON EMPTY STOMACH. DO NOT  TAKE ANYTHING ELSE BY MOUTH OR LIE DOWN FOR THE NEXT 30 MINUTES  . metoprolol succinate (TOPROL XL) 25 MG 24 hr tablet Take 0.5 tablets (12.5 mg total) by mouth daily.  . nitroGLYCERIN (NITROSTAT) 0.4 MG SL tablet Place 1 tablet (0.4 mg total) under the tongue every 5 (five) minutes as needed for chest pain.  Marland Kitchen sertraline (ZOLOFT) 100 MG tablet TAKE 1 TABLET BY MOUTH EVERY DAY  . tamoxifen (NOLVADEX) 20 MG tablet TAKE 1 TABLET BY MOUTH EVERY DAY  . traZODone (DESYREL) 50 MG tablet Take 0.5-1 tablets (25-50 mg total) by mouth at bedtime as needed for sleep.     Allergies: Allergies  Allergen Reactions  . Betadine [Povidone Iodine] Rash    Had significant rash when used for surgery.   . Sulfonamide Derivatives Rash    REACTION: [place]    Social History: The patient  reports that she has never smoked. She has never used smokeless tobacco. She reports that she does not drink alcohol and does not use drugs.   Family History: The patient's family history includes Breast cancer (age of onset: 56) in her maternal aunt; Breast cancer (age of onset: 19) in her maternal aunt; Breast cancer (age of onset: 55) in her cousin; Breast cancer (age of onset: 63) in her paternal aunt; Cancer (age of onset: 5) in her father; Heart disease in her mother; Hyperlipidemia in her mother; Hypertension in her mother.   Review of Systems: Please see the history of present illness.   All other systems are reviewed and negative.   Physical Exam: VS:  BP 100/70   Pulse 76   Ht 5\' 4"  (1.626 m)   Wt 168 lb 12.8 oz (76.6 kg)   SpO2 96% Comment: at rest  BMI 28.97 kg/m  .  BMI Body mass index is 28.97 kg/m.  Wt Readings from Last 3 Encounters:  11/04/19 168 lb 12.8 oz (76.6 kg)  04/24/19 172 lb 12.8 oz (78.4 kg)  03/11/19 170 lb 6.4 oz (77.3 kg)    General: Pleasant. Alert and in no acute distress.  Weight is down a few pounds.  HEENT: Normal.  Neck: Supple, no JVD, carotid bruits, or masses noted.    Cardiac: Regular rate and rhythm. No murmurs, rubs, or gallops. No edema.  Respiratory:  Lungs are clear to auscultation bilaterally with normal work of breathing.  GI: Soft and nontender.  MS: No deformity or atrophy. Gait and ROM intact.  Skin: Warm and dry. Color is normal.  Neuro:  Strength and sensation are intact and no gross focal deficits noted.  Psych: Alert, appropriate and with normal affect.   LABORATORY DATA:  EKG:  EKG is  not ordered today.    Lab Results  Component Value Date   WBC 6.4 02/13/2019   HGB 13.5 02/13/2019   HCT 40.5 02/13/2019   PLT 203 02/13/2019   GLUCOSE 80 02/13/2019   CHOL 111 03/25/2019   TRIG 62 03/25/2019   HDL 53 03/25/2019   LDLCALC 44 03/25/2019   ALT 10 03/25/2019   AST 14 03/25/2019   NA 139 02/13/2019   K 3.9 02/13/2019   CL 101 02/13/2019   CREATININE 0.69 02/13/2019   BUN 18 02/13/2019   CO2 27 02/13/2019   TSH 1.010 01/21/2019     BNP (last 3 results) No results for input(s): BNP in the last 8760 hours.  ProBNP (last 3 results) No results for input(s): PROBNP in the last 8760 hours.   Other Studies Reviewed Today:  LEFT HEART CATH AND CORONARY ANGIOGRAPHY11/2020  Conclusion   Circumflex segmental 60 to 70% proximal to mid stenosis. FFR CT 0.76 beyond the stenosis. Symptoms are atypical  Normal left main  30% mid LAD  30% proximal to mid RCA  Normal LV function and LVEDP. Ejection fraction 65%.  Recommendations:   Aggressive preventive therapy: High intensity statin therapy with LDL target 55 or less. Consider adding angiotensin blockade.  Phase 2 cardiac rehab.  Close clinical follow-up.  If she develops exertional symptoms, would consider circumflex PCI but hopefully will with lifestyle changes and medical therapy.   Event Monitor Study Highlights 01/2019  1. The basic rhythm is normal sinus with an average HR of 75 bpm 2. There are rare PVC's and a single 10 beat wide complex run with  a rate of approximately 150 bpm, likely monomorphic NSVT 3. Occasional atrial runs 4. No bradycardic events or pathologic pauses > 3 seconds 5. No atrial fibrillation or flutter 6. No sustained arrhythmia   From Dr. Burt Knack - Reviewed. Will forward to [employer]. Notes reviewed and appears she is on maximally tolerated beta blocker. Likely continue same Rx.  Coronary CT 01/2019 IMPRESSION: 1. Coronary artery calcium score 145 Agatston units. This places the patient in the [place] percentile for age and gender, suggesting high risk for future cardiac events.  2. Extensive proximal LAD calcified plaque, suspect mild (<50%) stenosis.  3. Narrowed proximal LCx,?artifact versus soft plaque. If plaque, stenosis appears to be in the 50% range (not critical).  I will send the study for FFR.  [name]  FFRFINDINGS: FFR 0.87 distal LAD  FFR 0.89 distal RCA  FFR 0.75 distal LCx  IMPRESSION: There does appear to be a hemodynamically significant mid LCx stenosis. Suspect soft plaque in this area based on CT angiogram.  [name]   Electronically Signed By: [name] M.D.   [facility] 01/2019 1. Left ventricular ejection fraction, by visual estimation, is 55 to 60%. The left ventricle has normal function. Normal left ventricular size. Left ventricular septal wall thickness was mildly increased. Mildly increased left ventricular posterior  wall thickness. There is mildly increased left ventricular hypertrophy. 2. Left ventricular diastolic Doppler parameters are indeterminate pattern of LV diastolic filling. 3. Global right ventricle has normal systolic function.The right ventricular size is normal. No increase in right ventricular wall thickness. 4. Left atrial size was normal. 5. Right atrial size was normal. 6. The mitral valve is normal in structure. Trace mitral valve regurgitation. 7. The tricuspid valve is normal in structure. Tricuspid valve  regurgitation is trivial. 8. The aortic valve is tricuspid Aortic valve regurgitation was not visualized by color flow Doppler. Structurally  normal aortic valve, with no evidence of sclerosis or stenosis. 9. The pulmonic valve was grossly normal. Pulmonic valve regurgitation is not visualized by color flow Doppler. 10. Normal pulmonary artery systolic pressure. 11. The inferior vena cava is normal in size with greater than 50% respiratory variability, suggesting right atrial pressure of 3 mmHg.    Assessment/Plan:  1.CAD - prior cath - managing medically - she is doing well. No worrisome chest pain. She notes she could do better with exercise but continues to lose a little weight. Feels good overall. Lab today.   2. HLD - on statin - lab today - would continue.   3. Recurrent breast cancer - followed by Oncology.   4. Palpitations - noted NSVT on her monitor - her symptoms are stable with her current regimen. Would continue with low dose Toprol.   Current medicines are reviewed with the patient today.  The patient does not have concerns regarding medicines other than what has been noted above.  The following changes have been made:  See above.  Labs/ tests ordered today include:    Orders Placed This Encounter  Procedures  . Basic metabolic panel  . CBC  . Hepatic function panel  . Lipid panel     Disposition:   FU with me in 6 months.   Patient is agreeable to this plan and will call if any problems develop in the interim.   SignedTruitt Merle, NP  11/04/2019 8:38 AM  [facility] [place] [address] [place], [place]  [address] Phone: [phone] Fax: [phone]

## 2019-11-04 ENCOUNTER — Other Ambulatory Visit: Payer: Self-pay

## 2019-11-04 ENCOUNTER — Ambulatory Visit: Payer: BC Managed Care – PPO | Admitting: Nurse Practitioner

## 2019-11-04 ENCOUNTER — Encounter: Payer: Self-pay | Admitting: Nurse Practitioner

## 2019-11-04 VITALS — BP 100/70 | HR 76 | Ht 64.0 in | Wt 168.8 lb

## 2019-11-04 DIAGNOSIS — E782 Mixed hyperlipidemia: Secondary | ICD-10-CM | POA: Diagnosis not present

## 2019-11-04 DIAGNOSIS — I472 Ventricular tachycardia: Secondary | ICD-10-CM

## 2019-11-04 DIAGNOSIS — I259 Chronic ischemic heart disease, unspecified: Secondary | ICD-10-CM | POA: Diagnosis not present

## 2019-11-04 DIAGNOSIS — I4729 Other ventricular tachycardia: Secondary | ICD-10-CM

## 2019-11-04 DIAGNOSIS — R002 Palpitations: Secondary | ICD-10-CM | POA: Diagnosis not present

## 2019-11-04 LAB — HEPATIC FUNCTION PANEL
ALT: 12 IU/L (ref 0–32)
AST: 21 IU/L (ref 0–40)
Albumin: 4.3 g/dL (ref 3.8–4.8)
Alkaline Phosphatase: 49 IU/L (ref 48–121)
Bilirubin Total: 0.4 mg/dL (ref 0.0–1.2)
Bilirubin, Direct: 0.12 mg/dL (ref 0.00–0.40)
Total Protein: 6.6 g/dL (ref 6.0–8.5)

## 2019-11-04 LAB — BASIC METABOLIC PANEL
BUN/Creatinine Ratio: 22 (ref 12–28)
BUN: 15 mg/dL (ref 8–27)
CO2: 24 mmol/L (ref 20–29)
Calcium: 9.1 mg/dL (ref 8.7–10.3)
Chloride: 103 mmol/L (ref 96–106)
Creatinine, Ser: 0.69 mg/dL (ref 0.57–1.00)
GFR calc Af Amer: 106 mL/min/{1.73_m2} (ref >59)
GFR calc non Af Amer: 92 mL/min/{1.73_m2} (ref >59)
Glucose: 108 mg/dL — ABNORMAL HIGH (ref 65–99)
Potassium: 3.9 mmol/L (ref 3.5–5.2)
Sodium: 139 mmol/L (ref 134–144)

## 2019-11-04 LAB — LIPID PANEL
Chol/HDL Ratio: 2.2 ratio (ref 0.0–4.4)
Cholesterol, Total: 115 mg/dL (ref 100–199)
HDL: 53 mg/dL (ref >39)
LDL Chol Calc (NIH): 48 mg/dL (ref 0–99)
Triglycerides: 64 mg/dL (ref 0–149)
VLDL Cholesterol Cal: 14 mg/dL (ref 5–40)

## 2019-11-04 LAB — CBC
Hematocrit: 38.6 % (ref 34.0–46.6)
Hemoglobin: 12.8 g/dL (ref 11.1–15.9)
MCH: 29.3 pg (ref 26.6–33.0)
MCHC: 33.2 g/dL (ref 31.5–35.7)
MCV: 88 fL (ref 79–97)
Platelets: 167 10*3/uL (ref 150–450)
RBC: 4.37 x10E6/uL (ref 3.77–5.28)
RDW: 12.5 % (ref 11.7–15.4)
WBC: 5.2 10*3/uL (ref 3.4–10.8)

## 2019-11-04 NOTE — Patient Instructions (Addendum)
After Visit Summary:  We will be checking the following labs today - BMET, CBC, HPF and lipids   Medication Instructions:    Continue with your current medicines.    If you need a refill on your cardiac medications before your next appointment, please call your pharmacy.     Testing/Procedures To Be Arranged:  N/A  Follow-Up:   See me in 6 months    At [facility], you and your health needs are our priority.  As part of our continuing mission to provide you with exceptional heart care, we have created designated Provider Care Teams.  These Care Teams include your primary Cardiologist (physician) and Advanced Practice Providers (APPs -  Physician Assistants and Nurse Practitioners) who all work together to provide you with the care you need, when you need it.  Special Instructions:  . Stay safe, wash your hands for at least 20 seconds and wear a mask when needed.  . It was good to talk with you today.  Marland Kitchen Keep up the good work.    Call the [place] office at [phone] if you have any questions, problems or concerns.

## 2020-01-06 ENCOUNTER — Other Ambulatory Visit: Payer: Self-pay | Admitting: Oncology

## 2020-01-06 DIAGNOSIS — Z1231 Encounter for screening mammogram for malignant neoplasm of breast: Secondary | ICD-10-CM

## 2020-01-29 ENCOUNTER — Telehealth: Payer: Self-pay | Admitting: Family Medicine

## 2020-01-29 DIAGNOSIS — Z13220 Encounter for screening for lipoid disorders: Secondary | ICD-10-CM

## 2020-01-29 NOTE — Telephone Encounter (Signed)
-----   Message from Cloyd Stagers, RT sent at 01/14/2020  4:36 PM EDT ----- Regarding: Lab Orders for Friday 10.15.2021 Please place lab orders for Friday 10.15.2021, office visit for physical on Friday 10.22.2021 Thank you, Dyke Maes RT(R)

## 2020-01-31 ENCOUNTER — Other Ambulatory Visit (INDEPENDENT_AMBULATORY_CARE_PROVIDER_SITE_OTHER): Payer: Medicare PPO

## 2020-01-31 ENCOUNTER — Other Ambulatory Visit: Payer: Self-pay

## 2020-01-31 DIAGNOSIS — Z13220 Encounter for screening for lipoid disorders: Secondary | ICD-10-CM | POA: Diagnosis not present

## 2020-01-31 LAB — LIPID PANEL
Cholesterol: 117 mg/dL (ref 0–200)
HDL: 55 mg/dL (ref >39.00)
LDL Cholesterol: 52 mg/dL (ref 0–99)
NonHDL: 62.25
Total CHOL/HDL Ratio: 2
Triglycerides: 51 mg/dL (ref 0.0–149.0)
VLDL: 10.2 mg/dL (ref 0.0–40.0)

## 2020-01-31 LAB — COMPREHENSIVE METABOLIC PANEL
ALT: 12 U/L (ref 0–35)
AST: 16 U/L (ref 0–37)
Albumin: 4.2 g/dL (ref 3.5–5.2)
Alkaline Phosphatase: 44 U/L (ref 39–117)
BUN: 17 mg/dL (ref 6–23)
CO2: 30 mEq/L (ref 19–32)
Calcium: 9.2 mg/dL (ref 8.4–10.5)
Chloride: 103 mEq/L (ref 96–112)
Creatinine, Ser: 0.77 mg/dL (ref 0.40–1.20)
GFR: 80.99 mL/min (ref >60.00)
Glucose, Bld: 103 mg/dL — ABNORMAL HIGH (ref 70–99)
Potassium: 3.8 mEq/L (ref 3.5–5.1)
Sodium: 140 mEq/L (ref 135–145)
Total Bilirubin: 0.6 mg/dL (ref 0.2–1.2)
Total Protein: 7 g/dL (ref 6.0–8.3)

## 2020-01-31 NOTE — Progress Notes (Signed)
No critical labs need to be addressed urgently. We will discuss labs in detail at upcoming office visit.   

## 2020-02-07 ENCOUNTER — Encounter: Payer: BC Managed Care – PPO | Admitting: Family Medicine

## 2020-02-10 ENCOUNTER — Ambulatory Visit: Payer: BC Managed Care – PPO

## 2020-02-14 ENCOUNTER — Other Ambulatory Visit: Payer: Self-pay | Admitting: Nurse Practitioner

## 2020-02-15 ENCOUNTER — Other Ambulatory Visit: Payer: Self-pay | Admitting: Nurse Practitioner

## 2020-02-20 ENCOUNTER — Other Ambulatory Visit: Payer: Self-pay | Admitting: Family Medicine

## 2020-02-21 ENCOUNTER — Other Ambulatory Visit: Payer: Self-pay

## 2020-02-21 ENCOUNTER — Ambulatory Visit
Admission: RE | Admit: 2020-02-21 | Discharge: 2020-02-21 | Disposition: A | Discharge status: Home / Self Care | Payer: Self-pay | Source: Ambulatory Visit | Attending: Oncology | Admitting: Oncology

## 2020-02-21 DIAGNOSIS — Z1231 Encounter for screening mammogram for malignant neoplasm of breast: Secondary | ICD-10-CM

## 2020-03-01 NOTE — Progress Notes (Signed)
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    She completed tamoxifen in 08/2019.  She did not notice any major changes going off that medication.  She still has occasional hot flashes and occasional leg cramps which she attributed to it.  She continues on ibandronate monthly.  She finds it inconvenient but otherwise tolerates it well.    Since her last visit, she underwent left screening mammography with tomography at [facility] on 02/21/2020 showing: breast density category C; no evidence of malignancy in either breast.    REVIEW OF SYSTEMS: Lisa Salinas exercises by walking about 20 minutes at a time at least a couple of times a week.  She also is very involved with her grandchildren.  There have been some family changes which are disturbing to her also her mother-in-law died just yesterday at age 66, which is of course difficult aside from this a detailed review of systems today was stable   COVID 83 VACCINATION STATUS: Status post [place] x2, with booster pending   BREAST CANCER HISTORY: From the recent summary note:  I saw Lisa Salinas remotely for an invasive right-sided breast cancer initially diagnosed in 2006. We have requested the records for review, but from the patient's recollection: She was treated neoadjuvantly with tamoxifen, but on repeat imaging and there had been no significant  response. She underwent right lumpectomy and sentinel lymph node sampling early in 2007. She tells me the lymph nodes were negative. She then received chemotherapy followed by radiation. She then took Aromasin for 5 years, and was discharged from follow-up here in 2012.  More recently, on 08/18/2016, she underwent bilateral screening mammography with tomography at the breast Center. This found the breast density to be category C. In the right breast there were calcifications warranting further evaluation and on 08/23/2016 she underwent a unilateral right diagnostic mammogram which found pleomorphic calcifications in the upper outer quadrant of the right breast spanning 0.5 cm. There was a second area of punctate calcifications adjacent to the biopsy site slightly more posterior.  On 08/25/2016 she underwent a biopsy of the main area of concern in the right breast, and this found ductal carcinoma in situ, grade 2, estrogen receptor 100% positive, progesterone receptor 10% positive, both with strong staining.  Her subsequent history is as detailed below   PAST MEDICAL HISTORY: Past Medical History:  Diagnosis Date  . Anxiety state, unspecified   . Basal cell carcinoma (BCC) of face   . Breast cancer, right breast ([place]) 10/20/2004   S/P lumpectomy (04/2005) and chemo (03/2005)  . Depression   . Dizziness and giddiness   . Dysrhythmia   . Family history of breast cancer   . Personal history of chemotherapy 03/2005  . Personal history of radiation therapy 05/2005    PAST SURGICAL HISTORY: Past Surgical History:  Procedure Laterality Date  . ABDOMINAL HYSTERECTOMY     "left my ovaries; no cancer"  . BASAL CELL CARCINOMA EXCISION Right  side of face   . BREAST BIOPSY Bilateral    "2 on the left; 1 on the right; all benign"  . BREAST BIOPSY Right 10/20/2004   malignant  . BREAST EXCISIONAL BIOPSY Left   . BREAST LUMPECTOMY Right 04/2005  . COLONOSCOPY    . LEFT HEART CATH AND CORONARY  ANGIOGRAPHY N/A 02/26/2019   Procedure: LEFT HEART CATH AND CORONARY ANGIOGRAPHY;  Surgeon: Lisa Crome, MD;  Location: [facility];  Service: Cardiovascular;  Laterality: N/A;  . MASTECTOMY COMPLETE / SIMPLE Right 10/17/2016  . MASTECTOMY W/ SENTINEL NODE BIOPSY Right 10/17/2016   Procedure: RIGHT MASTECTOMY WITH SENTINEL LYMPH NODE BIOPSY;  Surgeon: Lisa Skates, MD;  Location: [facility];  Service: General;  Laterality: Right;  . TONSILLECTOMY  Childhood    FAMILY HISTORY Family History  Problem Relation Age of Onset  . Heart disease Mother   . Hypertension Mother   . Hyperlipidemia Mother   . Cancer Father 54       lung, smoker  . Breast cancer Maternal Aunt 71  . Breast cancer Paternal Aunt 87  . Breast cancer Maternal Aunt 36  . Breast cancer Cousin 48       paternal first cousin  The patient's father died at age 13 from lung cancer in the setting of tobacco abuse. The patient's mother is living at age 23. The patient has one sister, no brothers. The patient has multiple relatives with breast cancer on both sides of the family and was tested for the BRCA1 and 2 genes remotely and was found to be negative.   GYNECOLOGIC HISTORY:  No LMP recorded. Patient has had a hysterectomy. Menarche age 26, first live birth age 20, the patient is [place] P2. She underwent hysterectomy without salpingo-oophorectomy at age 11. She did not have hormone replacement.   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 Her husband Lisa Salinas is a retired Airline pilot. He is now a used Teacher, early years/pre. Daughter Lisa Salinas works in the nutrition department of the [employer] school system. Son Lisa Salinas lives in [place] and is in Press photographer for [employer]. The patient has 3 granddaughters. She is a Psychologist, forensic.    ADVANCED DIRECTIVES: In the absence of any documentation to the contrary, the patient's spouse is their HCPOA.     HEALTH MAINTENANCE: Social History   Tobacco Use  . Smoking status: Never Smoker  . Smokeless tobacco: Never Used  Vaping Use  . Vaping Use: Never used  Substance Use Topics  . Alcohol use: No  . Drug use: No     Colonoscopy:Due 2018  PAP:  Bone density: 12/21/2017 showed a T-score of -3.1 osteoporosis   Allergies  Allergen Reactions  . Betadine [Povidone Iodine] Rash    Had significant rash when used for surgery.   . Sulfonamide Derivatives Rash    REACTION: [place]    Current Outpatient Medications  Medication Sig Dispense Refill  . Ascorbic Acid (VITAMIN C PO) Take 1 tablet by mouth daily as needed (Cold or cough).    Marland Kitchen aspirin EC 81 MG tablet Take 1 tablet (81 mg total) by mouth daily. 90 tablet 3  . atorvastatin (LIPITOR) 10 MG tablet TAKE 1 TABLET BY MOUTH EVERY DAY 90 tablet 3  . calcium citrate-vitamin D (CITRACAL+D) 315-200 MG-UNIT per tablet Take 1 tablet by mouth 2 (two) times daily.      . cetirizine (ZYRTEC) 10 MG tablet  Take 10 mg by mouth daily as needed for allergies.    Marland Kitchen ibandronate (BONIVA) 150 MG tablet TAKE 1 TABLET BY MOUTH EVERY 30 DAYS. TAKE IN THE MORNING WITH A FULL GLASS OF WATER ON EMPTY STOMACH. DO NOT TAKE ANYTHING ELSE BY MOUTH OR LIE DOWN FOR THE NEXT 30 MINUTES 3 tablet 8  . metoprolol succinate (TOPROL-XL) 25 MG 24 hr tablet TAKE 1/2 TABLET BY MOUTH EVERY DAY 45 tablet 4  . nitroGLYCERIN (NITROSTAT) 0.4 MG SL tablet Place 1 tablet (0.4 mg total) under the tongue every 5 (five) minutes as needed for chest pain. 25 tablet 3  . sertraline (ZOLOFT) 100 MG tablet TAKE 1 TABLET BY MOUTH EVERY DAY 90 tablet 0  . tamoxifen (NOLVADEX) 20 MG tablet TAKE 1 TABLET BY MOUTH EVERY DAY 90 tablet 0  . traZODone (DESYREL) 50 MG tablet Take 0.5-1 tablets (25-50 mg total) by mouth at bedtime as needed for sleep. 30 tablet 3   No current facility-administered medications for this visit.    OBJECTIVE: White woman who appears younger than stated  age  64:   03/02/20 1303  BP: 110/68  Pulse: 77  Resp: 18  Temp: 97.7 F (36.5 C)  SpO2: 100%   Wt Readings from Last 3 Encounters:  03/02/20 169 lb 3.2 oz (76.7 kg)  11/04/19 168 lb 12.8 oz (76.6 kg)  04/24/19 172 lb 12.8 oz (78.4 kg)   Body mass index is 29.04 kg/m.    ECOG FS:1 - Symptomatic but completely ambulatory  Sclerae unicteric, EOMs intact Wearing a mask No cervical or supraclavicular adenopathy Lungs no rales or rhonchi Heart regular rate and rhythm Abd soft, nontender, positive bowel sounds MSK no focal spinal tenderness, no upper extremity lymphedema Neuro: nonfocal, well oriented, appropriate affect Breasts: The right breast is status post mastectomy.  There is no evidence of chest wall recurrence.  The left breast is status post prior benign biopsies in the upper inner quadrant with no evidence of disease activity.  Both axillae are benign.  LAB RESULTS:  CMP     Component Value Date/Time   NA 141 03/02/2020 1243   NA 139 11/04/2019 0843   K 4.1 03/02/2020 1243   CL 105 03/02/2020 1243   CO2 29 03/02/2020 1243   GLUCOSE 86 03/02/2020 1243   BUN 16 03/02/2020 1243   BUN 15 11/04/2019 0843   CREATININE 0.83 03/02/2020 1243   CALCIUM 9.6 03/02/2020 1243   PROT 7.2 03/02/2020 1243   PROT 6.6 11/04/2019 0843   ALBUMIN 4.0 03/02/2020 1243   ALBUMIN 4.3 11/04/2019 0843   AST 16 03/02/2020 1243   ALT 11 03/02/2020 1243   ALKPHOS 53 03/02/2020 1243   BILITOT 0.5 03/02/2020 1243   GFRNONAA >60 03/02/2020 1243   GFRAA 106 11/04/2019 0843    No results found for: TOTALPROTELP, ALBUMINELP, A1GS, A2GS, BETS, BETA2SER, GAMS, MSPIKE, SPEI  No results found for: Nils Pyle, [facility]  Lab Results  Component Value Date   WBC 6.1 03/02/2020   NEUTROABS 3.2 03/02/2020   HGB 13.5 03/02/2020   HCT 42.1 03/02/2020   MCV 91.5 03/02/2020   PLT 186 03/02/2020      Chemistry      Component Value Date/Time   NA 141 03/02/2020 1243    NA 139 11/04/2019 0843   K 4.1 03/02/2020 1243   CL 105 03/02/2020 1243   CO2 29 03/02/2020 1243   BUN 16 03/02/2020 1243   BUN 15 11/04/2019 0843  CREATININE 0.83 03/02/2020 1243      Component Value Date/Time   CALCIUM 9.6 03/02/2020 1243   ALKPHOS 53 03/02/2020 1243   AST 16 03/02/2020 1243   ALT 11 03/02/2020 1243   BILITOT 0.5 03/02/2020 1243       Lab Results  Component Value Date   LABCA2 22 08/31/2010    No components found for: [id number]  No results for input(s): INR in the last 168 hours.  Urinalysis    Component Value Date/Time   COLORURINE lt. yellow 05/20/2010 1551   APPEARANCEUR Hazy 05/20/2010 1551   LABSPEC <1.005 05/20/2010 1551   PHURINE 6.0 05/20/2010 1551   HGBUR trace-lysed 05/20/2010 1551   BILIRUBINUR neg 06/29/2017 1454   KETONESUR negative 02/05/2015 0851   PROTEINUR Neg 06/29/2017 1454   UROBILINOGEN 0.2 06/29/2017 1454   UROBILINOGEN 0.2 05/20/2010 1551   NITRITE Neg 06/29/2017 1454   NITRITE negative 05/20/2010 1551   LEUKOCYTESUR Small (1+) (A) 06/29/2017 1454    STUDIES: MM 3D SCREEN BREAST UNI LEFT  Result Date: 02/24/2020 CLINICAL DATA:  Screening. EXAM: DIGITAL SCREENING UNILATERAL LEFT MAMMOGRAM WITH CAD AND TOMO COMPARISON:  Previous exam(s). ACR Breast Density Category c: The breast tissue is heterogeneously dense, which may obscure small masses. FINDINGS: The patient has had a right mastectomy. There are no findings suspicious for malignancy. Images were processed with CAD. IMPRESSION: No mammographic evidence of malignancy. A result letter of this screening mammogram will be mailed directly to the patient. RECOMMENDATION: Screening mammogram in one year.  (Code:[id number]-L-73M) BI-RADS CATEGORY  1: Negative. Electronically Signed   By: Lillia Mountain M.D.   On: 02/24/2020 13:59     ELIGIBLE FOR AVAILABLE RESEARCH PROTOCOL: No  ASSESSMENT: 64 y.o. BRCA negative [place] woman  (1) status post right lumpectomy and  sentinel lymph node sampling in January 2007 for a ypT1 ypN0 breast cancer  (a) status post neoadjuvant tamoxifen and Herceptin treatment with no significant response  (b) intermediate Oncotype score predicted a 10 year risk of recurrence outside the breast of 14% if she took tamoxifen for 5 years.  (c) status post adjuvant chemotherapy with weekly paclitaxel 8 doses  (d) status post adjuvant radiation  (e) completed 1 year of trastuzumab  (f) status post adjuvant exemestane for 5 years, completed May 2012   (2) status post right breast upper outer quadrant biopsy 08/25/2016 for ductal carcinoma in situ, grade 2, estrogen and progesterone receptor positive  (3) right mastectomy 10/17/2016 confirmed ductal carcinoma in situ, grade 1, measuring 2.8 cm, with negative margins and all 5 sentinel lymph nodes clear.  (4) tamoxifen started 09/15/2016--to be continued through May 2021 (2 years)   (5) further genetics testing 09/27/2016 through the Common Hereditary cancer panel.offered by [facility] i found no deleterious mutations in APC, ATM, AXIN2, BARD1, BMPR1A, BRCA1, BRCA2, BRIP1, CDH1, CDKN2A (p14ARF), CDKN2A (p16INK4a), CHEK2, CTNNA1, DICER1, EPCAM (Deletion/duplication testing only), GREM1 (promoter region deletion/duplication testing only), KIT, MEN1, MLH1, MSH2, MSH3, MSH6, MUTYH, NBN, NF1, NHTL1, PALB2, PDGFRA, PMS2, POLD1, POLE, PTEN, RAD50, RAD51C, RAD51D, SDHB, SDHC, SDHD, SMAD4, SMARCA4. STK11, TP53, TSC1, TSC2, and VHL.  The following genes were evaluated for sequence changes only: SDHA and HOXB13 c.251G>A variant only.   (6) the patient has decided against reconstruction  (7) osteoporosis : bone density on 12/21/2017 showed a T-score of -3.1 at the left femur neck  (a) ibandronate/Boniva started 12/26/2017   PLAN: Rakiyah is now just about 15 years out from her original breast cancer and a  little over 3 years out from her noninvasive breast cancer, with no evidence of disease activity.   This is very favorable.  At this point I feel comfortable releasing her to her primary care physician.  All she will need a yearly breast and chest wall exam by her physician and yearly screening left mammography.  I will be glad to see Aaren again at any point in the future if and when the need arises but as of now are making no further routine appointment for her here.  Total encounter time 25 minutes.*  Magrinat, Lisa Dad, MD  03/02/20 1:38 PM Medical Oncology and Hematology [facility] [place], [place] [address] Tel. [phone]    Fax. [phone]   I, Wilburn Mylar, am acting as scribe for Dr. Virgie Salinas. Magrinat.  I, Lurline Del MD, have reviewed the above documentation for accuracy and completeness, and I agree with the above.    *Total Encounter Time as defined by the Centers for Medicare and Medicaid Services includes, in addition to the face-to-face time of a patient visit (documented in the note above) non-face-to-face time: obtaining and reviewing outside history, ordering and reviewing medications, tests or procedures, care coordination (communications with other health care professionals or caregivers) and documentation in the medical record.

## 2020-03-02 ENCOUNTER — Inpatient Hospital Stay: Payer: Medicare PPO

## 2020-03-02 ENCOUNTER — Inpatient Hospital Stay: Payer: Medicare PPO | Attending: Oncology | Admitting: Oncology

## 2020-03-02 ENCOUNTER — Other Ambulatory Visit: Payer: Self-pay

## 2020-03-02 VITALS — BP 110/68 | HR 77 | Temp 97.7°F | Resp 18 | Ht 64.0 in | Wt 169.2 lb

## 2020-03-02 DIAGNOSIS — Z9221 Personal history of antineoplastic chemotherapy: Secondary | ICD-10-CM | POA: Diagnosis not present

## 2020-03-02 DIAGNOSIS — Z9011 Acquired absence of right breast and nipple: Secondary | ICD-10-CM | POA: Insufficient documentation

## 2020-03-02 DIAGNOSIS — Z86000 Personal history of in-situ neoplasm of breast: Secondary | ICD-10-CM | POA: Insufficient documentation

## 2020-03-02 DIAGNOSIS — D0511 Intraductal carcinoma in situ of right breast: Secondary | ICD-10-CM

## 2020-03-02 DIAGNOSIS — Z170 Estrogen receptor positive status [ER+]: Secondary | ICD-10-CM

## 2020-03-02 DIAGNOSIS — Z853 Personal history of malignant neoplasm of breast: Secondary | ICD-10-CM | POA: Insufficient documentation

## 2020-03-02 DIAGNOSIS — C50911 Malignant neoplasm of unspecified site of right female breast: Secondary | ICD-10-CM

## 2020-03-02 DIAGNOSIS — C50811 Malignant neoplasm of overlapping sites of right female breast: Secondary | ICD-10-CM | POA: Diagnosis not present

## 2020-03-02 LAB — CBC WITH DIFFERENTIAL (CANCER CENTER ONLY)
Abs Immature Granulocytes: 0.02 10*3/uL (ref 0.00–0.07)
Basophils Absolute: 0 10*3/uL (ref 0.0–0.1)
Basophils Relative: 1 %
Eosinophils Absolute: 0.2 10*3/uL (ref 0.0–0.5)
Eosinophils Relative: 4 %
HCT: 42.1 % (ref 36.0–46.0)
Hemoglobin: 13.5 g/dL (ref 12.0–15.0)
Immature Granulocytes: 0 %
Lymphocytes Relative: 35 %
Lymphs Abs: 2.1 10*3/uL (ref 0.7–4.0)
MCH: 29.3 pg (ref 26.0–34.0)
MCHC: 32.1 g/dL (ref 30.0–36.0)
MCV: 91.5 fL (ref 80.0–100.0)
Monocytes Absolute: 0.4 10*3/uL (ref 0.1–1.0)
Monocytes Relative: 7 %
Neutro Abs: 3.2 10*3/uL (ref 1.7–7.7)
Neutrophils Relative %: 53 %
Platelet Count: 186 10*3/uL (ref 150–400)
RBC: 4.6 MIL/uL (ref 3.87–5.11)
RDW: 12.8 % (ref 11.5–15.5)
WBC Count: 6.1 10*3/uL (ref 4.0–10.5)
nRBC: 0 % (ref 0.0–0.2)

## 2020-03-02 LAB — CMP (CANCER CENTER ONLY)
ALT: 11 U/L (ref 0–44)
AST: 16 U/L (ref 15–41)
Albumin: 4 g/dL (ref 3.5–5.0)
Alkaline Phosphatase: 53 U/L (ref 38–126)
Anion gap: 7 (ref 5–15)
BUN: 16 mg/dL (ref 8–23)
CO2: 29 mmol/L (ref 22–32)
Calcium: 9.6 mg/dL (ref 8.9–10.3)
Chloride: 105 mmol/L (ref 98–111)
Creatinine: 0.83 mg/dL (ref 0.44–1.00)
GFR, Estimated: >60 mL/min (ref >60)
Glucose, Bld: 86 mg/dL (ref 70–99)
Potassium: 4.1 mmol/L (ref 3.5–5.1)
Sodium: 141 mmol/L (ref 135–145)
Total Bilirubin: 0.5 mg/dL (ref 0.3–1.2)
Total Protein: 7.2 g/dL (ref 6.5–8.1)

## 2020-03-03 ENCOUNTER — Telehealth: Payer: Self-pay | Admitting: Oncology

## 2020-03-03 NOTE — Telephone Encounter (Signed)
No 11/15 los. No changes made to pt's schedule.

## 2020-03-17 ENCOUNTER — Encounter: Payer: Self-pay | Admitting: Family Medicine

## 2020-03-17 ENCOUNTER — Ambulatory Visit (INDEPENDENT_AMBULATORY_CARE_PROVIDER_SITE_OTHER): Payer: Medicare PPO | Admitting: Family Medicine

## 2020-03-17 ENCOUNTER — Other Ambulatory Visit: Payer: Self-pay

## 2020-03-17 VITALS — BP 98/56 | HR 81 | Temp 97.4°F | Ht 65.0 in | Wt 168.1 lb

## 2020-03-17 DIAGNOSIS — E7800 Pure hypercholesterolemia, unspecified: Secondary | ICD-10-CM | POA: Diagnosis not present

## 2020-03-17 DIAGNOSIS — Z0001 Encounter for general adult medical examination with abnormal findings: Secondary | ICD-10-CM | POA: Diagnosis not present

## 2020-03-17 DIAGNOSIS — M816 Localized osteoporosis [Lequesne]: Secondary | ICD-10-CM

## 2020-03-17 DIAGNOSIS — L989 Disorder of the skin and subcutaneous tissue, unspecified: Secondary | ICD-10-CM | POA: Diagnosis not present

## 2020-03-17 DIAGNOSIS — Z0000 Encounter for general adult medical examination without abnormal findings: Secondary | ICD-10-CM

## 2020-03-17 DIAGNOSIS — F331 Major depressive disorder, recurrent, moderate: Secondary | ICD-10-CM | POA: Diagnosis not present

## 2020-03-17 DIAGNOSIS — Z23 Encounter for immunization: Secondary | ICD-10-CM

## 2020-03-17 DIAGNOSIS — E042 Nontoxic multinodular goiter: Secondary | ICD-10-CM | POA: Insufficient documentation

## 2020-03-17 DIAGNOSIS — E785 Hyperlipidemia, unspecified: Secondary | ICD-10-CM | POA: Insufficient documentation

## 2020-03-17 MED ORDER — TRAZODONE HCL 50 MG PO TABS
25.0000 mg | ORAL_TABLET | Freq: Every evening | ORAL | 3 refills | Status: DC | PRN
Start: 2020-03-17 — End: 2020-04-15

## 2020-03-17 NOTE — Progress Notes (Signed)
ekg 

## 2020-03-17 NOTE — Patient Instructions (Addendum)
Consider COVID booster in 2 weeks.  WE will call to set up [place] of thyroid Please call the location of your choice from the menu below to schedule your  Bone Density appointment.    [place]   1. [facility]                      Phone:  [phone] [address], [place] [address]                                                             Services: Traditional and 3D Mammogram, Bone Density   2. [place] [facility] [facility]                 Phone: [phone] [address], [place] [address]    Service: Bone Density ONLY   *this site does NOT perform mammograms  3. [facility]                        Phone:  [phone] [address] [place], [place] [address]                                            Services:  3D Mammogram and Bone Density    [place]  1. [facility] [facility]   Phone:  [phone]   [place], [place] [address]                                            Services: 3D Mammogram and Bone Density  2. [place] at [facility])  Phone:  [phone]   [address]  [place], [place] [address]                                              Services:  3D Mammogram and Bone Density  

## 2020-03-17 NOTE — Assessment & Plan Note (Signed)
Due for repeat DEXA.Marland Kitchen. now off tamoxifem.

## 2020-03-17 NOTE — Progress Notes (Signed)
Chief Complaint  Patient presents with  . Medicare Wellness    eval knot on neck    History of Present Illness: HPI  64 year old female presents for welcome to medicare wellness.  She also has noted a new lesion on neck anterior.. noted 2 weeks ago. Sore with pressure... mobile.  no redness. No change in size. No past thyroid issues.  Hx of radiation.  No new cold intolerance, no change in energy.    I have personally reviewed the Medicare Annual Wellness questionnaire and have noted 1. The patient's medical and social history 2. Their use of alcohol, tobacco or illicit drugs 3. Their current medications and supplements 4. The patient's functional ability including ADL's, fall risks, home safety risks and hearing or visual             impairment. 5. Diet and physical activities 6. Evidence for depression or mood disorders 7.         Updated provider list Cognitive evaluation was performed and recorded on pt medicare questionnaire form. The patients weight, height, BMI and visual acuity have been recorded in the chart  I have made referrals, counseling and provided education to the patient based review of the above and I have provided the pt with a written personalized care plan for preventive services.   Documentation of this information was scanned into the electronic record under the media tab.  Fall Risk  03/17/2020  Falls in the past year? 0  Number falls in past yr: 0  Injury with Fall? 0     Hearing Screening   125Hz  250Hz  500Hz  1000Hz  2000Hz  3000Hz  4000Hz  6000Hz  8000Hz   Right ear:   20 20 20  20     Left ear:   20 20 20  20       Visual Acuity Screening   Right eye Left eye Both eyes  Without correction: 20/15 20/20 20/13   With correction:        Advance directives and end of life planning reviewed in detail with patient and documented in EMR. Patient given handout on advance care directives if needed. HCPOA and living will updated if needed.  MDD,  recurrent: stable on sertraline 100 mg daily and trazodone for sleep. Increase in family stress   Cholesterol at goal on low dose atorvastatin. Lab Results  Component Value Date   CHOL 117 01/31/2020   HDL 55.00 01/31/2020   LDLCALC 52 01/31/2020   TRIG 51.0 01/31/2020   CHOLHDL 2 01/31/2020   Exercise: 3-4 times a week walking. Diet: good Body mass index is 27.98 kg/m. Wt Readings from Last 3 Encounters:  03/17/20 168 lb 1.9 oz (76.3 kg)  03/02/20 169 lb 3.2 oz (76.7 kg)  11/04/19 168 lb 12.8 oz (76.6 kg)        This visit occurred during the SARS-CoV-2 public health emergency.  Safety protocols were in place, including screening questions prior to the visit, additional usage of staff PPE, and extensive cleaning of exam room while observing appropriate contact time as indicated for disinfecting solutions.   COVID 19 screen:  No recent travel or known exposure to COVID19 The patient denies respiratory symptoms of COVID 19 at this time. The importance of social distancing was discussed today.     Review of Systems  Constitutional: Negative for chills and fever.  HENT: Negative for congestion and ear pain.   Eyes: Negative for pain and redness.  Respiratory: Negative for cough and shortness of breath.   Cardiovascular: Negative for  chest pain, palpitations and leg swelling.  Gastrointestinal: Negative for abdominal pain, blood in stool, constipation, diarrhea, nausea and vomiting.  Genitourinary: Negative for dysuria.  Musculoskeletal: Negative for falls and myalgias.  Skin: Negative for rash.  Neurological: Negative for dizziness.  Psychiatric/Behavioral: Negative for depression. The patient is not nervous/anxious.       Past Medical History:  Diagnosis Date  . Anxiety state, unspecified   . Basal cell carcinoma (BCC) of face   . Breast cancer, right breast ([facility]) 10/20/2004   S/P lumpectomy (04/2005) and chemo (03/2005)  . Depression   . Dizziness and giddiness     . Dysrhythmia   . Family history of breast cancer   . Personal history of chemotherapy 03/2005  . Personal history of radiation therapy 05/2005    reports that she has never smoked. She has never used smokeless tobacco. She reports that she does not drink alcohol and does not use drugs.   Current Outpatient Medications:  .  Ascorbic Acid (VITAMIN C PO), Take 1 tablet by mouth daily as needed (Cold or cough)., Disp: , Rfl:  .  aspirin EC 81 MG tablet, Take 1 tablet (81 mg total) by mouth daily., Disp: 90 tablet, Rfl: 3 .  atorvastatin (LIPITOR) 10 MG tablet, TAKE 1 TABLET BY MOUTH EVERY DAY, Disp: 90 tablet, Rfl: 3 .  calcium citrate-vitamin D (CITRACAL+D) 315-200 MG-UNIT per tablet, Take 1 tablet by mouth 2 (two) times daily.  , Disp: , Rfl:  .  cetirizine (ZYRTEC) 10 MG tablet, Take 10 mg by mouth daily as needed for allergies., Disp: , Rfl:  .  metoprolol succinate (TOPROL-XL) 25 MG 24 hr tablet, TAKE 1/2 TABLET BY MOUTH EVERY DAY, Disp: 45 tablet, Rfl: 4 .  sertraline (ZOLOFT) 100 MG tablet, TAKE 1 TABLET BY MOUTH EVERY DAY, Disp: 90 tablet, Rfl: 0 .  traZODone (DESYREL) 50 MG tablet, Take 0.5-1 tablets (25-50 mg total) by mouth at bedtime as needed for sleep., Disp: 30 tablet, Rfl: 3 .  nitroGLYCERIN (NITROSTAT) 0.4 MG SL tablet, Place 1 tablet (0.4 mg total) under the tongue every 5 (five) minutes as needed for chest pain., Disp: 25 tablet, Rfl: 3   Observations/Objective: Blood pressure (!) 98/56, pulse 81, temperature (!) 97.4 F (36.3 C), height 5\' 5"  (1.651 m), weight 168 lb 1.9 oz (76.3 kg), SpO2 98 %.  EKG: Normal sinus rhythm. Normal axis, normal R wave progression, No acute ST elevation or depression.  No change from previous EKG 2020.  Physical Exam Constitutional:      General: She is not in acute distress.    Appearance: Normal appearance. She is well-developed. She is not ill-appearing or toxic-appearing.  HENT:     Head: Normocephalic.     Right Ear: Hearing,  tympanic membrane, ear canal and external ear normal.     Left Ear: Hearing, tympanic membrane, ear canal and external ear normal.     Nose: Nose normal.  Eyes:     General: Lids are normal. Lids are everted, no foreign bodies appreciated.     Conjunctiva/sclera: Conjunctivae normal.     Pupils: Pupils are equal, round, and reactive to light.  Neck:     Thyroid: No thyroid mass or thyromegaly.     Vascular: No carotid bruit.     Trachea: Trachea normal.      Comments: Mobile marble size nodule over anterior neck Cardiovascular:     Rate and Rhythm: Normal rate and regular rhythm.     Heart  sounds: Normal heart sounds, S1 normal and S2 normal. No murmur heard.  No gallop.   Pulmonary:     Effort: Pulmonary effort is normal. No respiratory distress.     Breath sounds: Normal breath sounds. No wheezing, rhonchi or rales.  Abdominal:     General: Bowel sounds are normal. There is no distension or abdominal bruit.     Palpations: Abdomen is soft. There is no fluid wave or mass.     Tenderness: There is no abdominal tenderness. There is no guarding or rebound.     Hernia: No hernia is present.  Musculoskeletal:     Cervical back: Normal range of motion and neck supple.  Lymphadenopathy:     Cervical: No cervical adenopathy.  Skin:    General: Skin is warm and dry.     Findings: No rash.  Neurological:     Mental Status: She is alert.     Cranial Nerves: No cranial nerve deficit.     Sensory: No sensory deficit.  Psychiatric:        Mood and Affect: Mood is not anxious or depressed.        Speech: Speech normal.        Behavior: Behavior normal. Behavior is cooperative.        Judgment: Judgment normal.      Assessment and Plan    The patient's preventative maintenance and recommended screening tests for an annual wellness exam were reviewed in full today. Brought up to date unless services declined.  Counselled on the importance of diet, exercise, and its role in overall  health and mortality. The patient's FH and SH was reviewed, including their home life, tobacco status, and drug and alcohol status.   Had total hysterectomy .Marland Kitchenno further paps needed. Mammogram: stable 02/2020.. breast cancer history rec yearly Colon: 01/2017 nml , repeat in 10 years. Vaccines: uptodate,  Due for flu, Has  Had 2 COVID vaccines. She is S/P shingrix series. HIV: refused DEXA: 12/2017  osteoporosis. Was Ontamoxifen for 2 year course.  Now off. On boniva as well  Hypercholesteremia  LDL at goal < 70 on atorvastatin.  Major depression, recurrent ([place]) Well controlled. Continue current medication.   Lesion of neck Possible thyroid  Nodule vs cyst... eval with [place].  Osteoporosis Due for repeat DEXA.Marland Kitchen. now off tamoxifem.     Eliezer Lofts, MD

## 2020-03-17 NOTE — Assessment & Plan Note (Signed)
LDL at goal < 70 on atorvastatin.

## 2020-03-17 NOTE — Assessment & Plan Note (Signed)
Possible thyroid  Nodule vs cyst... eval with [place].

## 2020-03-17 NOTE — Addendum Note (Signed)
Addended by: Pilar Grammes on: 03/17/2020 04:39 PM   Modules accepted: Orders

## 2020-03-17 NOTE — Assessment & Plan Note (Signed)
Well controlled. Continue current medication.  

## 2020-03-31 ENCOUNTER — Encounter: Payer: Self-pay | Admitting: Family Medicine

## 2020-03-31 ENCOUNTER — Ambulatory Visit
Admission: RE | Admit: 2020-03-31 | Discharge: 2020-03-31 | Disposition: A | Discharge status: Home / Self Care | Payer: Medicare PPO | Source: Ambulatory Visit | Attending: Family Medicine | Admitting: Family Medicine

## 2020-03-31 DIAGNOSIS — L989 Disorder of the skin and subcutaneous tissue, unspecified: Secondary | ICD-10-CM

## 2020-04-02 ENCOUNTER — Telehealth: Payer: Self-pay

## 2020-04-02 DIAGNOSIS — E042 Nontoxic multinodular goiter: Secondary | ICD-10-CM

## 2020-04-02 NOTE — Telephone Encounter (Signed)
Pt left v/m requesting cb about results on my chart for [place] of thyroid.

## 2020-04-02 NOTE — Telephone Encounter (Signed)
Spoke to pt. She said she read the report and Impression #2 and 3 states that the nodules should be sampled. Dr Rometta Emery note states to repeat imaging in 1 year. Please clarify.

## 2020-04-03 NOTE — Telephone Encounter (Signed)
She is correct.. 2 of the 9 nodules can be considered for fine needle biopsy... I will refer her to ENDO.Marland Kitchen [place] or [place] preference?

## 2020-04-06 NOTE — Telephone Encounter (Signed)
Left message for Ms. Konieczny to return my call.

## 2020-04-07 NOTE — Telephone Encounter (Signed)
Returning phone call °

## 2020-04-07 NOTE — Telephone Encounter (Signed)
Ms. Karpf notified as instructed by telephone.  She is agreeable to Endo referral.  She prefers [place].

## 2020-04-07 NOTE — Telephone Encounter (Signed)
Referral sent 

## 2020-04-15 ENCOUNTER — Other Ambulatory Visit: Payer: Self-pay | Admitting: Family Medicine

## 2020-04-22 NOTE — Progress Notes (Signed)
CARDIOLOGY OFFICE NOTE  Date:  04/27/2020    Lisa Salinas Date of Birth: [birth year] Medical Record #[id number]  PCP:  Excell Seltzer, MD  Cardiologist:  Tyrone Sage     Chief Complaint  Patient presents with  . Follow-up    History of Present Illness: Lisa Salinas is a 65 y.o. female who presents today for a 6 month check. Follows with me.Referred here to me from oncology.   She has a history of recurrent breast cancer - first found in 2006 - with surgery/chemo/XRT and medical therapy. Has had recurrence in 2018 - on tamoxifen.   She was seenin September of 2020by Dr. Darnelle Salinas - endorsed palpitations and dizziness.Dr. Magrinatreferred her to me. She had lots of symptoms with a worrisome FH for early CAD. Short run of VT noted on event monitor. We did a coronary CT which was abnormal - then referred for cath - see below - to manage medicallybut could consider PCI if she were to develop exertional symptoms.I saw her backfor herpost cath visit in late November - she was doing ok - still with some palpitations and dizziness. Some indigestion. Some pressure in her chest - not exertional.Eventmonitor with sinus tach, PACs, short run of NSVT noted. She was on beta blocker therapy.Added OTC PPI at prior visit.  She has done well with medical therapy - last seen in July.   Comes in today. Here alone. Continues to do well. No chest pain. Not short of breath. Not dizzy or lightheaded. Palpitations seems stable. Saw PCP at the end of November. Has had thyroid ultrasound showing some nodules - having biopsy next month. She is a little worried about this with her history. She has had her 3rd COVID vaccine recently - coincided with taking all the lights off her pre lit Christmas tree - did a lot of pulling and lifting - she felt chest tenderness/aching for 24 hours - took Advil and did fine - clearly had discomfort with moving/palpation, etc. Totally resolved by the following  evening. No exertional symptoms. Tolerating her medicines without issue.   Past Medical History:  Diagnosis Date  . Anxiety state, unspecified   . Basal cell carcinoma (BCC) of face   . Breast cancer, right breast ([facility]) 10/20/2004   S/P lumpectomy (04/2005) and chemo (03/2005)  . Depression   . Dizziness and giddiness   . Dysrhythmia   . Family history of breast cancer   . Personal history of chemotherapy 03/2005  . Personal history of radiation therapy 05/2005    Past Surgical History:  Procedure Laterality Date  . ABDOMINAL HYSTERECTOMY     "left my ovaries; no cancer"  . BASAL CELL CARCINOMA EXCISION Right    side of face   . BREAST BIOPSY Bilateral    "2 on the left; 1 on the right; all benign"  . BREAST BIOPSY Right 10/20/2004   malignant  . BREAST EXCISIONAL BIOPSY Left   . BREAST LUMPECTOMY Right 04/2005  . COLONOSCOPY    . LEFT HEART CATH AND CORONARY ANGIOGRAPHY N/A 02/26/2019   Procedure: LEFT HEART CATH AND CORONARY ANGIOGRAPHY;  Surgeon: Lisa Records, MD;  Location: [facility];  Service: Cardiovascular;  Laterality: N/A;  . MASTECTOMY COMPLETE / SIMPLE Right 10/17/2016  . MASTECTOMY W/ SENTINEL NODE BIOPSY Right 10/17/2016   Procedure: RIGHT MASTECTOMY WITH SENTINEL LYMPH NODE BIOPSY;  Surgeon: Lisa Kelp, MD;  Location: [facility] OR;  Service: General;  Laterality: Right;  . TONSILLECTOMY  Childhood     Medications: Current Meds  Medication Sig  . Ascorbic Acid (VITAMIN C PO) Take 1 tablet by mouth daily as needed (Cold or cough).  Marland Kitchen aspirin EC 81 MG tablet Take 1 tablet (81 mg total) by mouth daily.  Marland Kitchen atorvastatin (LIPITOR) 10 MG tablet TAKE 1 TABLET BY MOUTH EVERY DAY  . calcium citrate-vitamin D (CITRACAL+D) 315-200 MG-UNIT per tablet Take 1 tablet by mouth 2 (two) times daily.  . metoprolol succinate (TOPROL-XL) 25 MG 24 hr tablet TAKE 1/2 TABLET BY MOUTH EVERY DAY  . nitroGLYCERIN (NITROSTAT) 0.4 MG SL tablet Place 1 tablet (0.4 mg total) under  the tongue every 5 (five) minutes as needed for chest pain.  Marland Kitchen sertraline (ZOLOFT) 100 MG tablet TAKE 1 TABLET BY MOUTH EVERY DAY  . traZODone (DESYREL) 50 MG tablet TAKE 0.5-1 TABLETS BY MOUTH AT BEDTIME AS NEEDED FOR SLEEP.     Allergies: Allergies  Allergen Reactions  . Betadine [Povidone Iodine] Rash    Had significant rash when used for surgery.   . Sulfonamide Derivatives Rash    REACTION: [place]    Social History: The patient  reports that she has never smoked. She has never used smokeless tobacco. She reports that she does not drink alcohol and does not use drugs.   Family History: The patient's family history includes Breast cancer (age of onset: 52) in her maternal aunt; Breast cancer (age of onset: 60) in her maternal aunt; Breast cancer (age of onset: 66) in her cousin; Breast cancer (age of onset: 19) in her paternal aunt; Cancer (age of onset: 64) in her father; Heart disease in her mother; Hyperlipidemia in her mother; Hypertension in her mother.   Review of Systems: Please see the history of present illness.   All other systems are reviewed and negative.   Physical Exam: VS:  BP 118/80 (BP Location: Left Arm, Patient Position: Sitting, Cuff Size: Normal)   Pulse 83   Ht 5\' 5"  (1.651 m)   Wt 167 lb 12.8 oz (76.1 kg)   SpO2 96% Comment: at rest  BMI 27.92 kg/m  .  BMI Body mass index is 27.92 kg/m.  Wt Readings from Last 3 Encounters:  04/27/20 167 lb 12.8 oz (76.1 kg)  03/17/20 168 lb 1.9 oz (76.3 kg)  03/02/20 169 lb 3.2 oz (76.7 kg)    General: Pleasant. Alert and in no acute distress. She does have a marble size knot in the anterior neck palpated.   Cardiac: Regular rate and rhythm. No murmurs, rubs, or gallops. No edema.  Respiratory:  Lungs are clear to auscultation bilaterally with normal work of breathing.  GI: Soft and nontender.  MS: No deformity or atrophy. Gait and ROM intact.  Skin: Warm and dry. Color is normal.  Neuro:  Strength and sensation  are intact and no gross focal deficits noted.  Psych: Alert, appropriate and with normal affect.   LABORATORY DATA:  EKG:  EKG is not ordered today.   Lab Results  Component Value Date   WBC 6.1 03/02/2020   HGB 13.5 03/02/2020   HCT 42.1 03/02/2020   PLT 186 03/02/2020   GLUCOSE 86 03/02/2020   CHOL 117 01/31/2020   TRIG 51.0 01/31/2020   HDL 55.00 01/31/2020   LDLCALC 52 01/31/2020   ALT 11 03/02/2020   AST 16 03/02/2020   NA 141 03/02/2020   K 4.1 03/02/2020   CL 105 03/02/2020   CREATININE 0.83 03/02/2020   BUN 16 03/02/2020  CO2 29 03/02/2020   TSH 1.010 01/21/2019     BNP (last 3 results) No results for input(s): BNP in the last 8760 hours.  ProBNP (last 3 results) No results for input(s): PROBNP in the last 8760 hours.   Other Studies Reviewed Today:  LEFT HEART CATH AND CORONARY ANGIOGRAPHY11/2020  Conclusion   Circumflex segmental 60 to 70% proximal to mid stenosis. FFR CT 0.76 beyond the stenosis. Symptoms are atypical  Normal left main  30% mid LAD  30% proximal to mid RCA  Normal LV function and LVEDP. Ejection fraction 65%.  Recommendations:   Aggressive preventive therapy: High intensity statin therapy with LDL target 55 or less. Consider adding angiotensin blockade.  Phase 2 cardiac rehab.  Close clinical follow-up.  If she develops exertional symptoms, would consider circumflex PCI but hopefully will with lifestyle changes and medical therapy.   Event MonitorStudy Highlights10/2020  1. The basic rhythm is normal sinus with an average HR of 75 bpm 2. 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 Occasional atrial runs 4. No bradycardic events or pathologic pauses > 3 seconds 5. No atrial fibrillation or flutter 6. No sustained arrhythmia   From Dr. Burt Knack -Reviewed. Will forward to [employer]. Notes reviewed and appears she is on  maximally tolerated beta blocker. Likely continue same Rx.  Coronary CT 01/2019 IMPRESSION: 1. Coronary artery calcium score 145 Agatston units. This places the patient in the [place] percentile for age and gender, suggesting high risk for future cardiac events.  2. Extensive proximal LAD calcified plaque, suspect mild (<50%) stenosis.  3. Narrowed proximal LCx,?artifact versus soft plaque. If plaque, stenosis appears to be in the 50% range (not critical).  I will send the study for FFR.  [name]  FFRFINDINGS: FFR 0.87 distal LAD  FFR 0.89 distal RCA  FFR 0.75 distal LCx  IMPRESSION: There does appear to be a hemodynamically significant mid LCx stenosis. Suspect soft plaque in this area based on CT angiogram.  [name]   Electronically Signed By: [name] M.D.   [facility] 01/2019 1. Left ventricular ejection fraction, by visual estimation, is 55 to 60%. The left ventricle has normal function. Normal left ventricular size. Left ventricular septal wall thickness was mildly increased. Mildly increased left ventricular posterior  wall thickness. There is mildly increased left ventricular hypertrophy. 2. Left ventricular diastolic Doppler parameters are indeterminate pattern of LV diastolic filling. 3. Global right ventricle has normal systolic function.The right ventricular size is normal. No increase in right ventricular wall thickness. 4. Left atrial size was normal. 5. Right atrial size was normal. 6. The mitral valve is normal in structure. Trace mitral valve regurgitation. 7. The tricuspid valve is normal in structure. Tricuspid valve regurgitation is trivial. 8. The aortic valve is tricuspid Aortic valve regurgitation was not visualized by color flow Doppler. Structurally normal aortic valve, with no evidence of sclerosis or stenosis. 9. The pulmonic valve was grossly normal. Pulmonic valve regurgitation is not visualized by color  flow Doppler. 10. Normal pulmonary artery systolic pressure. 11. The inferior vena cava is normal in size with greater than 50% respiratory variability, suggesting right atrial pressure of 3 mmHg.    Assessment/Plan:  1. Thyroid nodule - for biopsy next month - ok to hold aspirin from our standpoint if needed and if further intervention is needed - this would be ok from our standpoint as well. She has no  worrisome symptoms from our standpoint - remains active and can complete over 4 mets of activity.   2. CAD - prior cath - to manage medically - no exertional symptoms noted - continue with CV risk factor modification and current medical therapy.   3. HLD - on statin therapy - her LDL is at goal - would continue current regimen.   4. Recurrent breast cancer - followed by Oncology  5. Palpitations - prior NSVT on her monitor - she is on low dose Toprol - no problems noted.   Current medicines are reviewed with the patient today.  The patient does not have concerns regarding medicines other than what has been noted above.  The following changes have been made:  See above.  Labs/ tests ordered today include:   No orders of the defined types were placed in this encounter.    Disposition:   FU with Dr. Saunders Revel in the [place] office - this is actually closer to her home. She is aware that I am leaving next month.     Patient is agreeable to this plan and will call if any problems develop in the interim.   SignedTruitt Merle, NP  04/27/2020 8:51 AM  [place] [address] [name], [place]  [address] Phone: [phone] Fax: [phone]

## 2020-04-27 ENCOUNTER — Other Ambulatory Visit: Payer: Self-pay

## 2020-04-27 ENCOUNTER — Encounter: Payer: Self-pay | Admitting: Nurse Practitioner

## 2020-04-27 ENCOUNTER — Ambulatory Visit (INDEPENDENT_AMBULATORY_CARE_PROVIDER_SITE_OTHER): Payer: Medicare PPO | Admitting: Nurse Practitioner

## 2020-04-27 VITALS — BP 118/80 | HR 83 | Ht 65.0 in | Wt 167.8 lb

## 2020-04-27 DIAGNOSIS — E782 Mixed hyperlipidemia: Secondary | ICD-10-CM | POA: Diagnosis not present

## 2020-04-27 DIAGNOSIS — R002 Palpitations: Secondary | ICD-10-CM | POA: Diagnosis not present

## 2020-04-27 DIAGNOSIS — I259 Chronic ischemic heart disease, unspecified: Secondary | ICD-10-CM | POA: Diagnosis not present

## 2020-04-27 NOTE — Patient Instructions (Addendum)
After Visit Summary:  We will be checking the following labs today - NONE   Medication Instructions:    Continue with your current medicines.    If you need a refill on your cardiac medications before your next appointment, please call your pharmacy.     Testing/Procedures To Be Arranged:  N/A  Follow-Up:   See Dr. Saunders Revel in the [facility] office in 6 months (as new patient)    At [facility], you and your health needs are our priority.  As part of our continuing mission to provide you with exceptional heart care, we have created designated Provider Care Teams.  These Care Teams include your primary Cardiologist (physician) and Advanced Practice Providers (APPs -  Physician Assistants and Nurse Practitioners) who all work together to provide you with the care you need, when you need it.  Special Instructions:  . Stay safe, wash your hands for at least 20 seconds and wear a mask when needed.  . It was good to talk with you today.    Call the [place] office at [phone] if you have any questions, problems or concerns.

## 2020-05-27 ENCOUNTER — Other Ambulatory Visit: Payer: Self-pay | Admitting: Family Medicine

## 2020-05-28 DIAGNOSIS — D2371 Other benign neoplasm of skin of right lower limb, including hip: Secondary | ICD-10-CM | POA: Diagnosis not present

## 2020-05-28 DIAGNOSIS — Z85828 Personal history of other malignant neoplasm of skin: Secondary | ICD-10-CM | POA: Diagnosis not present

## 2020-05-28 DIAGNOSIS — D225 Melanocytic nevi of trunk: Secondary | ICD-10-CM | POA: Diagnosis not present

## 2020-05-28 DIAGNOSIS — L853 Xerosis cutis: Secondary | ICD-10-CM | POA: Diagnosis not present

## 2020-05-28 DIAGNOSIS — D2372 Other benign neoplasm of skin of left lower limb, including hip: Secondary | ICD-10-CM | POA: Diagnosis not present

## 2020-05-28 DIAGNOSIS — L821 Other seborrheic keratosis: Secondary | ICD-10-CM | POA: Diagnosis not present

## 2020-05-28 DIAGNOSIS — D2261 Melanocytic nevi of right upper limb, including shoulder: Secondary | ICD-10-CM | POA: Diagnosis not present

## 2020-05-28 DIAGNOSIS — D1801 Hemangioma of skin and subcutaneous tissue: Secondary | ICD-10-CM | POA: Diagnosis not present

## 2020-05-28 DIAGNOSIS — L814 Other melanin hyperpigmentation: Secondary | ICD-10-CM | POA: Diagnosis not present

## 2020-06-01 ENCOUNTER — Encounter: Payer: Self-pay | Admitting: Endocrinology

## 2020-06-01 ENCOUNTER — Other Ambulatory Visit (INDEPENDENT_AMBULATORY_CARE_PROVIDER_SITE_OTHER): Payer: Medicare PPO

## 2020-06-01 ENCOUNTER — Telehealth (INDEPENDENT_AMBULATORY_CARE_PROVIDER_SITE_OTHER): Payer: Medicare PPO | Admitting: Endocrinology

## 2020-06-01 ENCOUNTER — Ambulatory Visit: Payer: Medicare PPO | Admitting: Endocrinology

## 2020-06-01 ENCOUNTER — Other Ambulatory Visit: Payer: Self-pay

## 2020-06-01 VITALS — Ht 65.0 in | Wt 168.0 lb

## 2020-06-01 DIAGNOSIS — E042 Nontoxic multinodular goiter: Secondary | ICD-10-CM | POA: Diagnosis not present

## 2020-06-01 LAB — TSH: TSH: 0.78 u[IU]/mL (ref 0.35–4.50)

## 2020-06-01 NOTE — Progress Notes (Signed)
Subjective:    Patient ID: Lisa Salinas, female    DOB: 1955-10-06, 65 y.o.   MRN: [id number]  HPI telehealth visit today via video visit.  Alternatives to telehealth are presented to this patient, and the patient agrees to the telehealth visit. Pt is advised of the cost of the visit, and agrees to this, also.   Patient is at home, and I am at the office.   Persons attending the telehealth visit: the patient and I Pt is referred by Dr Diona Browner, for nodular thyroid.  Pt felt a nodule at the ant neck, in 2021.  she is unaware of ever having had thyroid problems in the past.  She says XRT field (for breast cancer in 2008, dosage unknown) included the neck.  she has no h/o surgery to the neck.  She has sensation of fullness in the throat, and slight flushing.   Past Medical History:  Diagnosis Date  . Anxiety state, unspecified   . Basal cell carcinoma (BCC) of face   . Breast cancer, right breast ([place]) 10/20/2004   S/P lumpectomy (04/2005) and chemo (03/2005)  . Depression   . Dizziness and giddiness   . Dysrhythmia   . Family history of breast cancer   . Personal history of chemotherapy 03/2005  . Personal history of radiation therapy 05/2005    Past Surgical History:  Procedure Laterality Date  . ABDOMINAL HYSTERECTOMY     "left my ovaries; no cancer"  . BASAL CELL CARCINOMA EXCISION Right    side of face   . BREAST BIOPSY Bilateral    "2 on the left; 1 on the right; all benign"  . BREAST BIOPSY Right 10/20/2004   malignant  . BREAST EXCISIONAL BIOPSY Left   . BREAST LUMPECTOMY Right 04/2005  . COLONOSCOPY    . LEFT HEART CATH AND CORONARY ANGIOGRAPHY N/A 02/26/2019   Procedure: LEFT HEART CATH AND CORONARY ANGIOGRAPHY;  Surgeon: Belva Crome, MD;  Location: [facility];  Service: Cardiovascular;  Laterality: N/A;  . MASTECTOMY COMPLETE / SIMPLE Right 10/17/2016  . MASTECTOMY W/ SENTINEL NODE BIOPSY Right 10/17/2016   Procedure: RIGHT MASTECTOMY WITH SENTINEL LYMPH  NODE BIOPSY;  Surgeon: Fanny Skates, MD;  Location: [facility];  Service: General;  Laterality: Right;  . TONSILLECTOMY  Childhood    Social History   Socioeconomic History  . Marital status: Married    Spouse name: Not on file  . Number of children: Not on file  . Years of education: Not on file  . Highest education level: Not on file  Occupational History  . Not on file  Tobacco Use  . Smoking status: Never Smoker  . Smokeless tobacco: Never Used  Vaping Use  . Vaping Use: Never used  Substance and Sexual Activity  . Alcohol use: No  . Drug use: No  . Sexual activity: Not on file  Other Topics Concern  . Not on file  Social History Narrative   MArried, [name]    2 kids age 11 and 30   Social Determinants of Health   Financial Resource Strain: Not on file  Food Insecurity: Not on file  Transportation Needs: Not on file  Physical Activity: Not on file  Stress: Not on file  Social Connections: Not on file  Intimate Partner Violence: Not on file    Current Outpatient Medications on File Prior to Visit  Medication Sig Dispense Refill  . Ascorbic Acid (VITAMIN C PO) Take 1 tablet by  mouth daily as needed (Cold or cough).    Marland Kitchen aspirin EC 81 MG tablet Take 1 tablet (81 mg total) by mouth daily. 90 tablet 3  . atorvastatin (LIPITOR) 10 MG tablet TAKE 1 TABLET BY MOUTH EVERY DAY 90 tablet 3  . calcium citrate-vitamin D (CITRACAL+D) 315-200 MG-UNIT per tablet Take 1 tablet by mouth 2 (two) times daily.    . metoprolol succinate (TOPROL-XL) 25 MG 24 hr tablet TAKE 1/2 TABLET BY MOUTH EVERY DAY 45 tablet 4  . sertraline (ZOLOFT) 100 MG tablet TAKE 1 TABLET BY MOUTH EVERY DAY 90 tablet 1  . traZODone (DESYREL) 50 MG tablet TAKE 0.5-1 TABLETS BY MOUTH AT BEDTIME AS NEEDED FOR SLEEP. 90 tablet 0  . nitroGLYCERIN (NITROSTAT) 0.4 MG SL tablet Place 1 tablet (0.4 mg total) under the tongue every 5 (five) minutes as needed for chest pain. 25 tablet 3   No current  facility-administered medications on file prior to visit.    Allergies  Allergen Reactions  . Betadine [Povidone Iodine] Rash    Had significant rash when used for surgery.   . Sulfonamide Derivatives Rash    REACTION: [place]    Family History  Problem Relation Age of Onset  . Heart disease Mother   . Hypertension Mother   . Hyperlipidemia Mother   . Goiter Mother   . Cancer Father 57       lung, smoker  . Breast cancer Maternal Aunt 23  . Breast cancer Paternal Aunt 51  . Breast cancer Maternal Aunt 61  . Breast cancer Cousin 16       paternal first cousin    Ht 5\' 5"  (1.651 m)   Wt 168 lb (76.2 kg)   BMI 27.96 kg/m    Review of Systems Denies hoarseness, neck pain, sob, and dysphagia.    Objective:   Physical Exam    Lab Results  Component Value Date   TSH 0.78 06/01/2020    [place]: MNG: Right superior nodule and left inferior nodule meet criteria for bx. Right mid nodule also meets criteria for bx. Nodules labeled 3, 5, 7, and 9 meet criteria for f/u US.   I have reviewed outside records, and summarized:  Pt was noted to have MNG, and referred here.       Assessment & Plan:  MNG, new to me.  uncertain etiology and prognosis.     Patient Instructions  Blood tests are requested for you today.  We'll let you know about the results.  Let's check the biopsy, guided by the ultrasound.  you will receive a phone call, about a day and time for an appointment.  If no cancer is found, please come back for a follow-up appointment in 6 months.

## 2020-06-01 NOTE — Patient Instructions (Signed)
Blood tests are requested for you today.  We'll let you know about the results.  Let's check the biopsy, guided by the ultrasound.  you will receive a phone call, about a day and time for an appointment.  If no cancer is found, please come back for a follow-up appointment in 6 months.

## 2020-06-02 ENCOUNTER — Other Ambulatory Visit: Payer: Self-pay | Admitting: Endocrinology

## 2020-06-02 ENCOUNTER — Telehealth: Payer: Self-pay | Admitting: Endocrinology

## 2020-06-02 DIAGNOSIS — E042 Nontoxic multinodular goiter: Secondary | ICD-10-CM

## 2020-06-02 NOTE — Telephone Encounter (Signed)
It is a biopsy, guided by the ultrasound.  I hope this helps.

## 2020-06-02 NOTE — Telephone Encounter (Signed)
Pt called because after her appt with Dr yesterday she was under the impression she was going to need another ultrasound in preparation for her biopsy. However, pt received a call today for them to book her biopsy appt and told her they didn't know why Dr wants her to get another ultrasound because they can see the thyroid nodules and the biopsy can be done without another ultrasound. Pt wants to know if she really does need another ultrasound or if she can move forward with the biopsy?   Please Advise.  Ph# [phone]

## 2020-06-02 NOTE — Telephone Encounter (Signed)
Please advise 

## 2020-06-03 NOTE — Telephone Encounter (Signed)
Notified pt with Dr. Loanne Drilling message.

## 2020-06-11 ENCOUNTER — Other Ambulatory Visit: Payer: Self-pay

## 2020-06-11 ENCOUNTER — Ambulatory Visit
Admission: RE | Admit: 2020-06-11 | Discharge: 2020-06-11 | Disposition: A | Discharge status: Home / Self Care | Payer: Medicare PPO | Source: Ambulatory Visit | Attending: Endocrinology | Admitting: Endocrinology

## 2020-06-11 ENCOUNTER — Other Ambulatory Visit (HOSPITAL_COMMUNITY)
Admission: RE | Admit: 2020-06-11 | Discharge: 2020-06-11 | Disposition: A | Discharge status: Home / Self Care | Payer: Medicare PPO | Source: Ambulatory Visit | Attending: Student | Admitting: Student

## 2020-06-11 DIAGNOSIS — E042 Nontoxic multinodular goiter: Secondary | ICD-10-CM

## 2020-06-11 DIAGNOSIS — D440 Neoplasm of uncertain behavior of thyroid gland: Secondary | ICD-10-CM | POA: Diagnosis not present

## 2020-06-11 DIAGNOSIS — E041 Nontoxic single thyroid nodule: Secondary | ICD-10-CM | POA: Diagnosis not present

## 2020-06-11 DIAGNOSIS — R896 Abnormal cytological findings in specimens from other organs, systems and tissues: Secondary | ICD-10-CM | POA: Diagnosis not present

## 2020-06-12 LAB — CYTOLOGY - NON PAP

## 2020-06-18 ENCOUNTER — Telehealth: Payer: Self-pay | Admitting: Endocrinology

## 2020-06-18 DIAGNOSIS — E042 Nontoxic multinodular goiter: Secondary | ICD-10-CM | POA: Diagnosis not present

## 2020-06-18 NOTE — Telephone Encounter (Signed)
One nodule was category 2--benign.  The other was category 3.  That one had to be sent for DNA analysis, which takes approx 2 weeks.

## 2020-06-18 NOTE — Telephone Encounter (Signed)
Can you please explain to me what a cat 2 and Cat 3 is just in case they pt ask what that means so that I may better assist her before I call her.  Thank you

## 2020-06-18 NOTE — Telephone Encounter (Signed)
2 is benign 3 is borderline, so it is sent for DNA analysis to decide

## 2020-06-18 NOTE — Telephone Encounter (Signed)
Please Advise

## 2020-06-18 NOTE — Telephone Encounter (Signed)
Pt requests a call regarding her recent hospital biopsy lab results.  Ph# [phone]

## 2020-06-19 ENCOUNTER — Encounter: Payer: Self-pay | Admitting: Endocrinology

## 2020-06-19 NOTE — Telephone Encounter (Signed)
Patient called again to request to be called at ph# [phone] to be give her recent hospital biopsy lab results. Patient would like to know the above information before the weekend.

## 2020-06-20 ENCOUNTER — Telehealth: Payer: Self-pay

## 2020-06-20 NOTE — Telephone Encounter (Signed)
Pt has been informed of biopsy results.

## 2020-06-29 ENCOUNTER — Encounter: Payer: Self-pay | Admitting: Endocrinology

## 2020-07-07 ENCOUNTER — Telehealth (INDEPENDENT_AMBULATORY_CARE_PROVIDER_SITE_OTHER): Payer: Medicare PPO | Admitting: Family Medicine

## 2020-07-07 ENCOUNTER — Encounter: Payer: Self-pay | Admitting: Family Medicine

## 2020-07-07 ENCOUNTER — Encounter (HOSPITAL_COMMUNITY): Payer: Self-pay

## 2020-07-07 VITALS — Wt 168.0 lb

## 2020-07-07 DIAGNOSIS — J019 Acute sinusitis, unspecified: Secondary | ICD-10-CM

## 2020-07-07 MED ORDER — AZITHROMYCIN 250 MG PO TABS: ORAL_TABLET | ORAL | 0 refills | Status: DC

## 2020-07-07 NOTE — Progress Notes (Signed)
Subjective:    Patient ID: Lisa Salinas, female    DOB: [birth year], 65 y.o.   MRN: [id number]  HPI Virtual Visit via Video Note  I connected with the patient on 07/07/20 at  8:45 AM EDT by a video enabled telemedicine application and verified that I am speaking with the correct person using two identifiers.  Location patient: home Location provider:work or home office Persons participating in the virtual visit: patient, provider  I discussed the limitations of evaluation and management by telemedicine and the availability of in person appointments. The patient expressed understanding and agreed to proceed.   HPI: Here for one week of sinus pressure, PND, hoarse voice, and a dry cough. She has also had some body aches. No fever or SOB or NVD. She is taking Sudafed and drinking fluids.    ROS: See pertinent positives and negatives per HPI.  Past Medical History:  Diagnosis Date  . Anxiety state, unspecified   . Basal cell carcinoma (BCC) of face   . Breast cancer, right breast ([facility]) 10/20/2004   S/P lumpectomy (04/2005) and chemo (03/2005)  . Depression   . Dizziness and giddiness   . Dysrhythmia   . Family history of breast cancer   . Personal history of chemotherapy 03/2005  . Personal history of radiation therapy 05/2005    Past Surgical History:  Procedure Laterality Date  . ABDOMINAL HYSTERECTOMY     "left my ovaries; no cancer"  . BASAL CELL CARCINOMA EXCISION Right    side of face   . BREAST BIOPSY Bilateral    "2 on the left; 1 on the right; all benign"  . BREAST BIOPSY Right 10/20/2004   malignant  . BREAST EXCISIONAL BIOPSY Left   . BREAST LUMPECTOMY Right 04/2005  . COLONOSCOPY    . LEFT HEART CATH AND CORONARY ANGIOGRAPHY N/A 02/26/2019   Procedure: LEFT HEART CATH AND CORONARY ANGIOGRAPHY;  Surgeon: Belva Crome, MD;  Location: [facility];  Service: Cardiovascular;  Laterality: N/A;  . MASTECTOMY COMPLETE / SIMPLE Right 10/17/2016  .  MASTECTOMY W/ SENTINEL NODE BIOPSY Right 10/17/2016   Procedure: RIGHT MASTECTOMY WITH SENTINEL LYMPH NODE BIOPSY;  Surgeon: Fanny Skates, MD;  Location: [facility];  Service: General;  Laterality: Right;  . TONSILLECTOMY  Childhood    Family History  Problem Relation Age of Onset  . Heart disease Mother   . Hypertension Mother   . Hyperlipidemia Mother   . Goiter Mother   . Cancer Father 74       lung, smoker  . Breast cancer Maternal Aunt 61  . Breast cancer Paternal Aunt 97  . Breast cancer Maternal Aunt 28  . Breast cancer Cousin 80       paternal first cousin     Current Outpatient Medications:  .  Ascorbic Acid (VITAMIN C PO), Take 1 tablet by mouth daily as needed (Cold or cough)., Disp: , Rfl:  .  aspirin EC 81 MG tablet, Take 1 tablet (81 mg total) by mouth daily., Disp: 90 tablet, Rfl: 3 .  atorvastatin (LIPITOR) 10 MG tablet, TAKE 1 TABLET BY MOUTH EVERY DAY, Disp: 90 tablet, Rfl: 3 .  azithromycin (ZITHROMAX Z-PAK) 250 MG tablet, As directed, Disp: 6 each, Rfl: 0 .  calcium citrate-vitamin D (CITRACAL+D) 315-200 MG-UNIT per tablet, Take 1 tablet by mouth 2 (two) times daily., Disp: , Rfl:  .  metoprolol succinate (TOPROL-XL) 25 MG 24 hr tablet, TAKE 1/2 TABLET BY MOUTH EVERY DAY,  Disp: 45 tablet, Rfl: 4 .  sertraline (ZOLOFT) 100 MG tablet, TAKE 1 TABLET BY MOUTH EVERY DAY, Disp: 90 tablet, Rfl: 1 .  traZODone (DESYREL) 50 MG tablet, TAKE 0.5-1 TABLETS BY MOUTH AT BEDTIME AS NEEDED FOR SLEEP., Disp: 90 tablet, Rfl: 0 .  nitroGLYCERIN (NITROSTAT) 0.4 MG SL tablet, Place 1 tablet (0.4 mg total) under the tongue every 5 (five) minutes as needed for chest pain., Disp: 25 tablet, Rfl: 3  EXAM:  VITALS per patient if applicable:  GENERAL: alert, oriented, appears well and in no acute distress  HEENT: atraumatic, conjunttiva clear, no obvious abnormalities on inspection of external nose and ears  NECK: normal movements of the head and neck  LUNGS: on inspection no signs of  respiratory distress, breathing rate appears normal, no obvious gross SOB, gasping or wheezing  CV: no obvious cyanosis  MS: moves all visible extremities without noticeable abnormality  PSYCH/NEURO: pleasant and cooperative, no obvious depression or anxiety, speech and thought processing grossly intact  ASSESSMENT AND PLAN: This is likely a sinusitis, and we will treat it with a Zpack. However I also advised her to be tested for the Covid virus, and she agreed.  Alysia Penna, MD  Discussed the following assessment and plan:  No diagnosis found.     I discussed the assessment and treatment plan with the patient. The patient was provided an opportunity to ask questions and all were answered. The patient agreed with the plan and demonstrated an understanding of the instructions.   The patient was advised to call back or seek an in-person evaluation if the symptoms worsen or if the condition fails to improve as anticipated.     Review of Systems     Objective:   Physical Exam        Assessment & Plan:

## 2020-09-07 ENCOUNTER — Telehealth: Payer: Self-pay | Admitting: Family Medicine

## 2020-09-07 NOTE — Telephone Encounter (Addendum)
I just saw message sent to me to triage this pt; I spoke with pt; pt said starting after daughter tested + about 4 wks ago; pt tested neg on covid home test 2 wks ago. Now pt experiencing achiness all over, S/t that hurts badly when swallows; pt said no rattling but felt like had a lot of chest congestion and pt was wheezing some. Pt took some OTC DM cough med;cough med did help some but pt has continued prod cough with greenish, yellowish and brownish phlegm. After feels very congested in chest pt does have CP and tightness.  pt said has head congestion and runny nose. Pt had bad night of coughing several nights ago and again last night. Pt has drainage at back of throat; pt thought she was getting better but after 2 days and then pt started over again with achiness and all above symptoms reappeared again. No fever. Pt has SOB after coughing episodes and then headache pain level of 6. No loss of smell or taste. Pt has head congestion. Pt said she has not tested for covid again since 2 wks ago. I advised pt with her symptoms that she could be seen at [facility] for eval and any needed testing. Pt said she did not want to go to UC and pay $100.00 and sit there for hrs. Pt wanted to know why she could not come in office at [facility] to be seen. I advised pt that I am not saying she has covid but she has multiple possible covid symptoms. Pt wanted a video visit; scheduled video visit with Gentry Fitz NP on 09/08/20 at 3:20 pm. UC & ED precautions given and pt voiced understanding. Sending note to Dr Diona Browner as PCP and Gentry Fitz NP.

## 2020-09-07 NOTE — Telephone Encounter (Signed)
Pt called in and stated that she is on a 2nd round of medication. And she took a test and it was negative and she was exposed to covid by her father in law and now she is expericing all the symptoms  all l over again and wanted to know about what she can take.

## 2020-09-07 NOTE — Telephone Encounter (Signed)
Please send through triage.

## 2020-09-08 ENCOUNTER — Telehealth (INDEPENDENT_AMBULATORY_CARE_PROVIDER_SITE_OTHER): Payer: Medicare PPO | Admitting: Primary Care

## 2020-09-08 ENCOUNTER — Other Ambulatory Visit: Payer: Self-pay

## 2020-09-08 VITALS — HR 86 | Wt 168.0 lb

## 2020-09-08 DIAGNOSIS — R059 Cough, unspecified: Secondary | ICD-10-CM

## 2020-09-08 DIAGNOSIS — R051 Acute cough: Secondary | ICD-10-CM | POA: Insufficient documentation

## 2020-09-08 MED ORDER — HYDROCOD POLST-CPM POLST ER 10-8 MG/5ML PO SUER: 5.0000 mL | Freq: Two times a day (BID) | ORAL | 0 refills | Status: DC | PRN

## 2020-09-08 NOTE — Telephone Encounter (Signed)
Noted, will evaluate. 

## 2020-09-08 NOTE — Progress Notes (Signed)
Patient ID: Lisa Salinas, female    DOB: Oct 17, 1955, 65 y.o.   MRN: [id number]  Virtual visit completed through [place], a video enabled telemedicine application. Due to national recommendations of social distancing due to COVID-19, a virtual visit is felt to be most appropriate for this patient at this time. Reviewed limitations, risks, security and privacy concerns of performing a virtual visit and the availability of in person appointments. I also reviewed that there may be a patient responsible charge related to this service. The patient agreed to proceed.   Patient location: home Provider location: [place] at [facility], office Persons participating in this virtual visit: patient, provider   If any vitals were documented, they were collected by patient at home unless specified below.    Pulse 86   Wt 168 lb (76.2 kg)   SpO2 96%   BMI 27.96 kg/m    CC: Cough Subjective:   HPI: Lisa Salinas is a 65 y.o. female patient of Dr. Diona Browner with a history of CAD, recurrent UTI, breast cancer presenting on 09/08/2020 to discuss cough.   She was evaluated by Dr. Sarajane Jews virtually in late March, treated for sinusitis with Zpak. Recovered. She then developed the "stomach bug" which lasted five days.   Symptoms for cough originally began 4 weeks ago, lasted for 2 weeks, took a Covid test 4 weeks ago which was negative. She was exposed to Covid by her daughter-in-law. Symptoms included cough, chest congestion with green mucous from nose and mouth, fevers. She was taking Zyretc-D and Tylenol during this round. She improved for a week.  Symptoms resumed three days ago with sore throat, body aches, cough, chest congestion, rhinorrhea. Mucous is clear. She's now taking Zyrtec-D, Delsym DM with some improvement. She will experience terrible coughing spells, feels some better today, does have drainage and a tickle to her throat. She has yet to test for Covid-19.         Relevant past  medical, surgical, family and social history reviewed and updated as indicated. Interim medical history since our last visit reviewed. Allergies and medications reviewed and updated. Outpatient Medications Prior to Visit  Medication Sig Dispense Refill  . Ascorbic Acid (VITAMIN C PO) Take 1 tablet by mouth daily as needed (Cold or cough).    Marland Kitchen aspirin EC 81 MG tablet Take 1 tablet (81 mg total) by mouth daily. 90 tablet 3  . atorvastatin (LIPITOR) 10 MG tablet TAKE 1 TABLET BY MOUTH EVERY DAY 90 tablet 3  . calcium citrate-vitamin D (CITRACAL+D) 315-200 MG-UNIT per tablet Take 1 tablet by mouth 2 (two) times daily.    Marland Kitchen Dextromethorphan HBr (DELSYM PO) Take by mouth as needed (for cough).    . metoprolol succinate (TOPROL-XL) 25 MG 24 hr tablet TAKE 1/2 TABLET BY MOUTH EVERY DAY 45 tablet 4  . sertraline (ZOLOFT) 100 MG tablet TAKE 1 TABLET BY MOUTH EVERY DAY 90 tablet 1  . traZODone (DESYREL) 50 MG tablet TAKE 0.5-1 TABLETS BY MOUTH AT BEDTIME AS NEEDED FOR SLEEP. 90 tablet 0  . nitroGLYCERIN (NITROSTAT) 0.4 MG SL tablet Place 1 tablet (0.4 mg total) under the tongue every 5 (five) minutes as needed for chest pain. 25 tablet 3  . azithromycin (ZITHROMAX Z-PAK) 250 MG tablet As directed 6 each 0   No facility-administered medications prior to visit.     Per HPI unless specifically indicated in ROS section below Review of Systems  Constitutional: Negative for chills and fever.  HENT: Positive  for congestion, postnasal drip and rhinorrhea.   Respiratory: Positive for cough.    Objective:  Pulse 86   Wt 168 lb (76.2 kg)   SpO2 96%   BMI 27.96 kg/m   Wt Readings from Last 3 Encounters:  09/08/20 168 lb (76.2 kg)  07/07/20 168 lb (76.2 kg)  06/01/20 168 lb (76.2 kg)       Physical exam: Gen: alert, NAD, not ill appearing Pulm: speaks in complete sentences without increased work of breathing, some dry coughing noted during visit. Psych: normal mood, normal thought content       Results for orders placed or performed during the hospital encounter of 06/11/20  Cytology - Non PAP; RT LOBE THYROID  Result Value Ref Range   CYTOLOGY - NON GYN      CYTOLOGY - NON PAP CASE: [id number] PATIENT: Lisa Salinas Non-Gynecological Cytology Report     Clinical History: TI-RADS total points 4 Specimen Submitted:  A. THYROID GLAND, RIGHT LOBE, RUP - NODULE #4, FINE NEEDLE ASPIRATION:   FINAL MICROSCOPIC DIAGNOSIS: - Consistent with benign follicular nodule (Bethesda category II)  SPECIMEN ADEQUACY: Satisfactory for evaluation  GROSS: Received is/are 30 cc's of light peach cytolyt solution and 6 slides in ethyl alcohol (TB:tb) Smears: 6 Concentration Method (Thin Prep): 1 Cell Block: Attempted, not obtained Additional Studies: Afirma collected     Final Diagnosis performed by Gillie Manners, MD.   Electronically signed 06/12/2020 Technical and / or Professional components performed at [facility]. [facility], [place] [address], [place], [place] [address].  Immunohistochemistry Technical component (if applicable) was performed at [facility]. [place], [place] [address].   IMMUNOHISTOCHEMISTRY DISCLAIMER (if applicable): Some of these immunohistochemical stains may have been developed and the performance characteristics determine by [facility]. Some may not have been cleared or approved by the U.S. Food and Drug Administration. The FDA has determined that such clearance or approval is not necessary. This test is used for clinical purposes. It should not be regarded as investigational or for research. This laboratory is certified under the [place] (CLIA-88) as qualified to perform high complexity clinical laboratory testing.  The controls stained appropriately.    Assessment & Plan:   Problem List Items Addressed This Visit      Other   Cough  - Primary    Acute symptoms x 3 days, more suspicious for viral etiology. Today she's feeling better which is reassuring. She appears stable.   She will go ahead and retest for COVID as she has a home test.  Discussed to discontinue Zyrtec-D given potential rebound effects.  She will switch to plain Zyrtec.  We will also send over Tussionex for her to take twice daily as needed for cough, drowsiness precautions provided.  She will call us no later than Friday this week if she does not continue to improve, consider chest x-ray/antibiotic treatment if symptoms progress.      Relevant Medications   chlorpheniramine-HYDROcodone (TUSSIONEX PENNKINETIC ER) 10-8 MG/5ML SUER       Meds ordered this encounter  Medications  . chlorpheniramine-HYDROcodone (TUSSIONEX PENNKINETIC ER) 10-8 MG/5ML SUER    Sig: Take 5 mLs by mouth every 12 (twelve) hours as needed for cough.    Dispense:  50 mL    Refill:  0    Order Specific Question:   Supervising Provider    Answer:   BEDSOLE, AMY E [[id number]]   No orders  of the defined types were placed in this encounter.   I discussed the assessment and treatment plan with the patient. The patient was provided an opportunity to ask questions and all were answered. The patient agreed with the plan and demonstrated an understanding of the instructions. The patient was advised to call back or seek an in-person evaluation if the symptoms worsen or if the condition fails to improve as anticipated.  Follow up plan:  You may take the cough suppressant every 12 hours as needed for cough and rest. Caution this medication contains codeine which may cause drowsiness.   Stop taking Zyrtec-D. Start regular Zyrtec as discussed.   Please call me by Friday this week if no improvement.  It was a pleasure meeting you! Allie Bossier, NP-C   Pleas Koch, NP

## 2020-09-08 NOTE — Assessment & Plan Note (Signed)
Acute symptoms x 3 days, more suspicious for viral etiology. Today she's feeling better which is reassuring. She appears stable.   She will go ahead and retest for COVID as she has a home test.  Discussed to discontinue Zyrtec-D given potential rebound effects.  She will switch to plain Zyrtec.  We will also send over Tussionex for her to take twice daily as needed for cough, drowsiness precautions provided.  She will call us no later than Friday this week if she does not continue to improve, consider chest x-ray/antibiotic treatment if symptoms progress.

## 2020-09-10 DIAGNOSIS — R059 Cough, unspecified: Secondary | ICD-10-CM

## 2020-09-10 MED ORDER — AZITHROMYCIN 250 MG PO TABS: ORAL_TABLET | ORAL | 0 refills | Status: DC

## 2020-09-22 ENCOUNTER — Telehealth: Payer: Medicare PPO | Admitting: Nurse Practitioner

## 2020-09-22 ENCOUNTER — Telehealth: Payer: Self-pay

## 2020-09-22 DIAGNOSIS — W57XXXA Bitten or stung by nonvenomous insect and other nonvenomous arthropods, initial encounter: Secondary | ICD-10-CM | POA: Diagnosis not present

## 2020-09-22 DIAGNOSIS — S70369A Insect bite (nonvenomous), unspecified thigh, initial encounter: Secondary | ICD-10-CM | POA: Diagnosis not present

## 2020-09-22 MED ORDER — DOXYCYCLINE HYCLATE 100 MG PO TABS
100.0000 mg | ORAL_TABLET | Freq: Two times a day (BID) | ORAL | 0 refills | Status: AC
Start: 2020-09-22 — End: 2020-10-13

## 2020-09-22 NOTE — Progress Notes (Signed)
Thank you for describing your tick bite, Here is how we plan to help! Based on the information that you shared with me it looks like you have An infected or complicated tick bite that requires a longer course of antibiotics and will need for you to schedule a follow-up visit with a provider.   We will start you on antibiotics today, but please schedule a follow up with your primary care this week.   In most cases a tick bite is painless and does not itch.  Most tick bites in which the tick is quickly removed do not require prescriptions. Ticks can transmit several diseases if they are infected and remain attacked to your skin. Therefore the length that the tick was attached and any symptoms you have experienced after the bite are import to accurately develop your custom treatment plan. In most cases a single dose of doxycycline may prevent the development of a more serious condition.  Based on your information I have Provided a home care guide for tick bites and  instructions on when to call for help. and Your symptoms indicate that you need a longer course of antibiotics and a follow up visit with a provider. I have sent doxycycline 100 mg twice a day for 21 days to the pharmacy that you selected. You will need to schedule a follow up visit with your provider. If you do not have a primary care provider you may use our telehealth physicians on the web at [place]  are associated with illness?  The Wood Tick (dog tick) is the size of a watermelon seed and can sometimes transmit [facility] spotted fever and [place] tick fever.   The Deer Tick (black-legged tick) is between the size of a poppy seed (pin head) and an apple seed, and can sometimes transmit Lyme disease.  A brown to black tick with a white splotch on its back is likely a female Amblyomma americanum ([place] tick). This tick has been associated with Southern Tick Associated illness ( STARI)  Lyme disease has  become the most common tick-borne illness in the [place]. The risk of Lyme disease following a recognized deer tick bite is estimated to be 1%.  The majority of cases of Lyme disease start with a bull's eye rash at the site of the tick bite. The rash can occur days to weeks (typically 7-10 days) after a tick bite. Treatment with antibiotics is indicated if this rash appears. Flu-like symptoms may accompany the rash, including: fever, chills, headaches, muscle aches, and fatigue. Removing ticks promptly may prevent tick borne disease.  What can be used to prevent Tick Bites?   Insect repellant with at leas 20% DEET.  Wearing long pants with sock and shoes.  Avoiding tall grass and heavily wooded areas.  Checking your skin after being outdoors.  Shower with a washcloth after outdoor exposures.  HOME CARE ADVICE FOR TICK BITE  1. Wood Tick Removal:  o Use a pair of tweezers and grasp the wood tick close to the skin (on its head). Pull the wood tick straight upward without twisting or crushing it. Maintain a steady pressure until it releases its grip.   o If tweezers aren't available, use fingers, a loop of thread around the jaws, or a needle between the jaws for traction.  o Note: covering the tick with petroleum jelly, nail polish or rubbing alcohol doesn't work. Neither does touching the tick with a hot or cold object. 2. Tiny  Deer Tick Removal:   o Needs to be scraped off with a knife blade or credit card edge. o Place tick in a sealed container (e.g. glass jar, zip lock plastic bag), in case your doctor wants to see it. 3. Tick's Head Removal:  o If the wood tick's head breaks off in the skin, it must be removed. Clean the skin. Then use a sterile needle to uncover the head and lift it out or scrape it off.  o If a very small piece of the head remains, the skin will eventually slough it off. 4. Antibiotic Ointment:  o Wash the wound and your hands with soap and water after  removal to prevent catching any tick disease.  Apply an over the counter antibiotic ointment (e.g. bacitracin) to the bite once. 5. Expected Course: Tick bites normally don't itch or hurt. That's why they often go unnoticed. 6. Call Your Doctor If:  o You can't remove the tick or the tick's head o Fever, a severe head ache, or rash occur in the next 2 weeks o Bite begins to look infected o Lyme's disease is common in your area o You have not had a tetanus in the last 10 years o Your current symptoms become worse    MAKE SURE YOU   Understand these instructions.  Will watch your condition.  Will get help right away if you are not doing well or get worse.   Thank you for choosing an e-visit.  Your e-visit answers were reviewed by a board certified advanced clinical practitioner to complete your personal care plan. Depending upon the condition, your plan could have included both over the counter or prescription medications. Please review your pharmacy choice. If there is a problem you may use MyChart messaging to have the prescription routed to another pharmacy. Your safety is important to [place]. If you have drug allergies check your prescription carefully.   You can use MyChart to ask questions about today's visit, request a non-urgent call back, or ask for a work or school excuse for 24 hours related to this e-Visit. If it has been greater than 24 hours you will need to follow up with your provider, or enter a new e-Visit to address those concerns.  You will get an email in the next two days asking about your experience. I hope  that your e-visit has been valuable and will speed your recovery  Meds ordered this encounter  Medications  . doxycycline (VIBRA-TABS) 100 MG tablet    Sig: Take 1 tablet (100 mg total) by mouth 2 (two) times daily for 21 days.    Dispense:  42 tablet    Refill:  0

## 2020-09-22 NOTE — Telephone Encounter (Signed)
[facility] - Client TELEPHONE ADVICE RECORD [facility] Patient Name: Lisa Salinas [facility] Gender: Female DOB: Feb 17, 1956 Age: 65 Y 36 M 17 D Return Phone Number: [phone] (Primary) Address: City/ State/ Zip: [place] [address] Client [facility] - Client Client Site [place] - Day Physician Eliezer Lofts - MD Contact Type Call Who Is Calling Patient / Member / Family / Caregiver Call Type Triage / Clinical Relationship To Patient Self Return Phone Number [phone] (Primary) Chief Complaint Tick Bite Reason for Call Symptomatic / Request for [place] a tick on the inside of her thigh, site is not red with a bulls eye around it. Office has no appts today. Translation No Nurse Assessment Nurse: Markus Daft, RN, [place] Date/Time (Eastern Time): 09/22/2020 9:02:51 AM Confirm and document reason for call. If symptomatic, describe symptoms. ---Caller states that she had a tick on the inside of her left thigh this am. Site with a bulls eye ring around it. Does the patient have any new or worsening symptoms? ---Yes Will a triage be completed? ---Yes Related visit to physician within the last 2 weeks? ---No Does the PT have any chronic conditions? (i.e. diabetes, asthma, this includes High risk factors for pregnancy, etc.) ---No Is this a behavioral health or substance abuse call? ---No Guidelines Guideline Title Affirmed Question Affirmed Notes Nurse Date/Time (Eastern Time) Tick Bite Red ring or bull's-eye rash occurs at tick bite [place], RN, [facility] 09/22/2020 9:04:07 AM Disp. Time Eilene Ghazi Time) Disposition Final User 09/22/2020 9:07:39 AM See PCP within 24 Hours Yes Markus Daft, RN, Sherre Poot Caller Disagree/Comply Comply PLEASE NOTE: All timestamps contained within this report are represented as [place] Standard Time. CONFIDENTIALTY NOTICE: This fax transmission is  intended only for the addressee. It contains information that is legally privileged, confidential or otherwise protected from use or disclosure. If you are not the intended recipient, you are strictly prohibited from reviewing, disclosing, copying using or disseminating any of this information or taking any action in reliance on or regarding this information. If you have received this fax in error, please notify us immediately by telephone so that we can arrange for its return to [place]. Phone: [phone], Toll-Free: [phone], Fax: [phone] Page: 2 of 2 Call Id: [id number] [place] Understands Yes PreDisposition Search internet for information Care Advice Given Per Guideline SEE PCP WITHIN 24 HOURS: * IF OFFICE WILL BE OPEN: You need to be examined within the next 24 hours. Call your doctor (or NP/PA) when the office opens and make an appointment. ANTIBIOTIC OINTMENT: * Apply antibiotic ointment (OTC) to the infected area 3 times per day. CALL BACK IF: * Fever occurs * You become worse CARE ADVICE given per Tick Bites (Adult) guideline. After Care Instructions Given Call Event Type User Date / Time Description Education document email Jackqulyn Livings 09/22/2020 9:08:22 AM [facility] Instructions Comments User: Mayford Knife, RN Date/Time Eilene Ghazi Time): 09/22/2020 9:09:09 AM RN advised that as there no appts, she can arrange online appt but should be seen sooner than her Thursday appt. Info. sent. Caller agreeable and verbalized understanding. Referrals REFERRED TO PCP OFFICE

## 2020-09-22 NOTE — Telephone Encounter (Signed)
Please see E visit with Apolonio Schneiders FNP; pt was to keep in office appt with Dr Diona Browner on 09/24/20 as FU to evisit appt. Sending note to DR [facility].

## 2020-09-22 NOTE — Telephone Encounter (Signed)
reviewed

## 2020-09-24 ENCOUNTER — Ambulatory Visit: Payer: Medicare PPO | Admitting: Family Medicine

## 2020-09-29 ENCOUNTER — Other Ambulatory Visit: Payer: Self-pay

## 2020-09-29 ENCOUNTER — Encounter: Payer: Self-pay | Admitting: Family Medicine

## 2020-09-29 ENCOUNTER — Ambulatory Visit: Payer: Medicare PPO | Admitting: Family Medicine

## 2020-09-29 VITALS — BP 100/62 | HR 80 | Temp 98.2°F | Ht 65.0 in | Wt 166.5 lb

## 2020-09-29 DIAGNOSIS — S70362A Insect bite (nonvenomous), left thigh, initial encounter: Secondary | ICD-10-CM | POA: Diagnosis not present

## 2020-09-29 DIAGNOSIS — W57XXXA Bitten or stung by nonvenomous insect and other nonvenomous arthropods, initial encounter: Secondary | ICD-10-CM

## 2020-09-29 DIAGNOSIS — S70369A Insect bite (nonvenomous), unspecified thigh, initial encounter: Secondary | ICD-10-CM

## 2020-09-29 MED ORDER — TRIAMCINOLONE ACETONIDE 0.1 % EX CREA: 1.0000 "application " | TOPICAL_CREAM | Freq: Two times a day (BID) | CUTANEOUS | 0 refills | Status: DC

## 2020-09-29 NOTE — Progress Notes (Signed)
Patient ID: Lisa Salinas, female    DOB: 1955-06-18, 65 y.o.   MRN: [id number]  This visit was conducted in person.  BP 100/62   Pulse 80   Temp 98.2 F (36.8 C) (Temporal)   Ht 5\' 5"  (1.651 m)   Wt 166 lb 8 oz (75.5 kg)   SpO2 98%   BMI 27.71 kg/m    CC: Chief Complaint  Patient presents with   Insect Bite    Tick Bite (E-Visit 09/22/20)    Subjective:   HPI: Lisa Salinas is a 65 y.o. female presenting on 09/29/2020 for Insect Bite (Tick Bite (E-Visit 09/22/20))  Had Evisit on 09/22/20 for tick bite Treated with doxycycline empirically x 21 days.  She has had some headache in the last 3 morning.. lasted a few minutes.  No fever, no joint pain, no neuro changes. No flu like symptoms, no neck stiffness.  Darker red area on  left groin... no central clearing.  Area is itchy.  She is applying hydrocortisone cream.        Relevant past medical, surgical, family and social history reviewed and updated as indicated. Interim medical history since our last visit reviewed. Allergies and medications reviewed and updated. Outpatient Medications Prior to Visit  Medication Sig Dispense Refill   aspirin EC 81 MG tablet Take 1 tablet (81 mg total) by mouth daily. 90 tablet 3   atorvastatin (LIPITOR) 10 MG tablet TAKE 1 TABLET BY MOUTH EVERY DAY 90 tablet 3   calcium citrate-vitamin D (CITRACAL+D) 315-200 MG-UNIT per tablet Take 1 tablet by mouth 2 (two) times daily.     doxycycline (VIBRA-TABS) 100 MG tablet Take 1 tablet (100 mg total) by mouth 2 (two) times daily for 21 days. 42 tablet 0   metoprolol succinate (TOPROL-XL) 25 MG 24 hr tablet TAKE 1/2 TABLET BY MOUTH EVERY DAY 45 tablet 4   sertraline (ZOLOFT) 100 MG tablet TAKE 1 TABLET BY MOUTH EVERY DAY 90 tablet 1   traZODone (DESYREL) 50 MG tablet TAKE 0.5-1 TABLETS BY MOUTH AT BEDTIME AS NEEDED FOR SLEEP. 90 tablet 0   nitroGLYCERIN (NITROSTAT) 0.4 MG SL tablet Place 1 tablet (0.4 mg total) under the tongue every 5  (five) minutes as needed for chest pain. 25 tablet 3   Ascorbic Acid (VITAMIN C PO) Take 1 tablet by mouth daily as needed (Cold or cough).     azithromycin (ZITHROMAX) 250 MG tablet Take 2 tablets by mouth today, then 1 tablet daily for 4 additional days. 6 tablet 0   chlorpheniramine-HYDROcodone (TUSSIONEX PENNKINETIC ER) 10-8 MG/5ML SUER Take 5 mLs by mouth every 12 (twelve) hours as needed for cough. 50 mL 0   Dextromethorphan HBr (DELSYM PO) Take by mouth as needed (for cough).     No facility-administered medications prior to visit.     Per HPI unless specifically indicated in ROS section below Review of Systems  Constitutional:  Negative for fatigue and fever.  HENT:  Negative for ear pain.   Eyes:  Negative for pain.  Respiratory:  Negative for chest tightness and shortness of breath.   Cardiovascular:  Negative for chest pain, palpitations and leg swelling.  Gastrointestinal:  Negative for abdominal pain.  Genitourinary:  Negative for dysuria.  Objective:  BP 100/62   Pulse 80   Temp 98.2 F (36.8 C) (Temporal)   Ht 5\' 5"  (1.651 m)   Wt 166 lb 8 oz (75.5 kg)   SpO2 98%   BMI 27.71  kg/m   Wt Readings from Last 3 Encounters:  09/29/20 166 lb 8 oz (75.5 kg)  09/08/20 168 lb (76.2 kg)  07/07/20 168 lb (76.2 kg)      Physical Exam Constitutional:      General: She is not in acute distress.    Appearance: Normal appearance. She is well-developed. She is not ill-appearing or toxic-appearing.  HENT:     Head: Normocephalic.     Right Ear: Hearing, tympanic membrane, ear canal and external ear normal. Tympanic membrane is not erythematous, retracted or bulging.     Left Ear: Hearing, tympanic membrane, ear canal and external ear normal. Tympanic membrane is not erythematous, retracted or bulging.     Nose: No mucosal edema or rhinorrhea.     Right Sinus: No maxillary sinus tenderness or frontal sinus tenderness.     Left Sinus: No maxillary sinus tenderness or frontal  sinus tenderness.     Mouth/Throat:     Pharynx: Uvula midline.  Eyes:     General: Lids are normal. Lids are everted, no foreign bodies appreciated.     Conjunctiva/sclera: Conjunctivae normal.     Pupils: Pupils are equal, round, and reactive to light.  Neck:     Thyroid: No thyroid mass or thyromegaly.     Vascular: No carotid bruit.     Trachea: Trachea normal.  Cardiovascular:     Rate and Rhythm: Normal rate and regular rhythm.     Pulses: Normal pulses.     Heart sounds: Normal heart sounds, S1 normal and S2 normal. No murmur heard.   No friction rub. No gallop.  Pulmonary:     Effort: Pulmonary effort is normal. No tachypnea or respiratory distress.     Breath sounds: Normal breath sounds. No decreased breath sounds, wheezing, rhonchi or rales.  Abdominal:     General: Bowel sounds are normal.     Palpations: Abdomen is soft.     Tenderness: There is no abdominal tenderness.  Musculoskeletal:     Cervical back: Normal range of motion and neck supple.  Skin:    General: Skin is warm and dry.     Findings: No rash.     Comments: Darker red patch on  left groin... no central clearing. Dry flakey, no central pore, no heat  Neurological:     Mental Status: She is alert.  Psychiatric:        Mood and Affect: Mood is not anxious or depressed.        Speech: Speech normal.        Behavior: Behavior normal. Behavior is cooperative.        Thought Content: Thought content normal.        Judgment: Judgment normal.      Results for orders placed or performed during the hospital encounter of 06/11/20  Cytology - Non PAP; RT LOBE THYROID  Result Value Ref Range   CYTOLOGY - NON GYN      CYTOLOGY - NON PAP CASE: [id number] PATIENT: Lisa Salinas Non-Gynecological Cytology Report     Clinical History: TI-RADS total points 4 Specimen Submitted:  A. THYROID GLAND, RIGHT LOBE, RUP - NODULE #4, FINE NEEDLE ASPIRATION:   FINAL MICROSCOPIC DIAGNOSIS: - Consistent with  benign follicular nodule (Bethesda category II)  SPECIMEN ADEQUACY: Satisfactory for evaluation  GROSS: Received is/are 30 cc's of light peach cytolyt solution and 6 slides in ethyl alcohol (TB:tb) Smears: 6 Concentration Method (Thin Prep): 1 Cell Block: Attempted, not obtained Additional Studies:  Afirma collected     Final Diagnosis performed by Gillie Manners, MD.   Electronically signed 06/12/2020 Technical and / or Professional components performed at [facility]. [facility], [place] [address] [place], [place] [address].  Immunohistochemistry Technical component (if applicable) was performed at [facility]. [place], [place] [address].   IMMUNOHISTOCHEMISTRY DISCLAIMER (if applicable): Some of these immunohistochemical stains may have been developed and the performance characteristics determine by [facility]. Some may not have been cleared or approved by the U.S. Food and Drug Administration. The FDA has determined that such clearance or approval is not necessary. This test is used for clinical purposes. It should not be regarded as investigational or for research. This laboratory is certified under the [place] (CLIA-88) as qualified to perform high complexity clinical laboratory testing.  The controls stained appropriately.     This visit occurred during the SARS-CoV-2 public health emergency.  Safety protocols were in place, including screening questions prior to the visit, additional usage of staff PPE, and extensive cleaning of exam room while observing appropriate contact time as indicated for disinfecting solutions.   COVID 19 screen:  No recent travel or known exposure to COVID19 The patient denies respiratory symptoms of COVID 19 at this time. The importance of social distancing was discussed today.   Assessment and Plan    Problem List Items Addressed  This Visit     Tick bite of thigh - Primary    Acute, improving Complete 10 days of doxycycline.  Wear  sunscreen.  Can apply topical steroid cream prescription for ithcing. Meds ordered this encounter  Medications   DISCONTD: triamcinolone cream (KENALOG) 0.1 %    Sig: Apply 1 application topically 2 (two) times daily.    Dispense:  30 g    Refill:  0           Eliezer Lofts, MD

## 2020-09-29 NOTE — Patient Instructions (Signed)
Complete 10 days of doxycycline.  Wear  sunscreen.  Can apply topical steroid cream prescription for ithcing.

## 2020-10-08 ENCOUNTER — Ambulatory Visit: Payer: Medicare PPO | Admitting: Internal Medicine

## 2020-11-02 ENCOUNTER — Encounter: Payer: Self-pay | Admitting: Internal Medicine

## 2020-11-02 NOTE — Progress Notes (Signed)
Follow-up Outpatient Visit Date: 11/04/2020  Primary Care Provider: Jinny Sanders, MD [place] [address]  Chief Complaint: Follow-up coronary artery disease with stable angina  HPI:  Lisa Salinas is a 65 y.o. female with history of coronary artery disease with 60-70% proximal-mid LCx stenosis significant by FFR (0.76 in 02/2019) being managed medically, NSVT, recurrent breast cancer s/p lumpectomy and chemoradiation, and depression/anxiety who presents for follow-up of coronary artery disease.  She was previously followed in [place] by Truitt Merle, NP, having last been seen in 04/2020.  She has asked to transition her care to the [place] office.  She was doing well at the time of her last visit.  Today, Ms. Sivak reports that she has been under more stress related to her daughter's ongoing divorce.  She reports 1 episode of a "shooting" left-sided chest pain that occurred last week while at rest.  It lasted about 10 minutes before resolving on its own.  She notes stable exertional dyspnea and mild pressure in her chest when walking.  This has been unchanged from prior visits.  She notes occasional palpitations that seem to be exacerbated by stress.  She continues to walk 1.5 to 2 miles several days a week without limitations.  She has not needed any sublingual nitroglycerin.  She has occasional cramps in her legs as well as throbbing behind her knees at night.  She denies claudication as well as edema, and orthopnea.  --------------------------------------------------------------------------------------------------  Past Medical History:  Diagnosis Date   Anxiety state, unspecified    Basal cell carcinoma (BCC) of face    Breast cancer, right breast ([facility]) 10/20/2004   S/P lumpectomy (04/2005) and chemo (03/2005)   Coronary artery disease 02/2019   60-70% prox-mid LCx stenosi (FFR 0.76) - medical management   Depression    Dizziness and giddiness     Dysrhythmia    Family history of breast cancer    Personal history of chemotherapy 03/2005   Personal history of radiation therapy 05/2005   Past Surgical History:  Procedure Laterality Date   ABDOMINAL HYSTERECTOMY     "left my ovaries; no cancer"   BASAL CELL CARCINOMA EXCISION Right    side of face    BREAST BIOPSY Bilateral    "2 on the left; 1 on the right; all benign"   BREAST BIOPSY Right 10/20/2004   malignant   BREAST EXCISIONAL BIOPSY Left    BREAST LUMPECTOMY Right 04/2005   COLONOSCOPY     LEFT HEART CATH AND CORONARY ANGIOGRAPHY N/A 02/26/2019   Procedure: LEFT HEART CATH AND CORONARY ANGIOGRAPHY;  Surgeon: Belva Crome, MD;  Location: [facility];  Service: Cardiovascular;  Laterality: N/A;   MASTECTOMY COMPLETE / SIMPLE Right 10/17/2016   MASTECTOMY W/ SENTINEL NODE BIOPSY Right 10/17/2016   Procedure: RIGHT MASTECTOMY WITH SENTINEL LYMPH NODE BIOPSY;  Surgeon: Fanny Skates, MD;  Location: [facility];  Service: General;  Laterality: Right;   TONSILLECTOMY  Childhood     Recent CV Pertinent Labs: Lab Results  Component Value Date   CHOL 117 01/31/2020   CHOL 115 11/04/2019   HDL 55.00 01/31/2020   HDL 53 11/04/2019   LDLCALC 52 01/31/2020   LDLCALC 48 11/04/2019   TRIG 51.0 01/31/2020   CHOLHDL 2 01/31/2020   K 4.1 03/02/2020   BUN 16 03/02/2020   BUN 15 11/04/2019   CREATININE 0.83 03/02/2020    Past medical and surgical history were reviewed and updated in EPIC.  Current Meds  Medication Sig   aspirin EC 81 MG tablet Take 1 tablet (81 mg total) by mouth daily.   atorvastatin (LIPITOR) 10 MG tablet TAKE 1 TABLET BY MOUTH EVERY DAY   calcium citrate-vitamin D (CITRACAL+D) 315-200 MG-UNIT per tablet Take 1 tablet by mouth 2 (two) times daily.   metoprolol succinate (TOPROL-XL) 25 MG 24 hr tablet TAKE 1/2 TABLET BY MOUTH EVERY DAY   nitroGLYCERIN (NITROSTAT) 0.4 MG SL tablet Place 1 tablet (0.4 mg total) under the tongue every 5 (five) minutes as  needed for chest pain.   sertraline (ZOLOFT) 100 MG tablet TAKE 1 TABLET BY MOUTH EVERY DAY   traZODone (DESYREL) 50 MG tablet TAKE 0.5-1 TABLETS BY MOUTH AT BEDTIME AS NEEDED FOR SLEEP.   triamcinolone cream (KENALOG) 0.1 % Apply 1 application topically 2 (two) times daily.    Allergies: Betadine [povidone iodine] and Sulfonamide derivatives  Social History   Tobacco Use   Smoking status: Never   Smokeless tobacco: Never  Vaping Use   Vaping Use: Never used  Substance Use Topics   Alcohol use: No   Drug use: No    Family History  Problem Relation Age of Onset   Heart disease Mother    Hypertension Mother    Hyperlipidemia Mother    Goiter Mother    Cancer Father 17       lung, smoker   Breast cancer Maternal Aunt 45   Breast cancer Paternal Aunt 57   Breast cancer Maternal Aunt 19   Breast cancer Cousin 55       paternal first cousin    Review of Systems: A 12-system review of systems was performed and was negative except as noted in the HPI.  --------------------------------------------------------------------------------------------------  Physical Exam: BP 110/80 (BP Location: Left Arm, Patient Position: Sitting, Cuff Size: Normal)   Pulse 76   Ht 5\' 5"  (1.651 m)   Wt 165 lb (74.8 kg)   SpO2 97%   BMI 27.46 kg/m   General:  NAD. Neck: No JVD or HJR. Lungs: Clear to auscultation bilaterally without wheezes or crackles. Heart: Regular rate and rhythm without murmurs, rubs, or gallops. Abdomen: Soft, nontender, nondistended. Extremities: No lower extremity edema.  EKG: Normal sinus rhythm with borderline low voltage.  Otherwise, no significant abnormality.  No significant change from prior tracing on 03/17/2020.  Lab Results  Component Value Date   WBC 6.1 03/02/2020   HGB 13.5 03/02/2020   HCT 42.1 03/02/2020   MCV 91.5 03/02/2020   PLT 186 03/02/2020    Lab Results  Component Value Date   NA 141 03/02/2020   K 4.1 03/02/2020   CL 105  03/02/2020   CO2 29 03/02/2020   BUN 16 03/02/2020   CREATININE 0.83 03/02/2020   GLUCOSE 86 03/02/2020   ALT 11 03/02/2020    Lab Results  Component Value Date   CHOL 117 01/31/2020   HDL 55.00 01/31/2020   LDLCALC 52 01/31/2020   TRIG 51.0 01/31/2020   CHOLHDL 2 01/31/2020    --------------------------------------------------------------------------------------------------  ASSESSMENT AND PLAN: Coronary artery disease with stable angina: Ms. Sanderlin reports a single episode of atypical chest pain at rest but otherwise stable exertional dyspnea and chest pressure unchanged from prior visits.  Catheterization in 02/2019 showed single-vessel CAD with 60 to 70% proximal/mid LCx disease that was mildly positive by FFR.  We discussed continuing current medical therapy, escalation of antianginal therapy, and repeating catheterization.  As Ms. Feldt believes that stress is predominantly  driving her symptoms, she would like to defer any further testing and medication changes at this time.  We will continue secondary prevention with aspirin and atorvastatin as well as antianginal therapy with low-dose metoprolol.  Ms. Buege will contact us if her symptoms worsen.  Hyperlipidemia: LDL well controlled on most recent lipid panel in 01/2020.  Continue atorvastatin 20 mg daily.  Palpitations: Stable to slightly more frequent, likely exacerbated by emotional stressors.  Continue low-dose metoprolol.  Follow-up: Return to clinic in 4 months.  Nelva Bush, MD 11/04/2020 9:18 AM

## 2020-11-04 ENCOUNTER — Encounter: Payer: Self-pay | Admitting: Internal Medicine

## 2020-11-04 ENCOUNTER — Ambulatory Visit: Payer: Medicare PPO | Admitting: Internal Medicine

## 2020-11-04 ENCOUNTER — Other Ambulatory Visit: Payer: Self-pay

## 2020-11-04 VITALS — BP 110/80 | HR 76 | Ht 65.0 in | Wt 165.0 lb

## 2020-11-04 DIAGNOSIS — E785 Hyperlipidemia, unspecified: Secondary | ICD-10-CM

## 2020-11-04 DIAGNOSIS — R002 Palpitations: Secondary | ICD-10-CM

## 2020-11-04 DIAGNOSIS — I25118 Atherosclerotic heart disease of native coronary artery with other forms of angina pectoris: Secondary | ICD-10-CM

## 2020-11-04 NOTE — Patient Instructions (Signed)
Medication Instructions:  Your physician recommends that you continue on your current medications as directed. Please refer to the Current Medication list given to you today.  *If you need a refill on your cardiac medications before your next appointment, please call your pharmacy*   Lab Work:  None ordered  Testing/Procedures:  None ordered   Follow-Up: At [facility], you and your health needs are our priority.  As part of our continuing mission to provide you with exceptional heart care, we have created designated Provider Care Teams.  These Care Teams include your primary Cardiologist (physician) and Advanced Practice Providers (APPs -  Physician Assistants and Nurse Practitioners) who all work together to provide you with the care you need, when you need it.  We recommend signing up for the patient portal called "MyChart".  Sign up information is provided on this After Visit Summary.  MyChart is used to connect with patients for Virtual Visits (Telemedicine).  Patients are able to view lab/test results, encounter notes, upcoming appointments, etc.  Non-urgent messages can be sent to your provider as well.   To learn more about what you can do with MyChart, go to [email or web address].    Your next appointment:   4 month(s)  The format for your next appointment:   In Person  Provider:   You may see Dr. Harrell Gave End or one of the following Advanced Practice Providers on your designated Care Team:   Murray Hodgkins, NP Christell Faith, PA-C Marrianne Mood, PA-C Cadence Kathlen Mody, [place]   Other Instructions  If your leg and/or chest pain worsens please contact our office.

## 2020-11-05 ENCOUNTER — Other Ambulatory Visit: Payer: Self-pay | Admitting: Family Medicine

## 2020-11-05 NOTE — Telephone Encounter (Signed)
Last office visit 09/29/2020 for insect bite.  Last refilled 09/29/2020 for 30 g with no refills.  No future appointments with PCP.

## 2020-11-16 ENCOUNTER — Other Ambulatory Visit: Payer: Self-pay

## 2020-11-16 MED ORDER — ATORVASTATIN CALCIUM 10 MG PO TABS: 10.0000 mg | ORAL_TABLET | Freq: Every day | ORAL | 3 refills | Status: DC

## 2020-12-09 DIAGNOSIS — W57XXXA Bitten or stung by nonvenomous insect and other nonvenomous arthropods, initial encounter: Secondary | ICD-10-CM | POA: Insufficient documentation

## 2020-12-09 DIAGNOSIS — S70369A Insect bite (nonvenomous), unspecified thigh, initial encounter: Secondary | ICD-10-CM | POA: Insufficient documentation

## 2020-12-09 NOTE — Assessment & Plan Note (Signed)
Acute, improving Complete 10 days of doxycycline.  Wear  sunscreen.  Can apply topical steroid cream prescription for ithcing. Meds ordered this encounter  Medications  . DISCONTD: triamcinolone cream (KENALOG) 0.1 %    Sig: Apply 1 application topically 2 (two) times daily.    Dispense:  30 g    Refill:  0

## 2021-02-11 ENCOUNTER — Encounter: Payer: Self-pay | Admitting: Family Medicine

## 2021-02-11 ENCOUNTER — Other Ambulatory Visit: Payer: Self-pay

## 2021-02-11 ENCOUNTER — Telehealth (INDEPENDENT_AMBULATORY_CARE_PROVIDER_SITE_OTHER): Payer: Medicare PPO | Admitting: Family Medicine

## 2021-02-11 VITALS — Ht 65.0 in | Wt 165.0 lb

## 2021-02-11 DIAGNOSIS — B9689 Other specified bacterial agents as the cause of diseases classified elsewhere: Secondary | ICD-10-CM | POA: Diagnosis not present

## 2021-02-11 DIAGNOSIS — J329 Chronic sinusitis, unspecified: Secondary | ICD-10-CM

## 2021-02-11 MED ORDER — AMOXICILLIN 500 MG PO CAPS: 1000.0000 mg | ORAL_CAPSULE | Freq: Two times a day (BID) | ORAL | 0 refills | Status: DC

## 2021-02-11 NOTE — Progress Notes (Signed)
VIRTUAL VISIT Due to national recommendations of social distancing due to [place] 19, a virtual visit is felt to be most appropriate for this patient at this time.   I connected with the patient on 02/11/21 at 10:40 AM EDT by virtual telehealth platform and verified that I am speaking with the correct person using two identifiers.   I discussed the limitations, risks, security and privacy concerns of performing an evaluation and management service by  virtual telehealth platform and the availability of in person appointments. I also discussed with the patient that there may be a patient responsible charge related to this service. The patient expressed understanding and agreed to proceed.  Patient location: Home Provider Location: [place] Participants: Eliezer Lofts and [name]   Chief Complaint  Patient presents with   nasal drainage        Dental Pain    History of Present Illness:  65 year old female with history of  CAD, recurrent breast cancer ( not currently on treatment) presents with new onset nasal congestion and dental pain,.   Date of onset: 2 weeks, but took a turn for the worse 5-6 days ago.   Started with yellow nasal discharger 2 weeks ago.. since then increased nasal congestion.. changed to green discharge.  Eye hurt, face hurts at area glasses sit. No ear pain.  No fever, occ chills.  She has started coughing, productive. No SOB,  one episode of  wheeze several days ago. Teeth hurting on top, left greater than left. Left eye more swollen than usual.   She has tried sudafed and Nyquil       COVID 19 screen COVID testing: Neg COVID test 4 days ago Return precautions given. COVID vaccine: 04/24/2020 , 07/23/2019 , 06/27/2019 COVID exposure: No recent travel or known exposure to [place]  The importance of social distancing was discussed today.    Review of Systems  Constitutional:  Negative for chills and fever.  HENT:  Positive for congestion  and sinus pain. Negative for ear pain.   Eyes:  Positive for pain. Negative for redness.  Respiratory:  Positive for cough. Negative for shortness of breath.   Cardiovascular:  Negative for chest pain, palpitations and leg swelling.  Gastrointestinal:  Negative for abdominal pain, blood in stool, constipation, diarrhea, nausea and vomiting.  Genitourinary:  Negative for dysuria.  Musculoskeletal:  Negative for falls and myalgias.  Skin:  Negative for rash.  Neurological:  Negative for dizziness.  Psychiatric/Behavioral:  Negative for depression. The patient is not nervous/anxious.      Past Medical History:  Diagnosis Date   Anxiety state, unspecified    Basal cell carcinoma (BCC) of face    Breast cancer, right breast ([facility]) 10/20/2004   S/P lumpectomy (04/2005) and chemo (03/2005)   Coronary artery disease 02/2019   60-70% prox-mid LCx stenosi (FFR 0.76) - medical management   Depression    Dizziness and giddiness    Dysrhythmia    Family history of breast cancer    Personal history of chemotherapy 03/2005   Personal history of radiation therapy 05/2005    reports that she has never smoked. She has never used smokeless tobacco. She reports that she does not drink alcohol and does not use drugs.   Current Outpatient Medications:    aspirin EC 81 MG tablet, Take 1 tablet (81 mg total) by mouth daily., Disp: 90 tablet, Rfl: 3   atorvastatin (LIPITOR) 10 MG tablet, Take 1 tablet (10 mg total)  by mouth daily., Disp: 90 tablet, Rfl: 3   calcium citrate-vitamin D (CITRACAL+D) 315-200 MG-UNIT per tablet, Take 1 tablet by mouth 2 (two) times daily., Disp: , Rfl:    metoprolol succinate (TOPROL-XL) 25 MG 24 hr tablet, TAKE 1/2 TABLET BY MOUTH EVERY DAY, Disp: 45 tablet, Rfl: 4   nitroGLYCERIN (NITROSTAT) 0.4 MG SL tablet, Place 1 tablet (0.4 mg total) under the tongue every 5 (five) minutes as needed for chest pain., Disp: 25 tablet, Rfl: 3   sertraline (ZOLOFT) 100 MG tablet, TAKE 1 TABLET  BY MOUTH EVERY DAY, Disp: 90 tablet, Rfl: 1   traZODone (DESYREL) 50 MG tablet, TAKE 0.5-1 TABLETS BY MOUTH AT BEDTIME AS NEEDED FOR SLEEP., Disp: 90 tablet, Rfl: 0   triamcinolone cream (KENALOG) 0.1 %, APPLY TO AFFECTED AREA TWICE A DAY, Disp: 30 g, Rfl: 0   Observations/Objective: Height 5\' 5"  (1.651 m), weight 165 lb (74.8 kg).  Physical Exam  Physical Exam Constitutional:      General: The patient is not in acute distress. Pulmonary:     Effort: Pulmonary effort is normal. No respiratory distress.  Neurological:     Mental Status: The patient is alert and oriented to person, place, and time.  Psychiatric:        Mood and Affect: Mood normal.        Behavior: Behavior normal.    Assessment and Plan Problem List Items Addressed This Visit     Bacterial sinusitis - Primary     Start nasal saline, as well flonase 2 sprays per nostril daily.  Start antibiotic  Given > 7-10 days of illness , chills and left side face pain.       Relevant Medications   amoxicillin (AMOXIL) 500 MG capsule      I discussed the assessment and treatment plan with the patient. The patient was provided an opportunity to ask questions and all were answered. The patient agreed with the plan and demonstrated an understanding of the instructions.   The patient was advised to call back or seek an in-person evaluation if the symptoms worsen or if the condition fails to improve as anticipated.     Eliezer Lofts, MD

## 2021-02-11 NOTE — Assessment & Plan Note (Signed)
Start nasal saline, as well flonase 2 sprays per nostril daily.  Start antibiotic  Given > 7-10 days of illness , chills and left side face pain.

## 2021-03-05 ENCOUNTER — Ambulatory Visit: Payer: Medicare PPO | Admitting: Internal Medicine

## 2021-04-06 ENCOUNTER — Other Ambulatory Visit: Payer: Self-pay | Admitting: Oncology

## 2021-04-06 DIAGNOSIS — Z1231 Encounter for screening mammogram for malignant neoplasm of breast: Secondary | ICD-10-CM

## 2021-04-27 IMAGING — US US THYROID
1 series · 15 of 25 positions shown · non-contrast
Comparison: None.

CLINICAL DATA: Palpable abnormality.

EXAM:
THYROID ULTRASOUND
TECHNIQUE: Ultrasound examination of the thyroid gland and adjacent soft
tissues was performed.

[Series 1: us thyroid · 0.06mm/px · 15 of 59 slices shown]
[im 1/59]
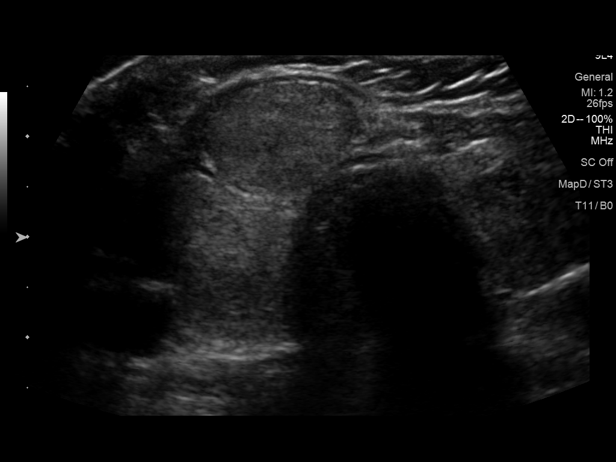
[im 5/59]
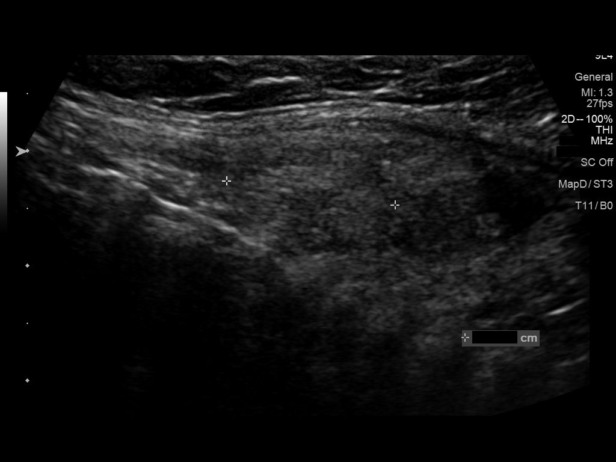
[im 10/59]
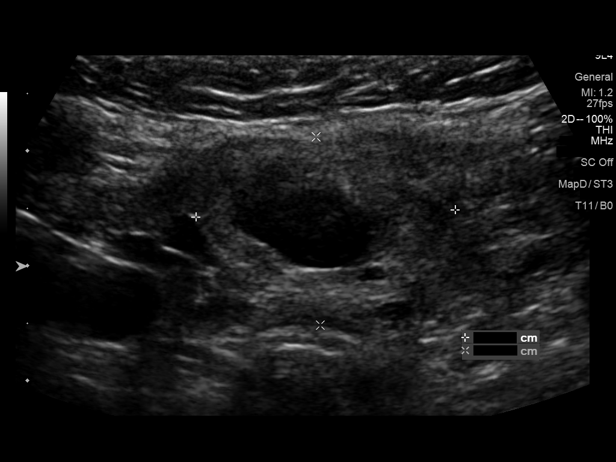
[im 13/59]
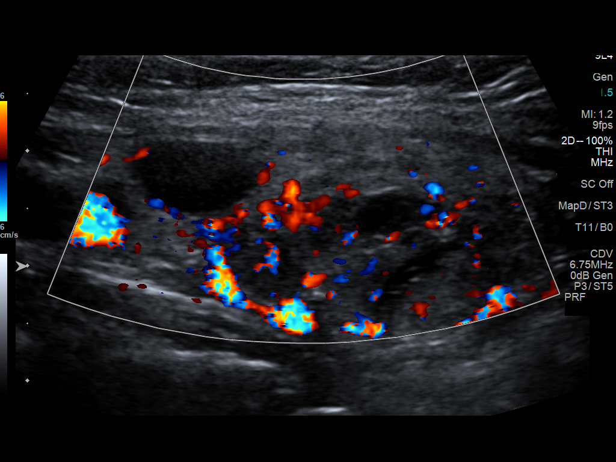
[im 17/59]
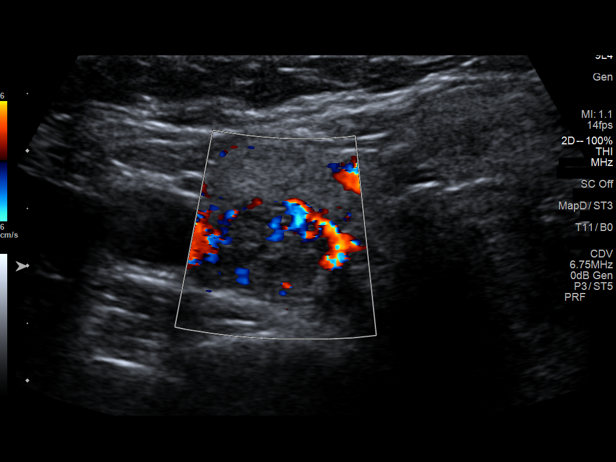
[im 22/59]
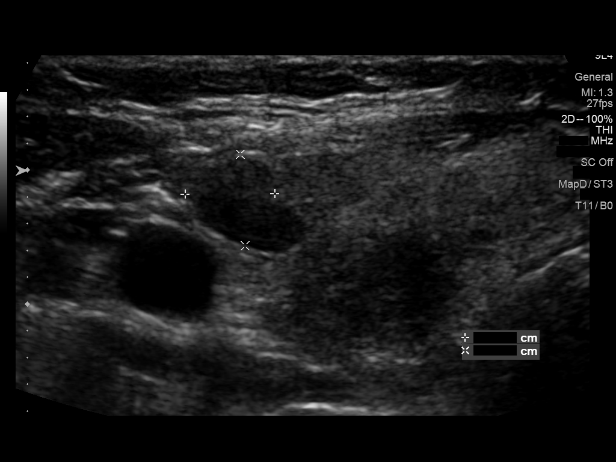
[im 25/59]
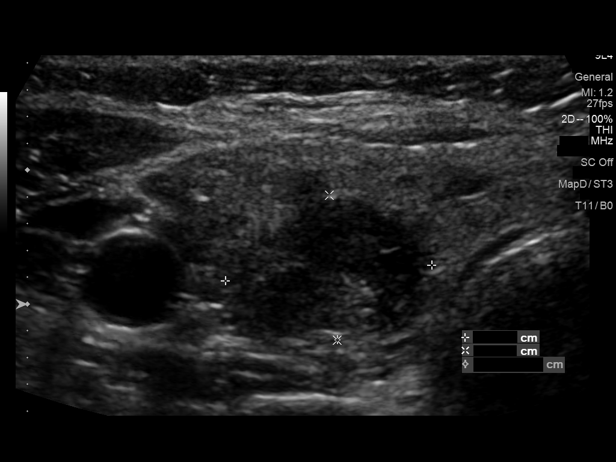
[im 30/59]
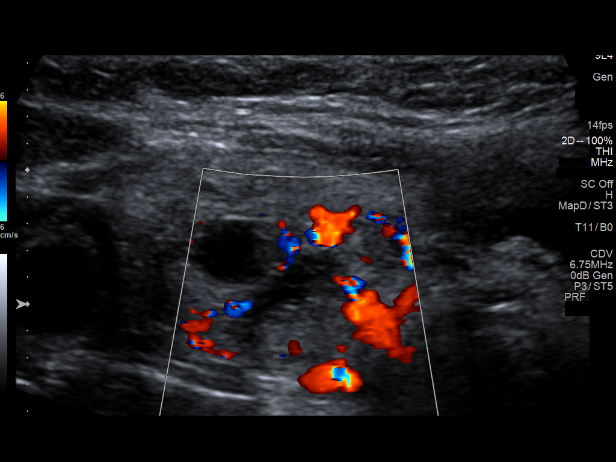
[im 34/59]
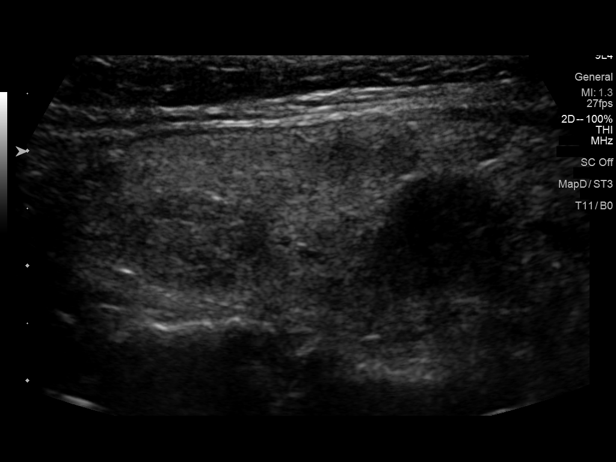
[im 37/59]
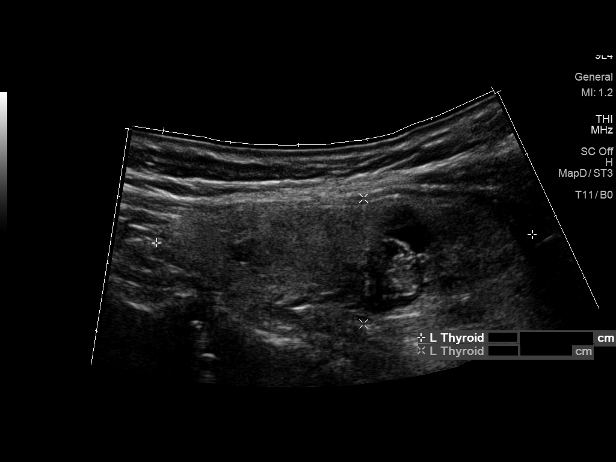
[im 42/59]
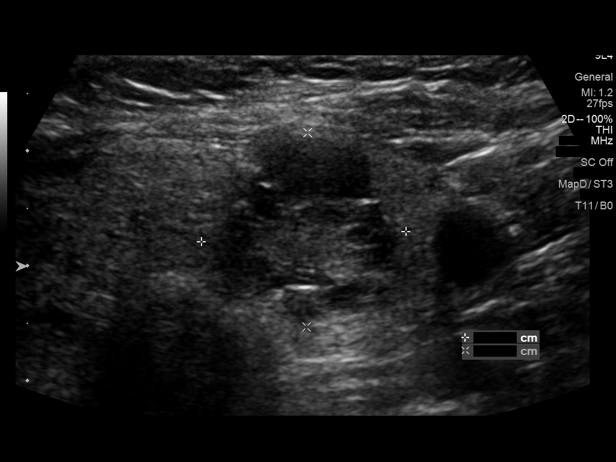
[im 46/59]
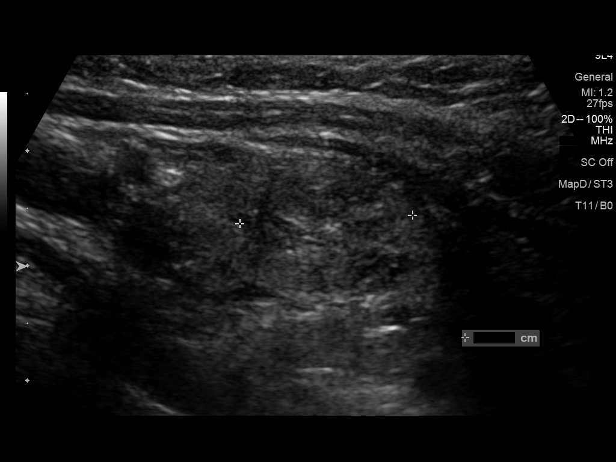
[im 49/59]
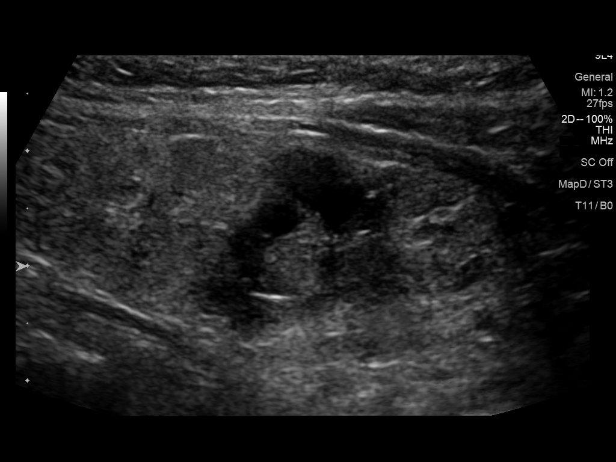
[im 54/59]
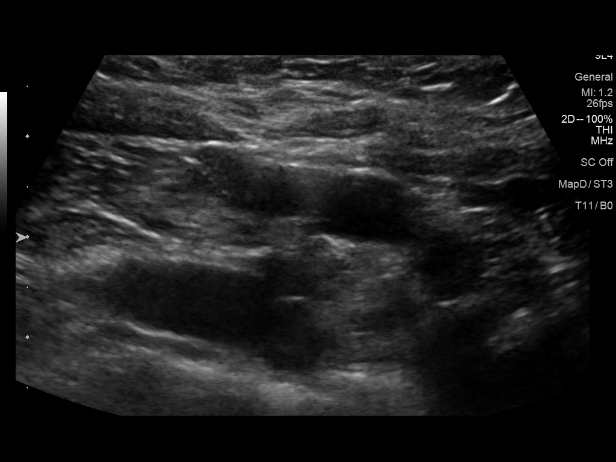
[im 59/59]
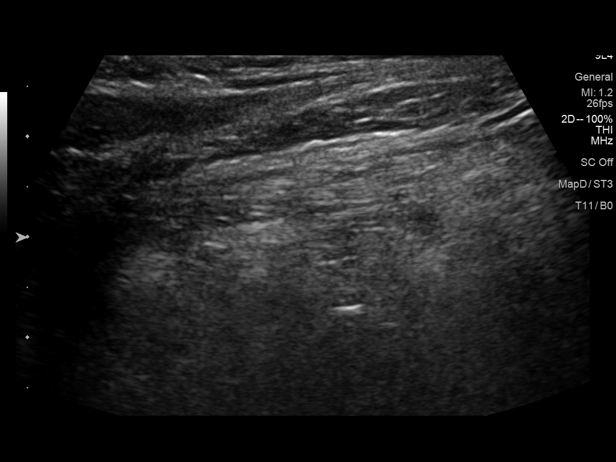

[15 of 25 positions shown; findings below may reference images not displayed]

FINDINGS: Parenchymal Echotexture: Moderately heterogenous

Isthmus: 0.9 cm

Right lobe: 5.3 x 2.0 x 2.6 cm

Left lobe: 5.5 x 1.8 x 2.3 cm

_________________________________________________________

Estimated total number of nodules >/= 1 cm: 6-10

Number of spongiform nodules >/=  2 cm not described below (TR1): 0

Number of mixed cystic and solid nodules >/= 1.5 cm not described
below (TR2): 0

_________________________________________________________

Nodule # 1:

Location: Isthmus; Mid

Maximum size: 1.5 cm; Other 2 dimensions: 1.4 x 1.0 cm

Composition: solid/almost completely solid (2)

Echogenicity: isoechoic (1)

Shape: not taller-than-wide (0)

Margins: ill-defined (0)

Echogenic foci: none (0)

ACR TI-RADS total points: 3.

ACR TI-RADS risk category: TR3 (3 points).

ACR TI-RADS recommendations:

*Given size (>/= 1.5 - 2.4 cm) and appearance, a follow-up
ultrasound in 1 year should be considered based on TI-RADS criteria.

_________________________________________________________

Nodule # 2:

Location: Isthmus; Inferior

Maximum size: 2.7 cm; Other 2 dimensions: 2.3 x 1.6 cm

Composition: mixed cystic and solid (1)

Echogenicity: isoechoic (1)

Shape: not taller-than-wide (0)

Margins: ill-defined (0)

Echogenic foci: none (0)

ACR TI-RADS total points: 2.

ACR TI-RADS risk category: TR2 (2 points).

ACR TI-RADS recommendations:

This nodule does NOT meet TI-RADS criteria for biopsy or dedicated
follow-up.

_________________________________________________________

Nodule # 3:

Location: Right; Superior

Maximum size: 1.2 cm; Other 2 dimensions: 1.0 x 0.9 cm

Composition: solid/almost completely solid (2)

Echogenicity: hypoechoic (2)

Shape: not taller-than-wide (0)

Margins: ill-defined (0)

Echogenic foci: none (0)

ACR TI-RADS total points: 4.

ACR TI-RADS risk category: TR4 (4-6 points).

ACR TI-RADS recommendations:

*Given size (>/= 1 - 1.4 cm) and appearance, a follow-up ultrasound
in 1 year should be considered based on TI-RADS criteria.

_________________________________________________________

Nodule # 4:

Location: Right; Superior

Maximum size: 2.2 cm; Other 2 dimensions: 1.6 x 1.0 cm

Composition: solid/almost completely solid (2)

Echogenicity: hypoechoic (2)

Shape: not taller-than-wide (0)

Margins: ill-defined (0)

Echogenic foci: none (0)

ACR TI-RADS total points: 4.

ACR TI-RADS risk category: TR4 (4-6 points).

ACR TI-RADS recommendations:

**Given size (>/= 1.5 cm) and appearance, fine needle aspiration of
this moderately suspicious nodule should be considered based on
TI-RADS criteria.

_________________________________________________________

Nodule # 5:

Location: Right; Mid

Maximum size: 1.2 cm; Other 2 dimensions: 0.7 x 0.7 cm

Composition: cannot determine (2)

Echogenicity: hypoechoic (2)

Shape: not taller-than-wide (0)

Margins: smooth (0)

Echogenic foci: none (0)

ACR TI-RADS total points: 4.

ACR TI-RADS risk category: TR4 (4-6 points).

ACR TI-RADS recommendations:

*Given size (>/= 1 - 1.4 cm) and appearance, a follow-up ultrasound
in 1 year should be considered based on TI-RADS criteria.

_________________________________________________________

Nodule # 6:

Location: Right; Mid

Maximum size: 1.5 cm; Other 2 dimensions: 1.5 x 1.1 cm

Composition: solid/almost completely solid (2)

Echogenicity: hypoechoic (2)

Shape: not taller-than-wide (0)

Margins: ill-defined (0)

Echogenic foci: none (0)

ACR TI-RADS total points: 4.

ACR TI-RADS risk category: TR4 (4-6 points).

ACR TI-RADS recommendations:

**Given size (>/= 1.5 cm) and appearance, fine needle aspiration of
this moderately suspicious nodule should be considered based on
TI-RADS criteria.

_________________________________________________________

Nodule # 7:

Location: Right; Inferior

Maximum size: 1.4 cm; Other 2 dimensions: 1.1 x 1.9 cm

Composition: solid/almost completely solid (2)

Echogenicity: hypoechoic (2)

Shape: not taller-than-wide (0)

Margins: ill-defined (0)

Echogenic foci: none (0)

ACR TI-RADS total points: 4.

ACR TI-RADS risk category: TR4 (4-6 points).

ACR TI-RADS recommendations:

*Given size (>/= 1 - 1.4 cm) and appearance, a follow-up ultrasound
in 1 year should be considered based on TI-RADS criteria.

_________________________________________________________

Nodule # 8:

Location: Left; Inferior

Maximum size: 2.1 cm; Other 2 dimensions: 1.8 x 1.7 cm

Composition: mixed cystic and solid (1)

Echogenicity: isoechoic (1)

Shape: not taller-than-wide (0)

Margins: lobulated/irregular (2)

Echogenic foci: none (0)

ACR TI-RADS total points: 4.

ACR TI-RADS risk category: TR4 (4-6 points).

ACR TI-RADS recommendations:

**Given size (>/= 1.5 cm) and appearance, fine needle aspiration of
this moderately suspicious nodule should be considered based on
TI-RADS criteria.

_________________________________________________________

Nodule # 9:

Location: Left; Inferior

Maximum size: 1.7 cm; Other 2 dimensions: 1.5 x 1.4 cm

Composition: solid/almost completely solid (2)

Echogenicity: isoechoic (1)

Shape: not taller-than-wide (0)

Margins: ill-defined (0)

Echogenic foci: none (0)

ACR TI-RADS total points: 3.

ACR TI-RADS risk category: TR3 (3 points).

ACR TI-RADS recommendations:

*Given size (>/= 1.5 - 2.4 cm) and appearance, a follow-up
ultrasound in 1 year should be considered based on TI-RADS criteria.

_________________________________________________________
IMPRESSION: 1. Multinodular goiter.
2. Right superior nodule (labeled 4, 2.2 cm) and left inferior
nodule (labeled 8, 2.1 cm) meet criteria (TI-RADS category 4) for
tissue sampling. Recommend fine-needle aspiration of both nodules.
3. Right mid nodule (labeled 6, 1.5 cm) with meet criteria for
tissue sampling, however is trumped by the larger nodules above as
per TI-RADS recommendations to sample only 2 nodules at a time.
Recommend 1 year ultrasound follow-up.
4. Nodules labeled 3, 5, 7, and 9 meet criteria for annual
ultrasound follow-up.

The above is in keeping with the ACR TI-RADS recommendations - [HOSPITAL] 6693;[DATE].

## 2021-04-29 ENCOUNTER — Other Ambulatory Visit
Admission: RE | Admit: 2021-04-29 | Discharge: 2021-04-29 | Disposition: A | Discharge status: Home / Self Care | Payer: Medicare PPO | Attending: Internal Medicine | Admitting: Internal Medicine

## 2021-04-29 ENCOUNTER — Other Ambulatory Visit: Payer: Self-pay

## 2021-04-29 ENCOUNTER — Encounter: Payer: Self-pay | Admitting: Internal Medicine

## 2021-04-29 ENCOUNTER — Ambulatory Visit: Payer: Medicare PPO | Admitting: Internal Medicine

## 2021-04-29 VITALS — BP 126/74 | HR 74 | Ht 64.0 in | Wt 168.0 lb

## 2021-04-29 DIAGNOSIS — I2510 Atherosclerotic heart disease of native coronary artery without angina pectoris: Secondary | ICD-10-CM

## 2021-04-29 DIAGNOSIS — E785 Hyperlipidemia, unspecified: Secondary | ICD-10-CM | POA: Insufficient documentation

## 2021-04-29 DIAGNOSIS — R002 Palpitations: Secondary | ICD-10-CM | POA: Diagnosis not present

## 2021-04-29 DIAGNOSIS — Z79899 Other long term (current) drug therapy: Secondary | ICD-10-CM | POA: Insufficient documentation

## 2021-04-29 DIAGNOSIS — R42 Dizziness and giddiness: Secondary | ICD-10-CM | POA: Diagnosis not present

## 2021-04-29 DIAGNOSIS — I4729 Other ventricular tachycardia: Secondary | ICD-10-CM

## 2021-04-29 DIAGNOSIS — I25118 Atherosclerotic heart disease of native coronary artery with other forms of angina pectoris: Secondary | ICD-10-CM | POA: Diagnosis not present

## 2021-04-29 LAB — LIPID PANEL
Cholesterol: 128 mg/dL (ref 0–200)
HDL: 56 mg/dL (ref >40)
LDL Cholesterol: 58 mg/dL (ref 0–99)
Total CHOL/HDL Ratio: 2.3 RATIO
Triglycerides: 69 mg/dL (ref <150)
VLDL: 14 mg/dL (ref 0–40)

## 2021-04-29 LAB — COMPREHENSIVE METABOLIC PANEL
ALT: 14 U/L (ref 0–44)
AST: 18 U/L (ref 15–41)
Albumin: 4.1 g/dL (ref 3.5–5.0)
Alkaline Phosphatase: 53 U/L (ref 38–126)
Anion gap: 4 — ABNORMAL LOW (ref 5–15)
BUN: 18 mg/dL (ref 8–23)
CO2: 31 mmol/L (ref 22–32)
Calcium: 9.7 mg/dL (ref 8.9–10.3)
Chloride: 102 mmol/L (ref 98–111)
Creatinine, Ser: 0.64 mg/dL (ref 0.44–1.00)
GFR, Estimated: >60 mL/min (ref >60)
Glucose, Bld: 96 mg/dL (ref 70–99)
Potassium: 4.3 mmol/L (ref 3.5–5.1)
Sodium: 137 mmol/L (ref 135–145)
Total Bilirubin: 0.6 mg/dL (ref 0.3–1.2)
Total Protein: 7.3 g/dL (ref 6.5–8.1)

## 2021-04-29 LAB — TSH: TSH: 0.569 u[IU]/mL (ref 0.350–4.500)

## 2021-04-29 LAB — CBC
HCT: 42.8 % (ref 36.0–46.0)
Hemoglobin: 14 g/dL (ref 12.0–15.0)
MCH: 29.7 pg (ref 26.0–34.0)
MCHC: 32.7 g/dL (ref 30.0–36.0)
MCV: 90.9 fL (ref 80.0–100.0)
Platelets: 187 10*3/uL (ref 150–400)
RBC: 4.71 MIL/uL (ref 3.87–5.11)
RDW: 12.9 % (ref 11.5–15.5)
WBC: 5.1 10*3/uL (ref 4.0–10.5)
nRBC: 0 % (ref 0.0–0.2)

## 2021-04-29 NOTE — Progress Notes (Signed)
Follow-up Outpatient Visit Date: 04/29/2021  Primary Care Provider: Jinny Sanders, MD [place] [address]  Chief Complaint: Follow-up coronary artery disease and palpitations  HPI:  Ms. Medine is a 66 y.o. female with history of coronary artery disease with 60-70% proximal-mid LCx stenosis significant by FFR (0.76 in 02/2019) being managed medically, NSVT, recurrent breast cancer s/p lumpectomy and chemoradiation, and depression/anxiety, who presents for follow-up of coronary artery disease.  I last saw her in July, at which time she reported more stress related to her daughter's ongoing divorce.  She noted 1 episode of "shooting" left-sided chest pain while at rest that resolved after about 10 minutes.  Exertional dyspnea and mild chest pressure while walking were stable.  We deferred medication changes and additional testing at that time.  Today, Ms. Harwick reports that she has been feeling fairly well.  She notes episodic transient dizziness, about once a week.  She feels like things are moving about her.  She fell once 1 chasing after her dog first thing in the morning.  She did not pass out.  She thinks that panic associated with her dog running off may have contributed to the episode.  She notes some occasional indigestion that resolves with belching.  She otherwise has not had any significant chest discomfort, including with exertion.  She notes some exertional dyspnea when doing strenuous chores around the house, which has been stable for more than a year.  She has not had any edema or orthopnea.  --------------------------------------------------------------------------------------------------  Past Medical History:  Diagnosis Date   Anxiety state, unspecified    Basal cell carcinoma (BCC) of face    Breast cancer, right breast ([place]) 10/20/2004   S/P lumpectomy (04/2005) and chemo (03/2005)   Coronary artery disease 02/2019   60-70% prox-mid LCx stenosi  (FFR 0.76) - medical management   Depression    Dizziness and giddiness    Dysrhythmia    Family history of breast cancer    Personal history of chemotherapy 03/2005   Personal history of radiation therapy 05/2005   Past Surgical History:  Procedure Laterality Date   ABDOMINAL HYSTERECTOMY     "left my ovaries; no cancer"   BASAL CELL CARCINOMA EXCISION Right    side of face    BREAST BIOPSY Bilateral    "2 on the left; 1 on the right; all benign"   BREAST BIOPSY Right 10/20/2004   malignant   BREAST EXCISIONAL BIOPSY Left    BREAST LUMPECTOMY Right 04/2005   COLONOSCOPY     LEFT HEART CATH AND CORONARY ANGIOGRAPHY N/A 02/26/2019   Procedure: LEFT HEART CATH AND CORONARY ANGIOGRAPHY;  Surgeon: Belva Crome, MD;  Location: [facility];  Service: Cardiovascular;  Laterality: N/A;   MASTECTOMY COMPLETE / SIMPLE Right 10/17/2016   MASTECTOMY W/ SENTINEL NODE BIOPSY Right 10/17/2016   Procedure: RIGHT MASTECTOMY WITH SENTINEL LYMPH NODE BIOPSY;  Surgeon: Fanny Skates, MD;  Location: [facility];  Service: General;  Laterality: Right;   TONSILLECTOMY  Childhood    Current Meds  Medication Sig   aspirin EC 81 MG tablet Take 1 tablet (81 mg total) by mouth daily.   atorvastatin (LIPITOR) 10 MG tablet Take 1 tablet (10 mg total) by mouth daily.   calcium citrate-vitamin D (CITRACAL+D) 315-200 MG-UNIT per tablet Take 1 tablet by mouth 2 (two) times daily.   metoprolol succinate (TOPROL-XL) 25 MG 24 hr tablet TAKE 1/2 TABLET BY MOUTH EVERY DAY  nitroGLYCERIN (NITROSTAT) 0.4 MG SL tablet Place 1 tablet (0.4 mg total) under the tongue every 5 (five) minutes as needed for chest pain.   sertraline (ZOLOFT) 100 MG tablet TAKE 1 TABLET BY MOUTH EVERY DAY   traZODone (DESYREL) 50 MG tablet TAKE 0.5-1 TABLETS BY MOUTH AT BEDTIME AS NEEDED FOR SLEEP.    Allergies: Betadine [povidone iodine] and Sulfonamide derivatives  Social History   Tobacco Use   Smoking status: Never   Smokeless  tobacco: Never  Vaping Use   Vaping Use: Never used  Substance Use Topics   Alcohol use: No   Drug use: No    Family History  Problem Relation Age of Onset   Heart disease Mother    Hypertension Mother    Hyperlipidemia Mother    Goiter Mother    Cancer Father 49       lung, smoker   Breast cancer Maternal Aunt 45   Breast cancer Paternal Aunt 34   Breast cancer Maternal Aunt 8   Breast cancer Cousin 41       paternal first cousin    Review of Systems: A 12-system review of systems was performed and was negative except as noted in the HPI.  --------------------------------------------------------------------------------------------------  Physical Exam: BP 126/74 (BP Location: Left Arm, Patient Position: Sitting, Cuff Size: Normal)    Pulse 74    Ht 5\' 4"  (1.626 m)    Wt 168 lb (76.2 kg)    SpO2 98%    BMI 28.84 kg/m   General:  NAD. Neck: No JVD or HJR. Lungs: Clear to auscultation bilaterally without wheezes or crackles. Heart: Regular rate and rhythm without murmurs, rubs, or gallops. Abdomen: Soft, nontender, nondistended. Extremities: No lower extremity edema.  EKG: Normal sinus rhythm without abnormality.  Lab Results  Component Value Date   WBC 6.1 03/02/2020   HGB 13.5 03/02/2020   HCT 42.1 03/02/2020   MCV 91.5 03/02/2020   PLT 186 03/02/2020    Lab Results  Component Value Date   NA 141 03/02/2020   K 4.1 03/02/2020   CL 105 03/02/2020   CO2 29 03/02/2020   BUN 16 03/02/2020   CREATININE 0.83 03/02/2020   GLUCOSE 86 03/02/2020   ALT 11 03/02/2020    Lab Results  Component Value Date   CHOL 117 01/31/2020   HDL 55.00 01/31/2020   LDLCALC 52 01/31/2020   TRIG 51.0 01/31/2020   CHOLHDL 2 01/31/2020    --------------------------------------------------------------------------------------------------  ASSESSMENT AND PLAN: Coronary artery disease with stable angina: No significant chest pain reported.  Chronic exertional dyspnea is  stable.  Continue antianginal therapy with low-dose metoprolol as well as secondary prevention with aspirin and atorvastatin.  Lipids well controlled on last check in 2021; check CBC, CMP, and lipid panel today.  Dizziness: Description of dizziness does not sound cardiac in nature.  Defer further work-up at this time.  Palpitations: Minimal at this time.  I will check a CMP and TSH today.  Continue low-dose metoprolol.  Hyperlipidemia: Lipids well controlled on last check in 01/2020.  Continue low-dose rosuvastatin.  We will recheck CMP and lipid panel today.  Follow-up: Return to clinic in 1 year.  Nelva Bush, MD 04/29/2021 9:42 AM

## 2021-04-29 NOTE — Patient Instructions (Signed)
Medication Instructions:   Your physician recommends that you continue on your current medications as directed. Please refer to the Current Medication list given to you today.  *If you need a refill on your cardiac medications before your next appointment, please call your pharmacy*   Lab Work:  Today: CBC, CMET, TSH, Lipid panel  -  Please go to the [facility] will check in at the front desk to the right as you walk into the atrium.   If you have labs (blood work) drawn today and your tests are completely normal, you will receive your results only by: [place] (if you have MyChart) OR A paper copy in the mail If you have any lab test that is abnormal or we need to change your treatment, we will call you to review the results.   Testing/Procedures:  None ordered   Follow-Up: At [facility], you and your health needs are our priority.  As part of our continuing mission to provide you with exceptional heart care, we have created designated Provider Care Teams.  These Care Teams include your primary Cardiologist (physician) and Advanced Practice Providers (APPs -  Physician Assistants and Nurse Practitioners) who all work together to provide you with the care you need, when you need it.  We recommend signing up for the patient portal called "MyChart".  Sign up information is provided on this After Visit Summary.  MyChart is used to connect with patients for Virtual Visits (Telemedicine).  Patients are able to view lab/test results, encounter notes, upcoming appointments, etc.  Non-urgent messages can be sent to your provider as well.   To learn more about what you can do with MyChart, go to [email or web address].    Your next appointment:   1 year(s)  The format for your next appointment:   In Person  Provider:   You may see Nelva Bush, MD or one of the following Advanced Practice Providers on your designated Care Team:   Murray Hodgkins,  NP Christell Faith, PA-C Cadence Kathlen Mody, [place]

## 2021-04-30 ENCOUNTER — Encounter: Payer: Self-pay | Admitting: Internal Medicine

## 2021-05-06 ENCOUNTER — Ambulatory Visit
Admission: RE | Admit: 2021-05-06 | Discharge: 2021-05-06 | Disposition: A | Discharge status: Home / Self Care | Payer: Medicare PPO | Source: Ambulatory Visit | Attending: Oncology | Admitting: Oncology

## 2021-05-06 ENCOUNTER — Other Ambulatory Visit: Payer: Self-pay | Admitting: Family Medicine

## 2021-05-06 DIAGNOSIS — Z1231 Encounter for screening mammogram for malignant neoplasm of breast: Secondary | ICD-10-CM

## 2021-05-16 ENCOUNTER — Other Ambulatory Visit: Payer: Self-pay | Admitting: Family Medicine

## 2021-05-17 ENCOUNTER — Encounter: Payer: Self-pay | Admitting: Internal Medicine

## 2021-05-17 ENCOUNTER — Other Ambulatory Visit: Payer: Self-pay

## 2021-05-17 MED ORDER — METOPROLOL SUCCINATE ER 25 MG PO TB24: 12.5000 mg | ORAL_TABLET | Freq: Every day | ORAL | 2 refills | Status: DC

## 2021-05-17 NOTE — Telephone Encounter (Signed)
Called pt to get scheduled for CPE. Lvmtcb to get scheduled

## 2021-05-17 NOTE — Telephone Encounter (Signed)
Please schedule Medicare Wellness with nurse and a 40 minute CPE with fasting labs prior with Dr. Diona Browner.

## 2021-05-31 DIAGNOSIS — D225 Melanocytic nevi of trunk: Secondary | ICD-10-CM | POA: Diagnosis not present

## 2021-05-31 DIAGNOSIS — D2371 Other benign neoplasm of skin of right lower limb, including hip: Secondary | ICD-10-CM | POA: Diagnosis not present

## 2021-05-31 DIAGNOSIS — Z85828 Personal history of other malignant neoplasm of skin: Secondary | ICD-10-CM | POA: Diagnosis not present

## 2021-05-31 DIAGNOSIS — L918 Other hypertrophic disorders of the skin: Secondary | ICD-10-CM | POA: Diagnosis not present

## 2021-05-31 DIAGNOSIS — L814 Other melanin hyperpigmentation: Secondary | ICD-10-CM | POA: Diagnosis not present

## 2021-05-31 DIAGNOSIS — D2262 Melanocytic nevi of left upper limb, including shoulder: Secondary | ICD-10-CM | POA: Diagnosis not present

## 2021-05-31 DIAGNOSIS — D2272 Melanocytic nevi of left lower limb, including hip: Secondary | ICD-10-CM | POA: Diagnosis not present

## 2021-05-31 DIAGNOSIS — L821 Other seborrheic keratosis: Secondary | ICD-10-CM | POA: Diagnosis not present

## 2021-05-31 DIAGNOSIS — D2261 Melanocytic nevi of right upper limb, including shoulder: Secondary | ICD-10-CM | POA: Diagnosis not present

## 2021-07-09 ENCOUNTER — Telehealth: Payer: Self-pay | Admitting: Family Medicine

## 2021-07-09 NOTE — Telephone Encounter (Signed)
N/A unable to leave a message for patient to call back to schedule Medicare Annual Wellness Visit  ? ?No hx of AWV eligible as of 03/17/21 ? ?Please transfer the call to me if the patient calls the office back.  [phone]  ? ? ?

## 2021-07-09 NOTE — Telephone Encounter (Signed)
Patient called back call was transferred as requested.  ?

## 2021-08-16 ENCOUNTER — Other Ambulatory Visit: Payer: Self-pay | Admitting: Family Medicine

## 2021-08-16 NOTE — Telephone Encounter (Signed)
Last office visit 02/11/2021 (virtual) with Gentry Fitz for bacterial sinusitis.  Last refilled 05/17/2021 for #90 with no refills.  Last CPE 03/17/2020.  No future appointments.  Refill? ?

## 2021-08-19 ENCOUNTER — Other Ambulatory Visit: Payer: Self-pay | Admitting: Family Medicine

## 2021-08-19 ENCOUNTER — Telehealth: Payer: Self-pay | Admitting: Family Medicine

## 2021-08-19 DIAGNOSIS — M810 Age-related osteoporosis without current pathological fracture: Secondary | ICD-10-CM

## 2021-08-19 NOTE — Telephone Encounter (Signed)
Pt called and was scheduled on 11/05/2021 for a Cpe. She would like to get a bone density test before then. Last bone density test was on 12/21/2017. Please advise ? ?Callback Number: [phone] ?

## 2021-08-31 ENCOUNTER — Telehealth: Payer: Self-pay | Admitting: Family Medicine

## 2021-08-31 NOTE — Telephone Encounter (Signed)
Left message for patient to call back and schedule Medicare Annual Wellness Visit (AWV) either virtually or phone ? ? ?AWVI  Last WTM 03/17/20 ?; please schedule at anytime with health coach ? ?This should be a 45 minute visit.  ? ?I left my direct # [phone] ?

## 2021-09-02 ENCOUNTER — Telehealth: Payer: Self-pay

## 2021-09-02 ENCOUNTER — Ambulatory Visit: Payer: Medicare PPO

## 2021-09-02 NOTE — Telephone Encounter (Signed)
Unsuccessful attempt to reach patient on preferred number listed in notes for scheduled AWV. Left message on voicemail ok to reschedule. 

## 2021-09-16 ENCOUNTER — Ambulatory Visit: Payer: Medicare PPO

## 2021-09-16 ENCOUNTER — Telehealth: Payer: Self-pay

## 2021-09-16 NOTE — Telephone Encounter (Signed)
This nurse attempted to call patient three times for AWV. Message left that we will call again to reschedule for another time. 

## 2021-09-21 ENCOUNTER — Ambulatory Visit (INDEPENDENT_AMBULATORY_CARE_PROVIDER_SITE_OTHER): Payer: Medicare PPO

## 2021-09-21 ENCOUNTER — Ambulatory Visit: Payer: Medicare PPO

## 2021-09-21 VITALS — Ht 64.0 in | Wt 168.0 lb

## 2021-09-21 DIAGNOSIS — Z0000 Encounter for general adult medical examination without abnormal findings: Secondary | ICD-10-CM | POA: Diagnosis not present

## 2021-09-21 NOTE — Patient Instructions (Addendum)
Lisa Salinas , Thank you for taking time to come for your Medicare Wellness Visit. I appreciate your ongoing commitment to your health goals. Please review the following plan we discussed and let me know if I can assist you in the future.   These are the goals we discussed:  Goals       Patient stated (pt-stated)      I'm trying to stop collecting stuff.        This is a list of the screening recommended for you and due dates:  Health Maintenance  Topic Date Due   Pneumonia Vaccine (2 - PPSV23 if available, else PCV20) 03/17/2021   COVID-19 Vaccine (4 - Booster for Pfizer series) 10/07/2021*   Flu Shot  11/16/2021   Mammogram  05/07/2023   Tetanus Vaccine  08/18/2024   Colon Cancer Screening  02/08/2027   DEXA scan (bone density measurement)  Completed   Hepatitis C Screening: USPSTF Recommendation to screen - Ages 11-79 yo.  Completed   Zoster (Shingles) Vaccine  Completed   HPV Vaccine  Aged Out  *Topic was postponed. The date shown is not the original due date.    Advanced directives: No  Conditions/risks identified: None  Next appointment: Follow up in one year for your annual wellness visit    Preventive Care 65 Years and Older, Female Preventive care refers to lifestyle choices and visits with your health care provider that can promote health and wellness. What does preventive care include? A yearly physical exam. This is also called an annual well check. Dental exams once or twice a year. Routine eye exams. Ask your health care provider how often you should have your eyes checked. Personal lifestyle choices, including: Daily care of your teeth and gums. Regular physical activity. Eating a healthy diet. Avoiding tobacco and drug use. Limiting alcohol use. Practicing safe sex. Taking low-dose aspirin every day. Taking vitamin and mineral supplements as recommended by your health care provider. What happens during an annual well check? The services and  screenings done by your health care provider during your annual well check will depend on your age, overall health, lifestyle risk factors, and family history of disease. Counseling  Your health care provider may ask you questions about your: Alcohol use. Tobacco use. Drug use. Emotional well-being. Home and relationship well-being. Sexual activity. Eating habits. History of falls. Memory and ability to understand (cognition). Work and work Statistician. Reproductive health. Screening  You may have the following tests or measurements: Height, weight, and BMI. Blood pressure. Lipid and cholesterol levels. These may be checked every 5 years, or more frequently if you are over 43 years old. Skin check. Lung cancer screening. You may have this screening every year starting at age 76 if you have a 30-pack-year history of smoking and currently smoke or have quit within the past 15 years. Fecal occult blood test (FOBT) of the stool. You may have this test every year starting at age 20. Flexible sigmoidoscopy or colonoscopy. You may have a sigmoidoscopy every 5 years or a colonoscopy every 10 years starting at age 72. Hepatitis C blood test. Hepatitis B blood test. Sexually transmitted disease (STD) testing. Diabetes screening. This is done by checking your blood sugar (glucose) after you have not eaten for a while (fasting). You may have this done every 1-3 years. Bone density scan. This is done to screen for osteoporosis. You may have this done starting at age 63. Mammogram. This may be done every 1-2 years. Talk to  your health care provider about how often you should have regular mammograms. Talk with your health care provider about your test results, treatment options, and if necessary, the need for more tests. Vaccines  Your health care provider may recommend certain vaccines, such as: Influenza vaccine. This is recommended every year. Tetanus, diphtheria, and acellular pertussis (Tdap,  Td) vaccine. You may need a Td booster every 10 years. Zoster vaccine. You may need this after age 33. Pneumococcal 13-valent conjugate (PCV13) vaccine. One dose is recommended after age 64. Pneumococcal polysaccharide (PPSV23) vaccine. One dose is recommended after age 30. Talk to your health care provider about which screenings and vaccines you need and how often you need them. This information is not intended to replace advice given to you by your health care provider. Make sure you discuss any questions you have with your health care provider. Document Released: 05/01/2015 Document Revised: 12/23/2015 Document Reviewed: 02/03/2015 Elsevier Interactive Patient Education  2017 [place] Prevention in the Home Falls can cause injuries. They can happen to people of all ages. There are many things you can do to make your home safe and to help prevent falls. What can I do on the outside of my home? Regularly fix the edges of walkways and driveways and fix any cracks. Remove anything that might make you trip as you walk through a door, such as a raised step or threshold. Trim any bushes or trees on the path to your home. Use bright outdoor lighting. Clear any walking paths of anything that might make someone trip, such as rocks or tools. Regularly check to see if handrails are loose or broken. Make sure that both sides of any steps have handrails. Any raised decks and porches should have guardrails on the edges. Have any leaves, snow, or ice cleared regularly. Use sand or salt on walking paths during winter. Clean up any spills in your garage right away. This includes oil or grease spills. What can I do in the bathroom? Use night lights. Install grab bars by the toilet and in the tub and shower. Do not use towel bars as grab bars. Use non-skid mats or decals in the tub or shower. If you need to sit down in the shower, use a plastic, non-slip stool. Keep the floor dry. Clean up any  water that spills on the floor as soon as it happens. Remove soap buildup in the tub or shower regularly. Attach bath mats securely with double-sided non-slip rug tape. Do not have throw rugs and other things on the floor that can make you trip. What can I do in the bedroom? Use night lights. Make sure that you have a light by your bed that is easy to reach. Do not use any sheets or blankets that are too big for your bed. They should not hang down onto the floor. Have a firm chair that has side arms. You can use this for support while you get dressed. Do not have throw rugs and other things on the floor that can make you trip. What can I do in the kitchen? Clean up any spills right away. Avoid walking on wet floors. Keep items that you use a lot in easy-to-reach places. If you need to reach something above you, use a strong step stool that has a grab bar. Keep electrical cords out of the way. Do not use floor polish or wax that makes floors slippery. If you must use wax, use non-skid floor wax. Do not  have throw rugs and other things on the floor that can make you trip. What can I do with my stairs? Do not leave any items on the stairs. Make sure that there are handrails on both sides of the stairs and use them. Fix handrails that are broken or loose. Make sure that handrails are as long as the stairways. Check any carpeting to make sure that it is firmly attached to the stairs. Fix any carpet that is loose or worn. Avoid having throw rugs at the top or bottom of the stairs. If you do have throw rugs, attach them to the floor with carpet tape. Make sure that you have a light switch at the top of the stairs and the bottom of the stairs. If you do not have them, ask someone to add them for you. What else can I do to help prevent falls? Wear shoes that: Do not have high heels. Have rubber bottoms. Are comfortable and fit you well. Are closed at the toe. Do not wear sandals. If you use a  stepladder: Make sure that it is fully opened. Do not climb a closed stepladder. Make sure that both sides of the stepladder are locked into place. Ask someone to hold it for you, if possible. Clearly mark and make sure that you can see: Any grab bars or handrails. First and last steps. Where the edge of each step is. Use tools that help you move around (mobility aids) if they are needed. These include: Canes. Walkers. Scooters. Crutches. Turn on the lights when you go into a dark area. Replace any light bulbs as soon as they burn out. Set up your furniture so you have a clear path. Avoid moving your furniture around. If any of your floors are uneven, fix them. If there are any pets around you, be aware of where they are. Review your medicines with your doctor. Some medicines can make you feel dizzy. This can increase your chance of falling. Ask your doctor what other things that you can do to help prevent falls. This information is not intended to replace advice given to you by your health care provider. Make sure you discuss any questions you have with your health care provider. Document Released: 01/29/2009 Document Revised: 09/10/2015 Document Reviewed: 05/09/2014 Elsevier Interactive Patient Education  2017 [employer].

## 2021-09-21 NOTE — Progress Notes (Signed)
Subjective:   Lisa Salinas is a 66 y.o. female who presents for Medicare Annual (Subsequent) preventive examination.  Review of Systems    Virtual Visit via Telephone Note  I connected with  Lisa Salinas on 09/21/21 at  9:15 AM EDT by telephone and verified that I am speaking with the correct person using two identifiers.  Location: Patient: Home Provider: Office Persons participating in the virtual visit: patient/Nurse Health Advisor   I discussed the limitations, risks, security and privacy concerns of performing an evaluation and management service by telephone and the availability of in person appointments. The patient expressed understanding and agreed to proceed.  Interactive audio and video telecommunications were attempted between this nurse and patient, however failed, due to patient having technical difficulties OR patient did not have access to video capability.  We continued and completed visit with audio only.  Some vital signs may be absent or patient reported.   Criselda Peaches, LPN  Cardiac Risk Factors include: advanced age (>57mn, >>63women)     Objective:    Today's Vitals   09/21/21 0928  Weight: 168 lb (76.2 kg)  Height: '5\' 4"'$  (1.626 m)   Body mass index is 28.84 kg/m.     09/21/2021    9:45 AM 09/21/2021    9:44 AM 02/26/2019    5:56 AM 11/24/2016    2:44 PM 10/17/2016    6:03 PM 10/14/2016    1:03 PM  Advanced Directives  Does Patient Have a Medical Advance Directive? No No No No No No  Would patient like information on creating a medical advance directive? No - Patient declined No - Patient declined No - Patient declined Yes (MAU/Ambulatory/Procedural Areas - Information given) No - Patient declined No - Patient declined    Current Medications (verified) Outpatient Encounter Medications as of 09/21/2021  Medication Sig   aspirin EC 81 MG tablet Take 1 tablet (81 mg total) by mouth daily.   atorvastatin (LIPITOR) 10 MG tablet Take 1 tablet  (10 mg total) by mouth daily.   calcium citrate-vitamin D (CITRACAL+D) 315-200 MG-UNIT per tablet Take 1 tablet by mouth 2 (two) times daily.   metoprolol succinate (TOPROL-XL) 25 MG 24 hr tablet Take 0.5 tablets (12.5 mg total) by mouth daily.   nitroGLYCERIN (NITROSTAT) 0.4 MG SL tablet Place 1 tablet (0.4 mg total) under the tongue every 5 (five) minutes as needed for chest pain.   sertraline (ZOLOFT) 100 MG tablet TAKE 1 TABLET BY MOUTH EVERY DAY   traZODone (DESYREL) 50 MG tablet TAKE 0.5-1 TABLETS BY MOUTH AT BEDTIME AS NEEDED FOR SLEEP.   No facility-administered encounter medications on file as of 09/21/2021.    Allergies (verified) Betadine [povidone iodine] and Sulfonamide derivatives   History: Past Medical History:  Diagnosis Date   Anxiety state, unspecified    Basal cell carcinoma (BCC) of face    Breast cancer, right breast ([facility] 10/20/2004   S/P lumpectomy (04/2005) and chemo (03/2005)   Coronary artery disease 02/2019   60-70% prox-mid LCx stenosi (FFR 0.76) - medical management   Depression    Dizziness and giddiness    Dysrhythmia    Family history of breast cancer    Personal history of chemotherapy 03/2005   Personal history of radiation therapy 05/2005   Past Surgical History:  Procedure Laterality Date   ABDOMINAL HYSTERECTOMY     "left my ovaries; no cancer"   BASAL CELL CARCINOMA EXCISION Right    side of face  BREAST BIOPSY Bilateral    "2 on the left; 1 on the right; all benign"   BREAST BIOPSY Right 10/20/2004   malignant   BREAST EXCISIONAL BIOPSY Left    BREAST LUMPECTOMY Right 04/2005   COLONOSCOPY     LEFT HEART CATH AND CORONARY ANGIOGRAPHY N/A 02/26/2019   Procedure: LEFT HEART CATH AND CORONARY ANGIOGRAPHY;  Surgeon: Belva Crome, MD;  Location: [facility];  Service: Cardiovascular;  Laterality: N/A;   MASTECTOMY COMPLETE / SIMPLE Right 10/17/2016   MASTECTOMY W/ SENTINEL NODE BIOPSY Right 10/17/2016   Procedure: RIGHT MASTECTOMY  WITH SENTINEL LYMPH NODE BIOPSY;  Surgeon: Fanny Skates, MD;  Location: [facility];  Service: General;  Laterality: Right;   TONSILLECTOMY  Childhood   Family History  Problem Relation Age of Onset   Heart disease Mother    Hypertension Mother    Hyperlipidemia Mother    Goiter Mother    Cancer Father 22       lung, smoker   Breast cancer Maternal Aunt 45   Breast cancer Paternal Aunt 45   Breast cancer Maternal Aunt 7   Breast cancer Cousin 27       paternal first cousin   Social History   Socioeconomic History   Marital status: Married    Spouse name: Not on file   Number of children: Not on file   Years of education: Not on file   Highest education level: Not on file  Occupational History   Not on file  Tobacco Use   Smoking status: Never   Smokeless tobacco: Never  Vaping Use   Vaping Use: Never used  Substance and Sexual Activity   Alcohol use: No   Drug use: No   Sexual activity: Not on file  Other Topics Concern   Not on file  Social History Narrative   Lisa Salinas    2 kids age 43 and 53   Social Determinants of Health   Financial Resource Strain: Low Risk    Difficulty of Paying Living Expenses: Not hard at all  Food Insecurity: No Food Insecurity   Worried About Charity fundraiser in the Last Year: Never true   Arboriculturist in the Last Year: Never true  Transportation Needs: No Transportation Needs   Lack of Transportation (Medical): No   Lack of Transportation (Non-Medical): No  Physical Activity: Insufficiently Active   Days of Exercise per Week: 3 days   Minutes of Exercise per Session: 30 min  Stress: No Stress Concern Present   Feeling of Stress : Only a little  Social Connections: Engineer, building services of Communication with Friends and Family: More than three times a week   Frequency of Social Gatherings with Friends and Family: More than three times a week   Attends Religious Services: More than 4 times per year    Active Member of [employer] or Organizations: Yes   Attends Music therapist: More than 4 times per year   Marital Status: Married    Tobacco Counseling Counseling given: Not Answered   Clinical Intake:  Diabetic?  No  Activities of Daily Living    09/21/2021    9:41 AM 02/11/2021   11:21 AM  In your present state of health, do you have any difficulty performing the following activities:  Hearing? 0 0  Vision? 0 0  Difficulty concentrating or making decisions? 0 0  Walking or climbing stairs? 0 0  Dressing or  bathing? 0 0  Doing errands, shopping? 0 0  Preparing Food and eating ? N   Using the Toilet? N   In the past six months, have you accidently leaked urine? N   Do you have problems with loss of bowel control? N   Managing your Medications? N   Managing your Finances? N   Housekeeping or managing your Housekeeping? N     Patient Care Team: Jinny Sanders, MD as PCP - General End, Harrell Gave, MD as PCP - Cardiology (Cardiology) Magrinat, Virgie Dad, MD (Inactive) as Consulting Physician (Oncology) Irene Limbo, MD as Consulting Physician (Plastic Surgery) Fanny Skates, MD as Consulting Physician (General Surgery)  Indicate any recent Medical Services you may have received from other than [facility] providers in the past year (date may be approximate).     Assessment:   This is a routine wellness examination for Nabiha.  Hearing/Vision screen Hearing Screening - Comments:: No hearing difficulty Vision Screening - Comments:: Wears glasses. Followed by [facility]  Dietary issues and exercise activities discussed: Exercise limited by: None identified   Goals Addressed               This Visit's Progress     Patient stated (pt-stated)        I'm trying to stop collecting stuff.       Depression Screen    09/21/2021    9:33 AM 02/11/2021   11:18 AM 02/01/2019    3:10 PM 10/31/2017    8:36 AM 03/21/2017   11:11 AM 02/02/2017    4:20 PM   PHQ 2/9 Scores  PHQ - 2 Score 2 0 0 0 1 3  PHQ- 9 Score '2 2 3  11 15    '$ Fall Risk    09/21/2021    9:41 AM 03/17/2020    3:20 PM  Fall Risk   Falls in the past year? 1 0  Number falls in past yr: 0 0  Injury with Fall? 0 0  Comment No injury or medical attention needed   Risk for fall due to : No Fall Risks     FALL RISK PREVENTION PERTAINING TO THE HOME:  Any stairs in or around the home? Yes  If so, are there any without handrails? No  Home free of loose throw rugs in walkways, pet beds, electrical cords, etc? Yes Adequate lighting in your home to reduce risk of falls? Yes   ASSISTIVE DEVICES UTILIZED TO PREVENT FALLS:  Life alert? No  Use of a cane, walker or w/c? No  Grab bars in the bathroom? Yes Shower chair or bench in shower? No  Elevated toilet seat or a handicapped toilet? Yes   TIMED UP AND GO:  Was the test performed? No . Audio Visit    Cognitive Function:      Immunizations Immunization History  Administered Date(s) Administered   Fluad Quad(high Dose 65+) 03/17/2020   Influenza, High Dose Seasonal PF 03/29/2021   Influenza,inj,Quad PF,6+ Mos 02/02/2017, 04/02/2018, 02/28/2019   Influenza-Unspecified 01/17/2015   PFIZER(Purple Top)SARS-COV-2 Vaccination 06/27/2019, 07/23/2019, 04/24/2020   Pneumococcal Conjugate-13 03/17/2020   Tdap 08/19/2014   Zoster Recombinat (Shingrix) 02/01/2019, 04/04/2019    TDAP status: Up to date  Flu Vaccine status: Up to date  Pneumococcal vaccine status: Completed during today's visit.  Covid-19 vaccine status: Completed vaccines  Qualifies for Shingles Vaccine? Yes   Zostavax completed Yes   Shingrix Completed?: Yes  Screening Tests Health Maintenance  Topic Date Due  Pneumonia Vaccine 63+ Years old (2 - PPSV23 if available, else PCV20) 03/17/2021   COVID-19 Vaccine (4 - Booster for Pfizer series) 10/07/2021 (Originally 06/19/2020)   INFLUENZA VACCINE  11/16/2021   MAMMOGRAM  05/07/2023    TETANUS/TDAP  08/18/2024   COLONOSCOPY (Pts 45-78yr Insurance coverage will need to be confirmed)  02/08/2027   DEXA SCAN  Completed   Hepatitis C Screening  Completed   Zoster Vaccines- Shingrix  Completed   HPV VACCINES  Aged Out    Health Maintenance  Health Maintenance Due  Topic Date Due   Pneumonia Vaccine 67 Years old (2 - PPSV23 if available, else PCV20) 03/17/2021    Colorectal cancer screening: Type of screening: Colonoscopy. Completed 02/07/17. Repeat every 10 years  Mammogram status: Completed 05/06/21. Repeat every year  Bone Density status: Completed 12/21/17. Results reflect: Bone density results: OSTEOPOROSIS. Repeat every 2 years.  Lung Cancer Screening: (Low Dose CT Chest recommended if Age 67-80years, 30 pack-year currently smoking OR have quit w/in 15years.) does not qualify.     Additional Screening:  Hepatitis C Screening: does qualify; Completed 08/19/14  Vision Screening: Recommended annual ophthalmology exams for early detection of glaucoma and other disorders of the eye. Is the patient up to date with their annual eye exam?  Yes  Who is the provider or what is the name of the office in which the patient attends annual eye exams? [facility] If pt is not established with a provider, would they like to be referred to a provider to establish care? No .   Dental Screening: Recommended annual dental exams for proper oral hygiene  Community Resource Referral / Chronic Care Management:   CRR required this visit?  No   CCM required this visit?  No      Plan:     I have personally reviewed and noted the following in the patient's chart:   Medical and social history Use of alcohol, tobacco or illicit drugs  Current medications and supplements including opioid prescriptions.  Functional ability and status Nutritional status Physical activity Advanced directives List of other physicians Hospitalizations, surgeries, and ER visits in previous 12  months Vitals Screenings to include cognitive, depression, and falls Referrals and appointments  In addition, I have reviewed and discussed with patient certain preventive protocols, quality metrics, and best practice recommendations. A written personalized care plan for preventive services as well as general preventive health recommendations were provided to patient.     BCriselda Peaches LPN   62/0/1007  Nurse Notes: None

## 2021-11-05 ENCOUNTER — Encounter: Payer: Medicare PPO | Admitting: Family Medicine

## 2021-11-15 ENCOUNTER — Ambulatory Visit
Admission: RE | Admit: 2021-11-15 | Discharge: 2021-11-15 | Disposition: A | Discharge status: Home / Self Care | Payer: Medicare PPO | Source: Ambulatory Visit | Attending: Family Medicine | Admitting: Family Medicine

## 2021-11-15 DIAGNOSIS — Z780 Asymptomatic menopausal state: Secondary | ICD-10-CM | POA: Diagnosis not present

## 2021-11-15 DIAGNOSIS — M810 Age-related osteoporosis without current pathological fracture: Secondary | ICD-10-CM | POA: Diagnosis not present

## 2021-11-23 ENCOUNTER — Encounter: Payer: Self-pay | Admitting: Family Medicine

## 2021-11-23 ENCOUNTER — Ambulatory Visit (INDEPENDENT_AMBULATORY_CARE_PROVIDER_SITE_OTHER): Payer: Medicare PPO | Admitting: Family Medicine

## 2021-11-23 VITALS — BP 100/70 | HR 76 | Temp 98.3°F | Ht 65.0 in | Wt 171.2 lb

## 2021-11-23 DIAGNOSIS — N6325 Unspecified lump in the left breast, overlapping quadrants: Secondary | ICD-10-CM

## 2021-11-23 DIAGNOSIS — Z23 Encounter for immunization: Secondary | ICD-10-CM

## 2021-11-23 DIAGNOSIS — Z853 Personal history of malignant neoplasm of breast: Secondary | ICD-10-CM | POA: Diagnosis not present

## 2021-11-23 DIAGNOSIS — R5383 Other fatigue: Secondary | ICD-10-CM | POA: Diagnosis not present

## 2021-11-23 DIAGNOSIS — E785 Hyperlipidemia, unspecified: Secondary | ICD-10-CM | POA: Diagnosis not present

## 2021-11-23 DIAGNOSIS — F331 Major depressive disorder, recurrent, moderate: Secondary | ICD-10-CM | POA: Diagnosis not present

## 2021-11-23 DIAGNOSIS — Z0000 Encounter for general adult medical examination without abnormal findings: Secondary | ICD-10-CM

## 2021-11-23 LAB — CBC WITH DIFFERENTIAL/PLATELET
Basophils Absolute: 0 10*3/uL (ref 0.0–0.1)
Basophils Relative: 0.4 % (ref 0.0–3.0)
Eosinophils Absolute: 0.1 10*3/uL (ref 0.0–0.7)
Eosinophils Relative: 2.9 % (ref 0.0–5.0)
HCT: 41.1 % (ref 36.0–46.0)
Hemoglobin: 13.5 g/dL (ref 12.0–15.0)
Lymphocytes Relative: 39.4 % (ref 12.0–46.0)
Lymphs Abs: 2.1 10*3/uL (ref 0.7–4.0)
MCHC: 33 g/dL (ref 30.0–36.0)
MCV: 89.2 fl (ref 78.0–100.0)
Monocytes Absolute: 0.4 10*3/uL (ref 0.1–1.0)
Monocytes Relative: 8.4 % (ref 3.0–12.0)
Neutro Abs: 2.6 10*3/uL (ref 1.4–7.7)
Neutrophils Relative %: 48.9 % (ref 43.0–77.0)
Platelets: 183 10*3/uL (ref 150.0–400.0)
RBC: 4.61 Mil/uL (ref 3.87–5.11)
RDW: 13.6 % (ref 11.5–15.5)
WBC: 5.2 10*3/uL (ref 4.0–10.5)

## 2021-11-23 LAB — VITAMIN D 25 HYDROXY (VIT D DEFICIENCY, FRACTURES): VITD: 30.22 ng/mL (ref 30.00–100.00)

## 2021-11-23 LAB — TSH: TSH: 0.76 u[IU]/mL (ref 0.35–5.50)

## 2021-11-23 LAB — T3, FREE: T3, Free: 2.8 pg/mL (ref 2.3–4.2)

## 2021-11-23 LAB — T4, FREE: Free T4: 0.82 ng/dL (ref 0.60–1.60)

## 2021-11-23 LAB — VITAMIN B12: Vitamin B-12: 238 pg/mL (ref 211–911)

## 2021-11-23 MED ORDER — IBANDRONATE SODIUM 150 MG PO TABS: 150.0000 mg | ORAL_TABLET | ORAL | 3 refills | Status: DC

## 2021-11-23 NOTE — Patient Instructions (Addendum)
Please stop at the lab to have labs drawn.  Can try tylenol for low back pain, hip pain. Start regular exercise 3-5 day a week.  Start Boniva monthly and we will recheck bone density in 2 years

## 2021-11-23 NOTE — Assessment & Plan Note (Signed)
History of recurrent breast cancer followed by oncology.  She has completed course of tamoxifen.

## 2021-11-23 NOTE — Assessment & Plan Note (Signed)
Well-controlled on sertraline 100 mg daily and using trazodone 25 to 50 mg at bedtime for insomnia

## 2021-11-23 NOTE — Progress Notes (Signed)
Patient ID: Lisa Salinas, female    DOB: 1955-05-27, 66 y.o.   MRN: [id number]  This visit was conducted in person.  BP 100/70   Pulse 76   Temp 98.3 F (36.8 C) (Oral)   Ht 5\' 5"  (1.651 m)   Wt 171 lb 4 oz (77.7 kg)   SpO2 95%   BMI 28.50 kg/m    CC:  Chief Complaint  Patient presents with   Annual Exam    Part 2    Subjective:   HPI: Lisa Salinas is a 66 y.o. female presenting on 11/23/2021 for Annual Exam (Part 2)  The patient presents for  complete physical and review of chronic health problems. He/She also has the following acute concerns today: deceased energy in last several months She feels it may be due to adjustment from retiring.     The patient saw a LPN or RN for medicare wellness visit.  Prevention and wellness was reviewed in detail. Note reviewed and important notes copied below.  MDD, recurrent : Well-controlled on sertraline 100 mg daily and using trazodone 25 to 50 mg at bedtime for insomnia Flowsheet Row Clinical Support from 09/21/2021 in [facility] Total Score 2      History of breast cancer, recurrent: Has completed tamoxifen course.  Elevated Cholesterol: LDL at goal on atorvastatin 10 mg p.o. daily.  LDL goal less than 70 given coronary artery disease. Lab Results  Component Value Date   CHOL 128 04/29/2021   HDL 56 04/29/2021   LDLCALC 58 04/29/2021   TRIG 69 04/29/2021   CHOLHDL 2.3 04/29/2021  Using medications without problems: Muscle aches:  Diet compliance: moderate Exercise: None Other complaints: Wt Readings from Last 3 Encounters:  11/23/21 171 lb 4 oz (77.7 kg)  09/21/21 168 lb (76.2 kg)  04/29/21 168 lb (76.2 kg)     Relevant past medical, surgical, family and social history reviewed and updated as indicated. Interim medical history since our last visit reviewed. Allergies and medications reviewed and updated. Outpatient Medications Prior to Visit  Medication Sig Dispense Refill    aspirin EC 81 MG tablet Take 1 tablet (81 mg total) by mouth daily. 90 tablet 3   atorvastatin (LIPITOR) 10 MG tablet Take 1 tablet (10 mg total) by mouth daily. 90 tablet 3   calcium citrate-vitamin D (CITRACAL+D) 315-200 MG-UNIT per tablet Take 1 tablet by mouth 2 (two) times daily.     metoprolol succinate (TOPROL-XL) 25 MG 24 hr tablet Take 0.5 tablets (12.5 mg total) by mouth daily. 45 tablet 2   nitroGLYCERIN (NITROSTAT) 0.4 MG SL tablet Place 1 tablet (0.4 mg total) under the tongue every 5 (five) minutes as needed for chest pain. 25 tablet 3   sertraline (ZOLOFT) 100 MG tablet TAKE 1 TABLET BY MOUTH EVERY DAY 90 tablet 0   traZODone (DESYREL) 50 MG tablet TAKE 0.5-1 TABLETS BY MOUTH AT BEDTIME AS NEEDED FOR SLEEP. 90 tablet 0   No facility-administered medications prior to visit.     Per HPI unless specifically indicated in ROS section below Review of Systems  Constitutional:  Negative for fatigue and fever.  HENT:  Negative for congestion.   Eyes:  Negative for pain.  Respiratory:  Negative for cough and shortness of breath.   Cardiovascular:  Negative for chest pain, palpitations and leg swelling.  Gastrointestinal:  Negative for abdominal pain.  Genitourinary:  Negative for dysuria and vaginal bleeding.  Musculoskeletal:  Negative  for back pain.  Neurological:  Negative for syncope, light-headedness and headaches.  Psychiatric/Behavioral:  Negative for dysphoric mood.    Objective:  BP 100/70   Pulse 76   Temp 98.3 F (36.8 C) (Oral)   Ht $R'5\' 5"'fq$  (1.651 m)   Wt 171 lb 4 oz (77.7 kg)   SpO2 95%   BMI 28.50 kg/m   Wt Readings from Last 3 Encounters:  11/23/21 171 lb 4 oz (77.7 kg)  09/21/21 168 lb (76.2 kg)  04/29/21 168 lb (76.2 kg)      Physical Exam Vitals and nursing note reviewed.  Constitutional:      General: She is not in acute distress.    Appearance: Normal appearance. She is well-developed. She is not ill-appearing or toxic-appearing.  HENT:      Head: Normocephalic.     Right Ear: Hearing, tympanic membrane, ear canal and external ear normal.     Left Ear: Hearing, tympanic membrane, ear canal and external ear normal.     Nose: Nose normal.  Eyes:     General: Lids are normal. Lids are everted, no foreign bodies appreciated.     Conjunctiva/sclera: Conjunctivae normal.     Pupils: Pupils are equal, round, and reactive to light.  Neck:     Thyroid: No thyroid mass or thyromegaly.     Vascular: No carotid bruit.     Trachea: Trachea normal.  Cardiovascular:     Rate and Rhythm: Normal rate and regular rhythm.     Heart sounds: Normal heart sounds, S1 normal and S2 normal. No murmur heard.    No gallop.  Pulmonary:     Effort: Pulmonary effort is normal. No respiratory distress.     Breath sounds: Normal breath sounds. No wheezing, rhonchi or rales.  Abdominal:     General: Bowel sounds are normal. There is no distension or abdominal bruit.     Palpations: Abdomen is soft. There is no fluid wave or mass.     Tenderness: There is no abdominal tenderness. There is no guarding or rebound.     Hernia: No hernia is present.  Musculoskeletal:     Cervical back: Normal range of motion and neck supple.  Lymphadenopathy:     Cervical: No cervical adenopathy.  Skin:    General: Skin is warm and dry.     Findings: No rash.  Neurological:     Mental Status: She is alert.     Cranial Nerves: No cranial nerve deficit.     Sensory: No sensory deficit.  Psychiatric:        Mood and Affect: Mood is not anxious or depressed.        Speech: Speech normal.        Behavior: Behavior normal. Behavior is cooperative.        Judgment: Judgment normal.      Results for orders placed or performed during the hospital encounter of 04/29/21  Lipid Profile  Result Value Ref Range   Cholesterol 128 0 - 200 mg/dL   Triglycerides 69 <150 mg/dL   HDL 56 >40 mg/dL   Total CHOL/HDL Ratio 2.3 RATIO   VLDL 14 0 - 40 mg/dL   LDL Cholesterol 58 0 -  99 mg/dL  TSH  Result Value Ref Range   TSH 0.569 0.350 - 4.500 uIU/mL  Comp Met (CMET)  Result Value Ref Range   Sodium 137 135 - 145 mmol/L   Potassium 4.3 3.5 - 5.1 mmol/L   Chloride  102 98 - 111 mmol/L   CO2 31 22 - 32 mmol/L   Glucose, Bld 96 70 - 99 mg/dL   BUN 18 8 - 23 mg/dL   Creatinine, Ser 0.64 0.44 - 1.00 mg/dL   Calcium 9.7 8.9 - 10.3 mg/dL   Total Protein 7.3 6.5 - 8.1 g/dL   Albumin 4.1 3.5 - 5.0 g/dL   AST 18 15 - 41 U/L   ALT 14 0 - 44 U/L   Alkaline Phosphatase 53 38 - 126 U/L   Total Bilirubin 0.6 0.3 - 1.2 mg/dL   GFR, Estimated >60 >60 mL/min   Anion gap 4 (L) 5 - 15  CBC  Result Value Ref Range   WBC 5.1 4.0 - 10.5 K/uL   RBC 4.71 3.87 - 5.11 MIL/uL   Hemoglobin 14.0 12.0 - 15.0 g/dL   HCT 42.8 36.0 - 46.0 %   MCV 90.9 80.0 - 100.0 fL   MCH 29.7 26.0 - 34.0 pg   MCHC 32.7 30.0 - 36.0 g/dL   RDW 12.9 11.5 - 15.5 %   Platelets 187 150 - 400 K/uL   nRBC 0.0 0.0 - 0.2 %     COVID 19 screen:  No recent travel or known exposure to COVID19 The patient denies respiratory symptoms of COVID 19 at this time. The importance of social distancing was discussed today.   Assessment and Plan   The patient's preventative maintenance and recommended screening tests for an annual wellness exam were reviewed in full today. Brought up to date unless services declined.  Counselled on the importance of diet, exercise, and its role in overall health and mortality. The patient's FH and SH was reviewed, including their home life, tobacco status, and drug and alcohol status.   Had total hysterectomy .Marland Kitchenno further paps needed. Mammogram: stable 04/2021.. breast cancer history rec yearly Colon:  01/2017 nml , repeat in 10 years. Vaccines: uptodate,  Has  Had 3 COVID vaccines. She is S/P shingrix series. HIV: refused DEXA: 10/2021 improved  osteoporosis. Was On tamoxifen for 2  year course.  Now off.  Was previously treated with Boniva x 2 years prior to 2019, no SE....  new  plan: restart BONVIA and recheck in 2 years. Problem List Items Addressed This Visit     Major depression, recurrent ([place]) (Chronic)    Well-controlled on sertraline 100 mg daily and using trazodone 25 to 50 mg at bedtime for insomnia      History of breast cancer    History of recurrent breast cancer followed by oncology.  She has completed course of tamoxifen.      Relevant Orders   US BREAST LTD UNI LEFT INC AXILLA   MM DIAG BREAST TOMO UNI LEFT   Hyperlipidemia LDL goal <70    LDL at goal on atorvastatin 10 mg p.o. daily.  LDL goal less than 70 given coronary artery disease.      Other Visit Diagnoses     Routine general medical examination at a health care facility    -  Primary   Other fatigue       Relevant Orders   CBC with Differential/Platelet (Completed)   VITAMIN D 25 Hydroxy (Vit-D Deficiency, Fractures) (Completed)   TSH (Completed)   T4, free (Completed)   T3, free (Completed)   Vitamin B12 (Completed)   Need for 23-polyvalent pneumococcal polysaccharide vaccine       Relevant Orders   Pneumococcal polysaccharide vaccine 23-valent greater than or equal to  2yo subcutaneous/IM (Completed)   Mass overlapping multiple quadrants of left breast       Relevant Orders   US BREAST LTD UNI LEFT INC AXILLA   MM DIAG BREAST TOMO UNI LEFT      Meds ordered this encounter  Medications   ibandronate (BONIVA) 150 MG tablet    Sig: Take 1 tablet (150 mg total) by mouth every 30 (thirty) days. Take in the morning with a full glass of water, on an empty stomach, and do not take anything else by mouth or lie down for the next 30 min.    Dispense:  3 tablet    Refill:  3   Orders Placed This Encounter  Procedures   US BREAST LTD UNI LEFT INC AXILLA    INS:HUM MCR  PREV:05/06/2021 $RemoveBeforeDE'@BCG'[id number]$   YES SURGERY HX OF RIGHT MASTECTOMY / NO IMPLANTS OR REDUCTION/YES BREAST CANCER// NO NEEDS AJ SW PT  PT AWARE $75 NO SHOW/CANCELLATION FEE WITHIN 24 HOURS  Epic ORDER     Standing  Status:   Future    Standing Expiration Date:   11/24/2022    Order Specific Question:   Reason for Exam (SYMPTOM  OR DIAGNOSIS REQUIRED)    Answer:   left breast mass, 9 oclock    Order Specific Question:   Preferred imaging location?    Answer:   [facility]   MM DIAG BREAST TOMO UNI LEFT    INS:[facility] MCR  PREV:05/06/2021 $RemoveBeforeDE'@BCG'[id number]$   YES SURGERY HX OF RIGHT MASTECTOMY / NO IMPLANTS OR REDUCTION/YES BREAST CANCER// NO NEEDS AJ SW PT  PT AWARE $75 NO SHOW/CANCELLATION FEE WITHIN 24 HOURS  Epic ORDER     Standing Status:   Future    Standing Expiration Date:   11/24/2022    Order Specific Question:   Reason for Exam (SYMPTOM  OR DIAGNOSIS REQUIRED)    Answer:   left breast mass, 9 ocklock... has increased in size    Order Specific Question:   Preferred imaging location?    Answer:   [facility]   Pneumococcal polysaccharide vaccine 23-valent greater than or equal to 2yo subcutaneous/IM   CBC with Differential/Platelet   VITAMIN D 25 Hydroxy (Vit-D Deficiency, Fractures)   TSH   T4, free   T3, free   Vitamin B12    Eliezer Lofts, MD

## 2021-11-23 NOTE — Assessment & Plan Note (Signed)
LDL at goal on atorvastatin 10 mg p.o. daily.  LDL goal less than 70 given coronary artery disease.

## 2021-11-23 NOTE — Assessment & Plan Note (Signed)
Recurrent breast cancer actively treated by oncology with tamoxifen.

## 2021-12-28 ENCOUNTER — Ambulatory Visit
Admission: RE | Admit: 2021-12-28 | Discharge: 2021-12-28 | Disposition: A | Discharge status: Home / Self Care | Payer: Medicare PPO | Source: Ambulatory Visit | Attending: Family Medicine | Admitting: Family Medicine

## 2021-12-28 DIAGNOSIS — N6325 Unspecified lump in the left breast, overlapping quadrants: Secondary | ICD-10-CM

## 2021-12-28 DIAGNOSIS — Z853 Personal history of malignant neoplasm of breast: Secondary | ICD-10-CM

## 2021-12-28 DIAGNOSIS — N6489 Other specified disorders of breast: Secondary | ICD-10-CM | POA: Diagnosis not present

## 2021-12-28 DIAGNOSIS — R922 Inconclusive mammogram: Secondary | ICD-10-CM | POA: Diagnosis not present

## 2022-02-10 ENCOUNTER — Other Ambulatory Visit: Payer: Self-pay

## 2022-02-10 MED ORDER — ATORVASTATIN CALCIUM 10 MG PO TABS: 10.0000 mg | ORAL_TABLET | Freq: Every day | ORAL | 0 refills | Status: DC

## 2022-02-17 ENCOUNTER — Other Ambulatory Visit: Payer: Self-pay | Admitting: Internal Medicine

## 2022-03-09 ENCOUNTER — Other Ambulatory Visit: Payer: Self-pay | Admitting: Internal Medicine

## 2022-03-09 ENCOUNTER — Other Ambulatory Visit: Payer: Self-pay | Admitting: Family Medicine

## 2022-03-25 ENCOUNTER — Encounter: Payer: Medicare PPO | Admitting: Family Medicine

## 2022-05-18 NOTE — Progress Notes (Unsigned)
Cardiology Office Note:    Date:  05/19/2022   ID:  Lisa Salinas, DOB 10-27-1955, MRN [id number]  PCP:  Jinny Sanders, MD  [facility] Cardiologist:  Nelva Bush, MD  [place] Electrophysiologist:  None   Referring MD: Jinny Sanders, MD   Chief Complaint: 12 month follow-up  History of Present Illness:    Lisa Salinas is a 67 y.o. female with a hx of CAD with 60-70% p-m Lcx stenosis significant by FFR (0.76 in 02/2019) being managed medically, NSVT, recurrent breast cancer s/p lumpectomy and chemoradiation, and depression and anxiety who presents for 12 month follow-up.   Cardiac cath in 02/2019 circumflex segment 60-70% proximal to mid stenosis, FFR CT 0.76 beyond the stenosis, normal left main, 30% mLAD, 30% p-m RCA, LVEF 65%. Echo showed LVEF 55-60%, mild LVH, trace MR.   Last seen 04/2021 and was stable from a cardiac standpoint.   Today, the patient overall is doing well. She has occasional chest tightness worse with stress. She has family stressors. Sometimes she doesn't have time for her. She denies shortness of breath, lower leg edema, orthopnea or pnd. She walks three times a week 30-40 minutes. Diet is generally good.   Past Medical History:  Diagnosis Date   Anxiety state, unspecified    Basal cell carcinoma (BCC) of face    Breast cancer, right breast ([place]) 10/20/2004   S/P lumpectomy (04/2005) and chemo (03/2005)   Coronary artery disease 02/2019   60-70% prox-mid LCx stenosi (FFR 0.76) - medical management   Depression    Dizziness and giddiness    Dysrhythmia    Family history of breast cancer    Personal history of chemotherapy 03/2005   Personal history of radiation therapy 05/2005    Past Surgical History:  Procedure Laterality Date   ABDOMINAL HYSTERECTOMY     "left my ovaries; no cancer"   BASAL CELL CARCINOMA EXCISION Right    side of face    BREAST BIOPSY Bilateral    "2 on the left; 1 on the right; all benign"   BREAST BIOPSY  Right 10/20/2004   malignant   BREAST EXCISIONAL BIOPSY Left    BREAST LUMPECTOMY Right 04/2005   COLONOSCOPY     LEFT HEART CATH AND CORONARY ANGIOGRAPHY N/A 02/26/2019   Procedure: LEFT HEART CATH AND CORONARY ANGIOGRAPHY;  Surgeon: Belva Crome, MD;  Location: [place] [facility];  Service: Cardiovascular;  Laterality: N/A;   MASTECTOMY COMPLETE / SIMPLE Right 10/17/2016   MASTECTOMY W/ SENTINEL NODE BIOPSY Right 10/17/2016   Procedure: RIGHT MASTECTOMY WITH SENTINEL LYMPH NODE BIOPSY;  Surgeon: Fanny Skates, MD;  Location: [place];  Service: General;  Laterality: Right;   TONSILLECTOMY  Childhood    Current Medications: Current Meds  Medication Sig   aspirin EC 81 MG tablet Take 1 tablet (81 mg total) by mouth daily.   atorvastatin (LIPITOR) 10 MG tablet TAKE 1 TABLET BY MOUTH EVERY DAY   calcium citrate-vitamin D (CITRACAL+D) 315-200 MG-UNIT per tablet Take 1 tablet by mouth 2 (two) times daily.   ibandronate (BONIVA) 150 MG tablet Take 1 tablet (150 mg total) by mouth every 30 (thirty) days. Take in the morning with a full glass of water, on an empty stomach, and do not take anything else by mouth or lie down for the next 30 min.   metoprolol succinate (TOPROL-XL) 25 MG 24 hr tablet TAKE 1/2 TABLET BY MOUTH DAILY   nitroGLYCERIN (NITROSTAT) 0.4 MG SL  tablet Place 1 tablet (0.4 mg total) under the tongue every 5 (five) minutes as needed for chest pain.   sertraline (ZOLOFT) 100 MG tablet TAKE 1 TABLET BY MOUTH EVERY DAY   traZODone (DESYREL) 50 MG tablet TAKE 0.5-1 TABLETS BY MOUTH AT BEDTIME AS NEEDED FOR SLEEP.     Allergies:   Betadine [povidone iodine] and Sulfonamide derivatives   Social History   Socioeconomic History   Marital status: Married    Spouse name: Not on file   Number of children: Not on file   Years of education: Not on file   Highest education level: Not on file  Occupational History   Not on file  Tobacco Use   Smoking status: Never   Smokeless  tobacco: Never  Vaping Use   Vaping Use: Never used  Substance and Sexual Activity   Alcohol use: No   Drug use: No   Sexual activity: Not on file  Other Topics Concern   Not on file  Social History Narrative   MArried, [name]    2 kids age 65 and 48   Social Determinants of Health   Financial Resource Strain: Low Risk  (09/21/2021)   Overall Financial Resource Strain (CARDIA)    Difficulty of Paying Living Expenses: Not hard at all  Food Insecurity: No Food Insecurity (09/21/2021)   Hunger Vital Sign    Worried About Running Out of Food in the Last Year: Never true    [place] in the Last Year: Never true  Transportation Needs: No Transportation Needs (09/21/2021)   PRAPARE - Hydrologist (Medical): No    Lack of Transportation (Non-Medical): No  Physical Activity: Insufficiently Active (09/21/2021)   Exercise Vital Sign    Days of Exercise per Week: 3 days    Minutes of Exercise per Session: 30 min  Stress: No Stress Concern Present (09/21/2021)   [place]    Feeling of Stress : Only a little  Social Connections: Socially Integrated (09/21/2021)   Social Connection and Isolation Panel [NHANES]    Frequency of Communication with Friends and Family: More than three times a week    Frequency of Social Gatherings with Friends and Family: More than three times a week    Attends Religious Services: More than 4 times per year    Active Member of [employer] or Organizations: Yes    Attends Music therapist: More than 4 times per year    Marital Status: Married     Family History: The patient's family history includes Breast cancer (age of onset: 51) in her maternal aunt; Breast cancer (age of onset: 79) in her maternal aunt; Breast cancer (age of onset: 24) in her cousin; Breast cancer (age of onset: 87) in her paternal aunt; Cancer (age of onset: 71) in her father; Goiter in  her mother; Heart disease in her mother; Hyperlipidemia in her mother; Hypertension in her mother.  ROS:   Please see the history of present illness.     All other systems reviewed and are negative.  EKGs/Labs/Other Studies Reviewed:    The following studies were reviewed today:  Cardac cath 02/2019 Circumflex segmental 60 to 70% proximal to mid stenosis.  FFR CT 0.76 beyond the stenosis.  Symptoms are atypical Normal left main 30% mid LAD 30% proximal to mid RCA Normal LV function and LVEDP.  Ejection fraction 65%.   Recommendations:   Aggressive  preventive therapy: High intensity statin therapy with LDL target 55 or less.  Consider adding angiotensin blockade. Phase 2 cardiac rehab. Close clinical follow-up. If she develops exertional symptoms, would consider circumflex PCI but hopefully will with lifestyle changes and medical therapy. Recommendations  Antiplatelet/Anticoag Recommend Aspirin '81mg'$  daily for moderate CAD.   Echo 01/2019 1. Left ventricular ejection fraction, by visual estimation, is 55 to  60%. The left ventricle has normal function. Normal left ventricular size.  Left ventricular septal wall thickness was mildly increased. Mildly  increased left ventricular posterior  wall thickness. There is mildly increased left ventricular hypertrophy.   2. Left ventricular diastolic Doppler parameters are indeterminate  pattern of LV diastolic filling.   3. Global right ventricle has normal systolic function.The right  ventricular size is normal. No increase in right ventricular wall  thickness.   4. Left atrial size was normal.   5. Right atrial size was normal.   6. The mitral valve is normal in structure. Trace mitral valve  regurgitation.   7. The tricuspid valve is normal in structure. Tricuspid valve  regurgitation is trivial.   8. The aortic valve is tricuspid Aortic valve regurgitation was not  visualized by color flow Doppler. Structurally normal aortic  valve, with  no evidence of sclerosis or stenosis.   9. The pulmonic valve was grossly normal. Pulmonic valve regurgitation is  not visualized by color flow Doppler.  10. Normal pulmonary artery systolic pressure.  11. The inferior vena cava is normal in size with greater than 50%  respiratory variability, suggesting right atrial pressure of 3 mmHg.    EKG:  EKG is ordered today.  The ekg ordered today demonstrates NSR, 82bpm, TWI III  Recent Labs: 11/23/2021: Hemoglobin 13.5; Platelets 183.0; TSH 0.76  Recent Lipid Panel    Component Value Date/Time   CHOL 128 04/29/2021 1007   CHOL 115 11/04/2019 0843   TRIG 69 04/29/2021 1007   HDL 56 04/29/2021 1007   HDL 53 11/04/2019 0843   CHOLHDL 2.3 04/29/2021 1007   VLDL 14 04/29/2021 1007   LDLCALC 58 04/29/2021 1007   LDLCALC 48 11/04/2019 0843     Physical Exam:    VS:  BP 120/68 (BP Location: Left Arm, Patient Position: Sitting, Cuff Size: Normal)   Pulse 82   Ht '5\' 4"'$  (1.626 m)   Wt 172 lb 5.8 oz (78.2 kg)   SpO2 97%   BMI 29.59 kg/m     Wt Readings from Last 3 Encounters:  05/19/22 172 lb 5.8 oz (78.2 kg)  11/23/21 171 lb 4 oz (77.7 kg)  09/21/21 168 lb (76.2 kg)     GEN:  Well nourished, well developed in no acute distress HEENT: Normal NECK: No JVD; No carotid bruits LYMPHATICS: No lymphadenopathy CARDIAC: RRR, no murmurs, rubs, gallops RESPIRATORY:  Clear to auscultation without rales, wheezing or rhonchi  ABDOMEN: Soft, non-tender, non-distended MUSCULOSKELETAL:  No edema; No deformity  SKIN: Warm and dry NEUROLOGIC:  Alert and oriented x 3 PSYCHIATRIC:  Normal affect   ASSESSMENT:    1. Coronary artery disease involving native coronary artery of native heart without angina pectoris   2. Palpitations   3. Hyperlipidemia, mixed    PLAN:    In order of problems listed above:  CAD by cath in 2020 Patient reports chest tightness in the setting of stress. She walks three times a week and eats fairly  healthy. No further ischemic work-up indicated at this time. Continue Aspirin, Lipitor and BB  therapy.    Palpitations Palpitations are well controlled on Toprol.   HLD Last LDL 58 at goal. PCP will update cholesterol panel this year. Continue Lipitor '10mg'$  daily.   Disposition: Follow up in 1 year(s) with MD/APP     Signed, Katisha Shimizu Ninfa Meeker, PA-C  05/19/2022 9:49 AM    [place]

## 2022-05-19 ENCOUNTER — Ambulatory Visit: Payer: Medicare PPO | Attending: Medical | Admitting: Medical

## 2022-05-19 ENCOUNTER — Encounter: Payer: Self-pay | Admitting: Medical

## 2022-05-19 VITALS — BP 120/68 | HR 82 | Ht 64.0 in | Wt 172.4 lb

## 2022-05-19 DIAGNOSIS — I2510 Atherosclerotic heart disease of native coronary artery without angina pectoris: Secondary | ICD-10-CM | POA: Diagnosis not present

## 2022-05-19 DIAGNOSIS — E782 Mixed hyperlipidemia: Secondary | ICD-10-CM | POA: Diagnosis not present

## 2022-05-19 DIAGNOSIS — R002 Palpitations: Secondary | ICD-10-CM

## 2022-05-19 NOTE — Patient Instructions (Signed)
Medication Instructions:  No changes at this time.   *If you need a refill on your cardiac medications before your next appointment, please call your pharmacy*   Lab Work: None  If you have labs (blood work) drawn today and your tests are completely normal, you will receive your results only by: [place] (if you have MyChart) OR A paper copy in the mail If you have any lab test that is abnormal or we need to change your treatment, we will call you to review the results.   Testing/Procedures: None   Follow-Up: At [facility], you and your health needs are our priority.  As part of our continuing mission to provide you with exceptional heart care, we have created designated Provider Care Teams.  These Care Teams include your primary Cardiologist (physician) and Advanced Practice Providers (APPs -  Physician Assistants and Nurse Practitioners) who all work together to provide you with the care you need, when you need it.   Your next appointment:   1 year(s)  Provider:   Nelva Bush, MD

## 2022-05-27 NOTE — Addendum Note (Signed)
Addended by: James Ivanoff D on: 05/27/2022 09:10 AM   Modules accepted: Orders

## 2022-06-02 ENCOUNTER — Telehealth (INDEPENDENT_AMBULATORY_CARE_PROVIDER_SITE_OTHER): Payer: Medicare PPO | Admitting: Family Medicine

## 2022-06-02 ENCOUNTER — Encounter: Payer: Self-pay | Admitting: Family Medicine

## 2022-06-02 VITALS — HR 96 | Ht 65.0 in

## 2022-06-02 DIAGNOSIS — U071 COVID-19: Secondary | ICD-10-CM

## 2022-06-02 MED ORDER — NIRMATRELVIR/RITONAVIR (PAXLOVID)TABLET: 3.0000 | ORAL_TABLET | Freq: Two times a day (BID) | ORAL | 0 refills | Status: AC

## 2022-06-02 NOTE — Progress Notes (Signed)
VIRTUAL VISIT A virtual visit is felt to be most appropriate for this patient at this time.   I connected with the patient on 06/02/22 at 11:00 AM EST by virtual telehealth platform and verified that I am speaking with the correct person using two identifiers.   I discussed the limitations, risks, security and privacy concerns of performing an evaluation and management service by  virtual telehealth platform and the availability of in person appointments. I also discussed with the patient that there may be a patient responsible charge related to this service. The patient expressed understanding and agreed to proceed.  Patient location: Home Provider Location: [place] Participants: Eliezer Lofts and [name]   Chief Complaint  Patient presents with   Covid Positive    Positive home test this morning  Symptoms started yesterday   Fever   Cough   Generalized Body Aches         History of Present Illness:  67 y.o. female  with history of coronary artery,  disease presents with  COVID infection    Date of onset: February 14 Initial symptoms included ST, runny nose, headache, generalized bodyaches Symptoms progressed to productive cough and fever (101.49F     Sick contacts: sick grand kids COVID testing:   yes     She has tried to treat with  aleve,  off brand Nyquil med     No history of chronic lung disease such as asthma or COPD. Non-smoker.    COVID 19 screen No recent travel or known exposure to COVID19 The patient denies respiratory symptoms of COVID 19 at this time.  The importance of social distancing was discussed today.   Review of Systems  Constitutional:  Positive for chills, fever and malaise/fatigue.  HENT:  Positive for congestion and sore throat. Negative for ear pain.   Eyes:  Negative for pain and redness.  Respiratory:  Positive for cough. Negative for shortness of breath.   Cardiovascular:  Negative for chest pain, palpitations  and leg swelling.  Gastrointestinal:  Negative for abdominal pain, blood in stool, constipation, diarrhea, nausea and vomiting.  Genitourinary:  Negative for dysuria.  Musculoskeletal:  Negative for falls and myalgias.  Skin:  Negative for rash.  Neurological:  Negative for dizziness.  Psychiatric/Behavioral:  Negative for depression. The patient is not nervous/anxious.       Past Medical History:  Diagnosis Date   Anxiety state, unspecified    Basal cell carcinoma (BCC) of face    Breast cancer, right breast ([place]) 10/20/2004   S/P lumpectomy (04/2005) and chemo (03/2005)   Coronary artery disease 02/2019   60-70% prox-mid LCx stenosi (FFR 0.76) - medical management   Depression    Dizziness and giddiness    Dysrhythmia    Family history of breast cancer    Personal history of chemotherapy 03/2005   Personal history of radiation therapy 05/2005    reports that she has never smoked. She has never used smokeless tobacco. She reports that she does not drink alcohol and does not use drugs.   Current Outpatient Medications:    aspirin EC 81 MG tablet, Take 1 tablet (81 mg total) by mouth daily., Disp: 90 tablet, Rfl: 3   atorvastatin (LIPITOR) 10 MG tablet, TAKE 1 TABLET BY MOUTH EVERY DAY, Disp: 90 tablet, Rfl: 0   calcium citrate-vitamin D (CITRACAL+D) 315-200 MG-UNIT per tablet, Take 1 tablet by mouth 2 (two) times daily., Disp: , Rfl:    ibandronate (BONIVA) 150  MG tablet, Take 1 tablet (150 mg total) by mouth every 30 (thirty) days. Take in the morning with a full glass of water, on an empty stomach, and do not take anything else by mouth or lie down for the next 30 min., Disp: 3 tablet, Rfl: 3   metoprolol succinate (TOPROL-XL) 25 MG 24 hr tablet, TAKE 1/2 TABLET BY MOUTH DAILY, Disp: 45 tablet, Rfl: 0   nirmatrelvir/ritonavir (PAXLOVID) 20 x 150 MG & 10 x 100MG TABS, Take 3 tablets by mouth 2 (two) times daily for 5 days. (Take nirmatrelvir 150 mg two tablets twice daily for 5 days  and ritonavir 100 mg one tablet twice daily for 5 days) Patient GFR is >60, Disp: 30 tablet, Rfl: 0   nitroGLYCERIN (NITROSTAT) 0.4 MG SL tablet, Place 1 tablet (0.4 mg total) under the tongue every 5 (five) minutes as needed for chest pain., Disp: 25 tablet, Rfl: 3   sertraline (ZOLOFT) 100 MG tablet, TAKE 1 TABLET BY MOUTH EVERY DAY, Disp: 90 tablet, Rfl: 1   traZODone (DESYREL) 50 MG tablet, TAKE 0.5-1 TABLETS BY MOUTH AT BEDTIME AS NEEDED FOR SLEEP., Disp: 90 tablet, Rfl: 0   Observations/Objective: Pulse 96, height 5' 5"$  (1.651 m), SpO2 97 %.  Physical Exam Constitutional:      General: The patient is not in acute distress. Pulmonary:     Effort: Pulmonary effort is normal. No respiratory distress.  Neurological:     Mental Status: The patient is alert and oriented to person, place, and time.  Psychiatric:        Mood and Affect: Mood normal.        Behavior: Behavior normal.    Assessment and Plan COVID-19 virus infection Assessment & Plan: COVID19  Infection < 5 days from onset of symptoms in  vaccinated overweight individual with history of CAD  No clear sign of bacterial infection at this time.   No SOB.  No red flags/need for ER visit or in-person exam at respiratory clinic at this time..    Pt higher risk for COVID complications given  age and CAD. GFR  >60 and no medication contraindications.  Start paxlovid 5 day course. Reviewed course of medication and side effect profile with patient in detail. HOLD TRAZODONE ANS STATIN WHILE ON PAXLOVID   Symptomatic care with mucinex and cough suppressant at night. If SOB begins symptoms worsening.. have low threshold for in-person exam, if severe shortness of breath ER visit recommended.  Can monitor Oxygen saturation at home with home monitor if able to obtain.  Go to ER if O2 sat < 90% on room air.   Reviewed home care and provided information through [place].  Recommended quarantine 5 days isolation recommended. Return to  work day 6 and wear mask for 4 more days to complete 10 days. Provided info about prevention of spread of COVID 19.    Other orders -     nirmatrelvir/ritonavir; Take 3 tablets by mouth 2 (two) times daily for 5 days. (Take nirmatrelvir 150 mg two tablets twice daily for 5 days and ritonavir 100 mg one tablet twice daily for 5 days) Patient GFR is >60  Dispense: 30 tablet; Refill: 0      I discussed the assessment and treatment plan with the patient. The patient was provided an opportunity to ask questions and all were answered. The patient agreed with the plan and demonstrated an understanding of the instructions.   The patient was advised to call back or seek an  in-person evaluation if the symptoms worsen or if the condition fails to improve as anticipated.     Eliezer Lofts, MD

## 2022-06-02 NOTE — Assessment & Plan Note (Signed)
COVID19  Infection < 5 days from onset of symptoms in  vaccinated overweight individual with history of CAD  No clear sign of bacterial infection at this time.   No SOB.  No red flags/need for ER visit or in-person exam at respiratory clinic at this time..    Pt higher risk for COVID complications given  age and CAD. GFR  >60 and no medication contraindications.  Start paxlovid 5 day course. Reviewed course of medication and side effect profile with patient in detail. HOLD TRAZODONE ANS STATIN WHILE ON PAXLOVID   Symptomatic care with mucinex and cough suppressant at night. If SOB begins symptoms worsening.. have low threshold for in-person exam, if severe shortness of breath ER visit recommended.  Can monitor Oxygen saturation at home with home monitor if able to obtain.  Go to ER if O2 sat < 90% on room air.   Reviewed home care and provided information through [place].  Recommended quarantine 5 days isolation recommended. Return to work day 6 and wear mask for 4 more days to complete 10 days. Provided info about prevention of spread of COVID 19.

## 2022-07-06 DIAGNOSIS — D225 Melanocytic nevi of trunk: Secondary | ICD-10-CM | POA: Diagnosis not present

## 2022-07-06 DIAGNOSIS — D2371 Other benign neoplasm of skin of right lower limb, including hip: Secondary | ICD-10-CM | POA: Diagnosis not present

## 2022-07-06 DIAGNOSIS — D2262 Melanocytic nevi of left upper limb, including shoulder: Secondary | ICD-10-CM | POA: Diagnosis not present

## 2022-07-06 DIAGNOSIS — L821 Other seborrheic keratosis: Secondary | ICD-10-CM | POA: Diagnosis not present

## 2022-07-06 DIAGNOSIS — L814 Other melanin hyperpigmentation: Secondary | ICD-10-CM | POA: Diagnosis not present

## 2022-07-06 DIAGNOSIS — D2261 Melanocytic nevi of right upper limb, including shoulder: Secondary | ICD-10-CM | POA: Diagnosis not present

## 2022-07-06 DIAGNOSIS — Z85828 Personal history of other malignant neoplasm of skin: Secondary | ICD-10-CM | POA: Diagnosis not present

## 2022-07-06 DIAGNOSIS — L720 Epidermal cyst: Secondary | ICD-10-CM | POA: Diagnosis not present

## 2022-07-29 ENCOUNTER — Other Ambulatory Visit: Payer: Self-pay | Admitting: Internal Medicine

## 2022-09-09 ENCOUNTER — Other Ambulatory Visit: Payer: Self-pay | Admitting: Family Medicine

## 2022-09-22 ENCOUNTER — Ambulatory Visit: Payer: Medicare PPO | Admitting: Family Medicine

## 2022-09-22 ENCOUNTER — Encounter: Payer: Self-pay | Admitting: Family Medicine

## 2022-09-22 ENCOUNTER — Ambulatory Visit (INDEPENDENT_AMBULATORY_CARE_PROVIDER_SITE_OTHER)
Admission: RE | Admit: 2022-09-22 | Discharge: 2022-09-22 | Disposition: A | Discharge status: Home / Self Care | Payer: Medicare PPO | Source: Ambulatory Visit | Attending: Family Medicine | Admitting: Family Medicine

## 2022-09-22 VITALS — BP 100/60 | HR 80 | Temp 98.5°F | Resp 16 | Ht 65.0 in | Wt 172.5 lb

## 2022-09-22 DIAGNOSIS — M25531 Pain in right wrist: Secondary | ICD-10-CM | POA: Diagnosis not present

## 2022-09-22 DIAGNOSIS — W19XXXA Unspecified fall, initial encounter: Secondary | ICD-10-CM

## 2022-09-22 MED ORDER — MELOXICAM 15 MG PO TABS: 15.0000 mg | ORAL_TABLET | Freq: Every day | ORAL | 0 refills | Status: DC

## 2022-09-22 NOTE — Progress Notes (Signed)
Patient ID: CHRYSTLE GALLUS, female    DOB: 01/05/1956, 67 y.o.   MRN: [id number]  This visit was conducted in person.  BP 100/60   Pulse 80   Temp 98.5 F (36.9 C)   Resp 16   Ht 5\' 5"  (1.651 m)   Wt 172 lb 8 oz (78.2 kg)   SpO2 98%   BMI 28.71 kg/m    CC:  Chief Complaint  Patient presents with   Hand Pain    Right hand and has shooting pain. Pt  has had this going on for a while    Subjective:   HPI: LACORA CAMERA is a 67 y.o. female presenting on 09/22/2022 for Hand Pain (Right hand and has shooting pain. Pt  has had this going on for a while)  New onset pain in right hand. In last month.  After using each day, pain becomes unbearable, sharp.  Pain is achy all the time but sharp shooting pain when using right hand.  Pain located along side of inky into wrist and on dorsal hand.  Pain greatest with gripping and lifting cup, putting on seatbelt.   Fell 1.5 month ago... lost balance.. landed straight forward and caught slef ton outstretched hands.  Some achiness after the fall, but not clearly.  No swelling, no bruising.     She has used tylenol for pain.  Hx of osteoporosis. 11/2021 T-2.6  Relevant past medical, surgical, family and social history reviewed and updated as indicated. Interim medical history since our last visit reviewed. Allergies and medications reviewed and updated. Outpatient Medications Prior to Visit  Medication Sig Dispense Refill   aspirin EC 81 MG tablet Take 1 tablet (81 mg total) by mouth daily. 90 tablet 3   atorvastatin (LIPITOR) 10 MG tablet TAKE 1 TABLET BY MOUTH EVERY DAY 90 tablet 2   calcium citrate-vitamin D (CITRACAL+D) 315-200 MG-UNIT per tablet Take 1 tablet by mouth 2 (two) times daily.     ibandronate (BONIVA) 150 MG tablet Take 1 tablet (150 mg total) by mouth every 30 (thirty) days. Take in the morning with a full glass of water, on an empty stomach, and do not take anything else by mouth or lie down for the next 30 min. 3  tablet 3   metoprolol succinate (TOPROL-XL) 25 MG 24 hr tablet TAKE 1/2 TABLET BY MOUTH DAILY 45 tablet 2   nitroGLYCERIN (NITROSTAT) 0.4 MG SL tablet Place 1 tablet (0.4 mg total) under the tongue every 5 (five) minutes as needed for chest pain. 25 tablet 3   sertraline (ZOLOFT) 100 MG tablet TAKE 1 TABLET BY MOUTH EVERY DAY 90 tablet 0   traZODone (DESYREL) 50 MG tablet TAKE 0.5-1 TABLETS BY MOUTH AT BEDTIME AS NEEDED FOR SLEEP. 90 tablet 0   No facility-administered medications prior to visit.     Per HPI unless specifically indicated in ROS section below Review of Systems  Constitutional:  Negative for fatigue and fever.  HENT:  Negative for congestion.   Eyes:  Negative for pain.  Respiratory:  Negative for cough and shortness of breath.   Cardiovascular:  Negative for chest pain, palpitations and leg swelling.  Gastrointestinal:  Negative for abdominal pain.  Genitourinary:  Negative for dysuria and vaginal bleeding.  Musculoskeletal:  Negative for back pain.  Neurological:  Negative for syncope, light-headedness and headaches.  Psychiatric/Behavioral:  Negative for dysphoric mood.    Objective:  BP 100/60   Pulse 80   Temp 98.5  F (36.9 C)   Resp 16   Ht 5\' 5"  (1.651 m)   Wt 172 lb 8 oz (78.2 kg)   SpO2 98%   BMI 28.71 kg/m   Wt Readings from Last 3 Encounters:  09/22/22 172 lb 8 oz (78.2 kg)  05/19/22 172 lb 5.8 oz (78.2 kg)  11/23/21 171 lb 4 oz (77.7 kg)      Physical Exam Constitutional:      General: She is not in acute distress.    Appearance: Normal appearance. She is well-developed. She is not ill-appearing or toxic-appearing.  HENT:     Head: Normocephalic.     Right Ear: Hearing, tympanic membrane, ear canal and external ear normal. Tympanic membrane is not erythematous, retracted or bulging.     Left Ear: Hearing, tympanic membrane, ear canal and external ear normal. Tympanic membrane is not erythematous, retracted or bulging.     Nose: No mucosal  edema or rhinorrhea.     Right Sinus: No maxillary sinus tenderness or frontal sinus tenderness.     Left Sinus: No maxillary sinus tenderness or frontal sinus tenderness.     Mouth/Throat:     Pharynx: Uvula midline.  Eyes:     General: Lids are normal. Lids are everted, no foreign bodies appreciated.     Conjunctiva/sclera: Conjunctivae normal.     Pupils: Pupils are equal, round, and reactive to light.  Neck:     Thyroid: No thyroid mass or thyromegaly.     Vascular: No carotid bruit.     Trachea: Trachea normal.  Cardiovascular:     Rate and Rhythm: Normal rate and regular rhythm.     Pulses: Normal pulses.     Heart sounds: Normal heart sounds, S1 normal and S2 normal. No murmur heard.    No friction rub. No gallop.  Pulmonary:     Effort: Pulmonary effort is normal. No tachypnea or respiratory distress.     Breath sounds: Normal breath sounds. No decreased breath sounds, wheezing, rhonchi or rales.  Abdominal:     General: Bowel sounds are normal.     Palpations: Abdomen is soft.     Tenderness: There is no abdominal tenderness.  Musculoskeletal:     Right shoulder: Normal.     Left shoulder: Normal.     Right upper arm: Normal.     Left upper arm: Normal.     Right elbow: Normal.     Left elbow: Normal.     Right forearm: Normal.     Left forearm: Normal.     Right wrist: Tenderness and bony tenderness present. No swelling, snuff box tenderness or crepitus. Decreased range of motion. Normal pulse.     Left wrist: Normal.     Right hand: Tenderness present. No swelling, deformity or bony tenderness. Normal range of motion. Normal strength. Normal sensation.     Cervical back: Normal range of motion and neck supple.     Comments: Tender to palpation midline over her wrist most focally, tenderness with use of interosseous muscles of hand and over dorsal hand diffusely  Skin:    General: Skin is warm and dry.     Findings: No rash.  Neurological:     Mental Status: She  is alert.  Psychiatric:        Mood and Affect: Mood is not anxious or depressed.        Speech: Speech normal.        Behavior: Behavior normal. Behavior is cooperative.  Thought Content: Thought content normal.        Judgment: Judgment normal.       Results for orders placed or performed in visit on 11/23/21  CBC with Differential/Platelet  Result Value Ref Range   WBC 5.2 4.0 - 10.5 K/uL   RBC 4.61 3.87 - 5.11 Mil/uL   Hemoglobin 13.5 12.0 - 15.0 g/dL   HCT 57.8 46.9 - 62.9 %   MCV 89.2 78.0 - 100.0 fl   MCHC 33.0 30.0 - 36.0 g/dL   RDW 52.8 41.3 - 24.4 %   Platelets 183.0 150.0 - 400.0 K/uL   Neutrophils Relative % 48.9 43.0 - 77.0 %   Lymphocytes Relative 39.4 12.0 - 46.0 %   Monocytes Relative 8.4 3.0 - 12.0 %   Eosinophils Relative 2.9 0.0 - 5.0 %   Basophils Relative 0.4 0.0 - 3.0 %   Neutro Abs 2.6 1.4 - 7.7 K/uL   Lymphs Abs 2.1 0.7 - 4.0 K/uL   Monocytes Absolute 0.4 0.1 - 1.0 K/uL   Eosinophils Absolute 0.1 0.0 - 0.7 K/uL   Basophils Absolute 0.0 0.0 - 0.1 K/uL  VITAMIN D 25 Hydroxy (Vit-D Deficiency, Fractures)  Result Value Ref Range   VITD 30.22 30.00 - 100.00 ng/mL  TSH  Result Value Ref Range   TSH 0.76 0.35 - 5.50 uIU/mL  T4, free  Result Value Ref Range   Free T4 0.82 0.60 - 1.60 ng/dL  T3, free  Result Value Ref Range   T3, Free 2.8 2.3 - 4.2 pg/mL  Vitamin B12  Result Value Ref Range   Vitamin B-12 238 211 - 911 pg/mL    Assessment and Plan  Right wrist pain Assessment & Plan: Acute, status post fall 1-1/2 months ago.  Tenderness to palpation over wrist centrally, concerning for fracture given patient's history of osteoporosis.  Will evaluate with plain film.  If negative most likely diagnosis is combination of hand sprain and tendinitis on ulnar aspect of wrist.  Treat with ice, elevation, meloxicam 15 mg p.o. daily in place of Tylenol.  If x-rays negative given information for wrist/hand sprain rehabilitation.  Orders: -     DG  Wrist Complete Right; Future  Fall, initial encounter -     DG Wrist Complete Right; Future  Other orders -     Meloxicam; Take 1 tablet (15 mg total) by mouth daily.  Dispense: 30 tablet; Refill: 0    No follow-ups on file.   Kerby Nora, MD

## 2022-09-22 NOTE — Assessment & Plan Note (Signed)
Acute, status post fall 1-1/2 months ago.  Tenderness to palpation over wrist centrally, concerning for fracture given patient's history of osteoporosis.  Will evaluate with plain film.  If negative most likely diagnosis is combination of hand sprain and tendinitis on ulnar aspect of wrist.  Treat with ice, elevation, meloxicam 15 mg p.o. daily in place of Tylenol.  If x-rays negative given information for wrist/hand sprain rehabilitation.

## 2022-10-19 ENCOUNTER — Other Ambulatory Visit: Payer: Self-pay | Admitting: Family Medicine

## 2022-10-19 NOTE — Telephone Encounter (Signed)
Last office visit 09/22/2022 for right wrist pain.  Last refilled 09/22/22 for #30 with no refills.  Next appt: No future appointments.

## 2022-11-04 DIAGNOSIS — M545 Low back pain, unspecified: Secondary | ICD-10-CM | POA: Diagnosis not present

## 2022-11-04 DIAGNOSIS — N390 Urinary tract infection, site not specified: Secondary | ICD-10-CM | POA: Diagnosis not present

## 2022-11-23 ENCOUNTER — Ambulatory Visit: Payer: Medicare PPO | Admitting: Family Medicine

## 2022-11-23 ENCOUNTER — Other Ambulatory Visit: Payer: Self-pay | Admitting: Family Medicine

## 2022-11-23 ENCOUNTER — Encounter: Payer: Self-pay | Admitting: Family Medicine

## 2022-11-23 VITALS — BP 110/70 | HR 75 | Temp 97.5°F | Ht 65.0 in | Wt 167.0 lb

## 2022-11-23 DIAGNOSIS — R051 Acute cough: Secondary | ICD-10-CM

## 2022-11-23 MED ORDER — IBANDRONATE SODIUM 150 MG PO TABS: 150.0000 mg | ORAL_TABLET | ORAL | 3 refills | Status: DC

## 2022-11-23 MED ORDER — AZITHROMYCIN 250 MG PO TABS: ORAL_TABLET | ORAL | 0 refills | Status: AC

## 2022-11-23 NOTE — Telephone Encounter (Signed)
Last office visit 09/22/22 for Fall and right wrist pain.  Last refilled 10/19/22 for #30 with no refills.  Next Appt: 11/23/22 for cough.

## 2022-11-23 NOTE — Assessment & Plan Note (Signed)
Acute, most likely viral upper respiratory tract infection but given persistent symptoms past 1 week we will treat with antibiotics for possible bacterial superinfection.  Start cough suppressant such as Mucinex DM or Delsym as needed for cough.  Push fluids and rest.  Return and ER precautions provided

## 2022-11-23 NOTE — Progress Notes (Signed)
Patient ID: Lisa Salinas, female    DOB: Jan 19, 1956, 67 y.o.   MRN: [id number]  This visit was conducted in person.  BP 110/70 (BP Location: Left Arm, Patient Position: Sitting, Cuff Size: Normal)   Pulse 75   Temp (!) 97.5 F (36.4 C) (Temporal)   Ht 5\' 5"  (1.651 m)   Wt 167 lb (75.8 kg)   SpO2 96%   BMI 27.79 kg/m    CC:  Chief Complaint  Patient presents with   Cough    And sore throat x 7-8 days. Sore throat had went away but came back. Cough is deep and can be productive at times. Did take covid test about 4-5 days ago and was negative. Taking tylenol cold and flu.    Subjective:   HPI: Lisa Salinas is a 67 y.o. female presenting on 11/23/2022 for Cough (And sore throat x 7-8 days. Sore throat had went away but came back. Cough is deep and can be productive at times. Did take covid test about 4-5 days ago and was negative. Taking tylenol cold and flu.)   Date of onset: 1 week Initial symptoms included  ST, that went away then returned. Pressure/noise in right ear. Symptoms progressed to deep productive cough.  No ear pain, no sinus pain.  No fever... hot sweats one morning.  No body aches.  Some SOB with coughing fits.    Fatigue.. wears out easily.   Sick contacts:  none... jut got back from the beach COVID testing:   negative day 4 of illness     She has tried to treat with  throat lozanges, tylenol cold and flu.     No history of chronic lung disease such as asthma or COPD. Non-smoker.       Relevant past medical, surgical, family and social history reviewed and updated as indicated. Interim medical history since our last visit reviewed. Allergies and medications reviewed and updated. Outpatient Medications Prior to Visit  Medication Sig Dispense Refill   aspirin EC 81 MG tablet Take 1 tablet (81 mg total) by mouth daily. 90 tablet 3   atorvastatin (LIPITOR) 10 MG tablet TAKE 1 TABLET BY MOUTH EVERY DAY 90 tablet 2   calcium citrate-vitamin D  (CITRACAL+D) 315-200 MG-UNIT per tablet Take 1 tablet by mouth 2 (two) times daily.     meloxicam (MOBIC) 15 MG tablet TAKE 1 TABLET (15 MG TOTAL) BY MOUTH DAILY. 30 tablet 0   metoprolol succinate (TOPROL-XL) 25 MG 24 hr tablet TAKE 1/2 TABLET BY MOUTH DAILY 45 tablet 2   nitroGLYCERIN (NITROSTAT) 0.4 MG SL tablet Place 1 tablet (0.4 mg total) under the tongue every 5 (five) minutes as needed for chest pain. 25 tablet 3   sertraline (ZOLOFT) 100 MG tablet TAKE 1 TABLET BY MOUTH EVERY DAY 90 tablet 0   traZODone (DESYREL) 50 MG tablet TAKE 0.5-1 TABLETS BY MOUTH AT BEDTIME AS NEEDED FOR SLEEP. 90 tablet 0   ibandronate (BONIVA) 150 MG tablet Take 1 tablet (150 mg total) by mouth every 30 (thirty) days. Take in the morning with a full glass of water, on an empty stomach, and do not take anything else by mouth or lie down for the next 30 min. 3 tablet 3   No facility-administered medications prior to visit.     Per HPI unless specifically indicated in ROS section below Review of Systems  Constitutional:  Negative for fatigue and fever.  HENT:  Positive for sore throat. Negative  for congestion.   Eyes:  Negative for pain.  Respiratory:  Positive for cough. Negative for shortness of breath.   Cardiovascular:  Negative for chest pain, palpitations and leg swelling.  Gastrointestinal:  Negative for abdominal pain.  Genitourinary:  Negative for dysuria and vaginal bleeding.  Musculoskeletal:  Negative for back pain.  Neurological:  Negative for syncope, light-headedness and headaches.  Psychiatric/Behavioral:  Negative for dysphoric mood.    Objective:  BP 110/70 (BP Location: Left Arm, Patient Position: Sitting, Cuff Size: Normal)   Pulse 75   Temp (!) 97.5 F (36.4 C) (Temporal)   Ht 5\' 5"  (1.651 m)   Wt 167 lb (75.8 kg)   SpO2 96%   BMI 27.79 kg/m   Wt Readings from Last 3 Encounters:  11/23/22 167 lb (75.8 kg)  09/22/22 172 lb 8 oz (78.2 kg)  05/19/22 172 lb 5.8 oz (78.2 kg)       Physical Exam Constitutional:      General: She is not in acute distress.    Appearance: Normal appearance. She is well-developed. She is not ill-appearing or toxic-appearing.  HENT:     Head: Normocephalic.     Right Ear: Hearing, tympanic membrane, ear canal and external ear normal. Tympanic membrane is not erythematous, retracted or bulging.     Left Ear: Hearing, tympanic membrane, ear canal and external ear normal. Tympanic membrane is not erythematous, retracted or bulging.     Nose: No mucosal edema or rhinorrhea.     Right Sinus: No maxillary sinus tenderness or frontal sinus tenderness.     Left Sinus: No maxillary sinus tenderness or frontal sinus tenderness.     Mouth/Throat:     Mouth: Oropharynx is clear and moist and mucous membranes are normal.     Pharynx: Uvula midline.  Eyes:     General: Lids are normal. Lids are everted, no foreign bodies appreciated.     Extraocular Movements: EOM normal.     Conjunctiva/sclera: Conjunctivae normal.     Pupils: Pupils are equal, round, and reactive to light.  Neck:     Thyroid: No thyroid mass or thyromegaly.     Vascular: No carotid bruit.     Trachea: Trachea normal.  Cardiovascular:     Rate and Rhythm: Normal rate and regular rhythm.     Pulses: Normal pulses.     Heart sounds: Normal heart sounds, S1 normal and S2 normal. No murmur heard.    No friction rub. No gallop.  Pulmonary:     Effort: Pulmonary effort is normal. No tachypnea or respiratory distress.     Breath sounds: Normal breath sounds. No decreased breath sounds, wheezing, rhonchi or rales.  Abdominal:     General: Bowel sounds are normal.     Palpations: Abdomen is soft.     Tenderness: There is no abdominal tenderness.  Musculoskeletal:     Cervical back: Normal range of motion and neck supple.  Skin:    General: Skin is warm, dry and intact.     Findings: No rash.  Neurological:     Mental Status: She is alert.  Psychiatric:        Mood and Affect:  Mood is not anxious or depressed.        Speech: Speech normal.        Behavior: Behavior normal. Behavior is cooperative.        Thought Content: Thought content normal.        Cognition and Memory: Cognition and  memory normal.        Judgment: Judgment normal.       Results for orders placed or performed in visit on 11/23/21  CBC with Differential/Platelet  Result Value Ref Range   WBC 5.2 4.0 - 10.5 K/uL   RBC 4.61 3.87 - 5.11 Mil/uL   Hemoglobin 13.5 12.0 - 15.0 g/dL   HCT 24.4 01.0 - 27.2 %   MCV 89.2 78.0 - 100.0 fl   MCHC 33.0 30.0 - 36.0 g/dL   RDW 53.6 64.4 - 03.4 %   Platelets 183.0 150.0 - 400.0 K/uL   Neutrophils Relative % 48.9 43.0 - 77.0 %   Lymphocytes Relative 39.4 12.0 - 46.0 %   Monocytes Relative 8.4 3.0 - 12.0 %   Eosinophils Relative 2.9 0.0 - 5.0 %   Basophils Relative 0.4 0.0 - 3.0 %   Neutro Abs 2.6 1.4 - 7.7 K/uL   Lymphs Abs 2.1 0.7 - 4.0 K/uL   Monocytes Absolute 0.4 0.1 - 1.0 K/uL   Eosinophils Absolute 0.1 0.0 - 0.7 K/uL   Basophils Absolute 0.0 0.0 - 0.1 K/uL  VITAMIN D 25 Hydroxy (Vit-D Deficiency, Fractures)  Result Value Ref Range   VITD 30.22 30.00 - 100.00 ng/mL  TSH  Result Value Ref Range   TSH 0.76 0.35 - 5.50 uIU/mL  T4, free  Result Value Ref Range   Free T4 0.82 0.60 - 1.60 ng/dL  T3, free  Result Value Ref Range   T3, Free 2.8 2.3 - 4.2 pg/mL  Vitamin B12  Result Value Ref Range   Vitamin B-12 238 211 - 911 pg/mL    Assessment and Plan  Acute cough Assessment & Plan: Acute, most likely viral upper respiratory tract infection but given persistent symptoms past 1 week we will treat with antibiotics for possible bacterial superinfection.  Start cough suppressant such as Mucinex DM or Delsym as needed for cough.  Push fluids and rest.  Return and ER precautions provided   Other orders -     Ibandronate Sodium; Take 1 tablet (150 mg total) by mouth every 30 (thirty) days. Take in the morning with a full glass of water, on  an empty stomach, and do not take anything else by mouth or lie down for the next 30 min.  Dispense: 3 tablet; Refill: 3 -     Azithromycin; Take 2 tablets on day 1, then 1 tablet daily on days 2 through 5  Dispense: 6 tablet; Refill: 0    No follow-ups on file.   Kerby Nora, MD

## 2022-12-17 ENCOUNTER — Other Ambulatory Visit: Payer: Self-pay | Admitting: Family Medicine

## 2022-12-20 NOTE — Telephone Encounter (Signed)
LVM for patient to c/b and schedule.  

## 2022-12-20 NOTE — Telephone Encounter (Signed)
Patient has been scheduled

## 2022-12-20 NOTE — Telephone Encounter (Signed)
Please schedule Medicare Wellness with nurse and CPE with fasting labs prior with Dr. Bedsole.   

## 2023-01-03 ENCOUNTER — Ambulatory Visit (INDEPENDENT_AMBULATORY_CARE_PROVIDER_SITE_OTHER): Payer: Medicare PPO

## 2023-01-03 VITALS — Ht 65.0 in | Wt 167.0 lb

## 2023-01-03 DIAGNOSIS — Z0000 Encounter for general adult medical examination without abnormal findings: Secondary | ICD-10-CM | POA: Diagnosis not present

## 2023-01-03 NOTE — Progress Notes (Signed)
Subjective:   Lisa Salinas is a 67 y.o. female who presents for Medicare Annual (Subsequent) preventive examination.  Visit Complete: Virtual  I connected with  Lisa Salinas on 01/03/23 by a audio enabled telemedicine application and verified that I am speaking with the correct person using two identifiers.  Patient Location: Home  Provider Location: Office/Clinic  I discussed the limitations of evaluation and management by telemedicine. The patient expressed understanding and agreed to proceed.  Vital Signs: Unable to obtain new vitals due to this being a telehealth visit.  Patient Medicare AWV questionnaire was completed by the patient on (not done); I have confirmed that all information answered by patient is correct and no changes since this date. Cardiac Risk Factors include: advanced age (>20men, >28 women);dyslipidemia    Objective:    Today's Vitals   01/03/23 1305  Weight: 167 lb (75.8 kg)  Height: 5\' 5"  (1.651 m)   Body mass index is 27.79 kg/m.     01/03/2023    1:17 PM 09/21/2021    9:45 AM 09/21/2021    9:44 AM 02/26/2019    5:56 AM 11/24/2016    2:44 PM 10/17/2016    6:03 PM 10/14/2016    1:03 PM  Advanced Directives  Does Patient Have a Medical Advance Directive? Yes No No No No No No  Type of Estate agent of [place];Living will        Would patient like information on creating a medical advance directive?  No - Patient declined No - Patient declined No - Patient declined Yes (MAU/Ambulatory/Procedural Areas - Information given) No - Patient declined No - Patient declined    Current Medications (verified) Outpatient Encounter Medications as of 01/03/2023  Medication Sig   aspirin EC 81 MG tablet Take 1 tablet (81 mg total) by mouth daily.   atorvastatin (LIPITOR) 10 MG tablet TAKE 1 TABLET BY MOUTH EVERY DAY   calcium citrate-vitamin D (CITRACAL+D) 315-200 MG-UNIT per tablet Take 1 tablet by mouth 2 (two) times daily.    ibandronate (BONIVA) 150 MG tablet Take 1 tablet (150 mg total) by mouth every 30 (thirty) days. Take in the morning with a full glass of water, on an empty stomach, and do not take anything else by mouth or lie down for the next 30 min.   meloxicam (MOBIC) 15 MG tablet TAKE 1 TABLET (15 MG TOTAL) BY MOUTH DAILY.   metoprolol succinate (TOPROL-XL) 25 MG 24 hr tablet TAKE 1/2 TABLET BY MOUTH DAILY   nitroGLYCERIN (NITROSTAT) 0.4 MG SL tablet Place 1 tablet (0.4 mg total) under the tongue every 5 (five) minutes as needed for chest pain.   sertraline (ZOLOFT) 100 MG tablet TAKE 1 TABLET BY MOUTH EVERY DAY   traZODone (DESYREL) 50 MG tablet TAKE 0.5-1 TABLETS BY MOUTH AT BEDTIME AS NEEDED FOR SLEEP.   No facility-administered encounter medications on file as of 01/03/2023.    Allergies (verified) Betadine [povidone iodine] and Sulfonamide derivatives   History: Past Medical History:  Diagnosis Date   Anxiety state, unspecified    Basal cell carcinoma (BCC) of face    Breast cancer, right breast (HCC) 10/20/2004   S/P lumpectomy (04/2005) and chemo (03/2005)   Coronary artery disease 02/2019   60-70% prox-mid LCx stenosi (FFR 0.76) - medical management   Depression    Dizziness and giddiness    Dysrhythmia    Family history of breast cancer    Personal history of chemotherapy 03/2005   Personal  history of radiation therapy 05/2005   Past Surgical History:  Procedure Laterality Date   ABDOMINAL HYSTERECTOMY     "left my ovaries; no cancer"   BASAL CELL CARCINOMA EXCISION Right    side of face    BREAST BIOPSY Bilateral    "2 on the left; 1 on the right; all benign"   BREAST BIOPSY Right 10/20/2004   malignant   BREAST EXCISIONAL BIOPSY Left    BREAST LUMPECTOMY Right 04/2005   COLONOSCOPY     LEFT HEART CATH AND CORONARY ANGIOGRAPHY N/A 02/26/2019   Procedure: LEFT HEART CATH AND CORONARY ANGIOGRAPHY;  Surgeon: Lyn Records, MD;  Location: [facility];  Service:  Cardiovascular;  Laterality: N/A;   MASTECTOMY COMPLETE / SIMPLE Right 10/17/2016   MASTECTOMY W/ SENTINEL NODE BIOPSY Right 10/17/2016   Procedure: RIGHT MASTECTOMY WITH SENTINEL LYMPH NODE BIOPSY;  Surgeon: Claud Kelp, MD;  Location: [facility] OR;  Service: General;  Laterality: Right;   TONSILLECTOMY  Childhood   Family History  Problem Relation Age of Onset   Heart disease Mother    Hypertension Mother    Hyperlipidemia Mother    Goiter Mother    Cancer Father 60       lung, smoker   Breast cancer Maternal Aunt 45   Breast cancer Paternal Aunt 38   Breast cancer Maternal Aunt 40   Breast cancer Cousin 6       paternal first cousin   Social History   Socioeconomic History   Marital status: Married    Spouse name: Not on file   Number of children: Not on file   Years of education: Not on file   Highest education level: Not on file  Occupational History   Not on file  Tobacco Use   Smoking status: Never   Smokeless tobacco: Never  Vaping Use   Vaping status: Never Used  Substance and Sexual Activity   Alcohol use: No   Drug use: No   Sexual activity: Not on file  Other Topics Concern   Not on file  Social History Narrative   MArried, husband [name]    2 kids age 59 and 67   Social Determinants of Health   Financial Resource Strain: Low Risk  (01/03/2023)   Overall Financial Resource Strain (CARDIA)    Difficulty of Paying Living Expenses: Not hard at all  Food Insecurity: No Food Insecurity (01/03/2023)   Hunger Vital Sign    Worried About Running Out of Food in the Last Year: Never true    Ran Out of Food in the Last Year: Never true  Transportation Needs: No Transportation Needs (01/03/2023)   PRAPARE - Administrator, Civil Service (Medical): No    Lack of Transportation (Non-Medical): No  Physical Activity: Insufficiently Active (01/03/2023)   Exercise Vital Sign    Days of Exercise per Week: 3 days    Minutes of Exercise per Session: 30 min   Stress: No Stress Concern Present (01/03/2023)   [employer] of Occupational Health - Occupational Stress Questionnaire    Feeling of Stress : Only a little  Social Connections: Socially Integrated (01/03/2023)   Social Connection and Isolation Panel [NHANES]    Frequency of Communication with Friends and Family: More than three times a week    Frequency of Social Gatherings with Friends and Family: More than three times a week    Attends Religious Services: More than 4 times per year    Active  Member of Clubs or Organizations: Yes    Attends Engineer, structural: More than 4 times per year    Marital Status: Married    Tobacco Counseling Counseling given: Not Answered   Clinical Intake:  Pre-visit preparation completed: Yes  Pain : No/denies pain     BMI - recorded: 27.79 Nutritional Status: BMI 25 -29 Overweight Nutritional Risks: None Diabetes: No  How often do you need to have someone help you when you read instructions, pamphlets, or other written materials from your doctor or pharmacy?: 1 - Never  Interpreter Needed?: No  Comments: lives with husband Information entered by :: B.Joyann Spidle,LPN   Activities of Daily Living    01/03/2023    1:17 PM  In your present state of health, do you have any difficulty performing the following activities:  Hearing? 1  Vision? 0  Difficulty concentrating or making decisions? 1  Comment i cannot find my words and notice changes in my memory  Walking or climbing stairs? 0  Dressing or bathing? 0  Doing errands, shopping? 0  Preparing Food and eating ? N  Using the Toilet? N  In the past six months, have you accidently leaked urine? Y  Do you have problems with loss of bowel control? N  Managing your Medications? N  Managing your Finances? N  Housekeeping or managing your Housekeeping? N    Patient Care Team: Excell Seltzer, MD as PCP - General End, Cristal Deer, MD as PCP - Cardiology  (Cardiology) Magrinat, Valentino Hue, MD (Inactive) as Consulting Physician (Oncology) Glenna Fellows, MD as Consulting Physician (Plastic Surgery) Claud Kelp, MD as Consulting Physician (General Surgery)  Indicate any recent Medical Services you may have received from other than [facility] providers in the past year (date may be approximate).     Assessment:   This is a routine wellness examination for Lisa Salinas.  Hearing/Vision screen Hearing Screening - Comments:: Pt says she hears good except when ringing in ears (mostly left) Vision Screening - Comments:: Pt sts her vision is fine;does not have eye md right now (retired) [facility] last appt 3 yrs ago   Goals Addressed               This Visit's Progress     Patient stated (pt-stated)   On track     I'm trying to stop collecting stuff.       Depression Screen    01/03/2023    1:13 PM 11/23/2022    9:09 AM 09/22/2022    8:47 AM 06/02/2022   10:58 AM 09/21/2021    9:33 AM 02/11/2021   11:18 AM 02/01/2019    3:10 PM  PHQ 2/9 Scores  PHQ - 2 Score 0 0 1 0 2 0 0  PHQ- 9 Score  4 3 3 2 2 3     Fall Risk    01/03/2023    1:08 PM 11/23/2022    9:09 AM 09/22/2022    8:44 AM 09/21/2021    9:41 AM 03/17/2020    3:20 PM  Fall Risk   Falls in the past year? 1 1 1 1  0  Number falls in past yr: 0 0 0 0 0  Injury with Fall? 0 1 0 0 0  Comment    No injury or medical attention needed   Risk for fall due to : No Fall Risks No Fall Risks No Fall Risks No Fall Risks   Follow up Education provided;Falls prevention discussed Falls evaluation completed Falls  evaluation completed      MEDICARE RISK AT HOME: Medicare Risk at Home Any stairs in or around the home?: Yes If so, are there any without handrails?: No Home free of loose throw rugs in walkways, pet beds, electrical cords, etc?: Yes Adequate lighting in your home to reduce risk of falls?: Yes Life alert?: No Use of a cane, walker or w/c?: No Grab bars in the bathroom?: Yes Shower  chair or bench in shower?: No Elevated toilet seat or a handicapped toilet?: Yes  TIMED UP AND GO:  Was the test performed?  No    Cognitive Function:        01/03/2023    1:20 PM 09/21/2021    9:44 AM  6CIT Screen  What Year? 0 points 0 points  What month? 0 points 0 points  What time? 0 points 0 points  Count back from 20 0 points 0 points  Months in reverse 0 points 0 points  Repeat phrase 0 points 0 points  Total Score 0 points 0 points    Immunizations Immunization History  Administered Date(s) Administered   Fluad Quad(high Dose 65+) 03/17/2020   Influenza, High Dose Seasonal PF 03/29/2021   Influenza,inj,Quad PF,6+ Mos 02/02/2017, 04/02/2018, 02/28/2019   Influenza-Unspecified 01/17/2015   PFIZER(Purple Top)SARS-COV-2 Vaccination 06/27/2019, 07/23/2019, 04/24/2020   Pneumococcal Conjugate-13 03/17/2020   Pneumococcal Polysaccharide-23 11/23/2021   Tdap 08/19/2014   Zoster Recombinant(Shingrix) 02/01/2019, 04/04/2019    TDAP status: Up to date  Flu Vaccine status: Due, Education has been provided regarding the importance of this vaccine. Advised may receive this vaccine at local pharmacy or Health Dept. Aware to provide a copy of the vaccination record if obtained from local pharmacy or Health Dept. Verbalized acceptance and understanding.  Pneumococcal vaccine status: Up to date  Covid-19 vaccine status: Completed vaccines  Qualifies for Shingles Vaccine? Yes   Zostavax completed Yes   Shingrix Completed?: Yes  Screening Tests Health Maintenance  Topic Date Due   COVID-19 Vaccine (4 - 2023-24 season) 01/19/2023 (Originally 12/18/2022)   INFLUENZA VACCINE  07/17/2023 (Originally 11/17/2022)   MAMMOGRAM  05/07/2023   Medicare Annual Wellness (AWV)  01/03/2024   DTaP/Tdap/Td (2 - Td or Tdap) 08/18/2024   Colonoscopy  02/08/2027   Pneumonia Vaccine 14+ Years old  Completed   DEXA SCAN  Completed   Hepatitis C Screening  Completed   Zoster Vaccines- Shingrix   Completed   HPV VACCINES  Aged Out    Health Maintenance  There are no preventive care reminders to display for this patient.   Colorectal cancer screening: Type of screening: Colonoscopy. Completed yes. Repeat every 5-10 years  Mammogram status: Completed yes. Repeat every year  Bone Density status: Completed yes. Results reflect: Bone density results: OSTEOPOROSIS. Repeat every 3 years.  Lung Cancer Screening: (Low Dose CT Chest recommended if Age 85-80 years, 20 pack-year currently smoking OR have quit w/in 15years.) does not qualify.   Lung Cancer Screening Referral: no  Additional Screening:  Hepatitis C Screening: does not qualify; Completed yes  Vision Screening: Recommended annual ophthalmology exams for early detection of glaucoma and other disorders of the eye. Is the patient up to date with their annual eye exam?  No  Who is the provider or what is the name of the office in which the patient attends annual eye exams? None at this time If pt is not established with a provider, would they like to be referred to a provider to establish care? No .  Dental Screening: Recommended annual dental exams for proper oral hygiene  Diabetic Foot Exam: n/a  Community Resource Referral / Chronic Care Management: CRR required this visit?  No   CCM required this visit?  No    Plan:     I have personally reviewed and noted the following in the patient's chart:   Medical and social history Use of alcohol, tobacco or illicit drugs  Current medications and supplements including opioid prescriptions. Patient is not currently taking opioid prescriptions. Functional ability and status Nutritional status Physical activity Advanced directives List of other physicians Hospitalizations, surgeries, and ER visits in previous 12 months Vitals Screenings to include cognitive, depression, and falls Referrals and appointments  In addition, I have reviewed and discussed with patient  certain preventive protocols, quality metrics, and best practice recommendations. A written personalized care plan for preventive services as well as general preventive health recommendations were provided to patient.    Sue Lush, LPN   1/61/0960   After Visit Summary: (MyChart) Due to this being a telephonic visit, the after visit summary with patients personalized plan was offered to patient via MyChart   Nurse Notes: The patient states she is doing well and has no concerns or questions at this time.

## 2023-01-03 NOTE — Patient Instructions (Signed)
Lisa Salinas , Thank you for taking time to come for your Medicare Wellness Visit. I appreciate your ongoing commitment to your health goals. Please review the following plan we discussed and let me know if I can assist you in the future.   Referrals/Orders/Follow-Ups/Clinician Recommendations: none  This is a list of the screening recommended for you and due dates:  Health Maintenance  Topic Date Due   COVID-19 Vaccine (4 - 2023-24 season) 12/18/2022   Flu Shot  07/17/2023*   Mammogram  05/07/2023   Medicare Annual Wellness Visit  01/03/2024   DTaP/Tdap/Td vaccine (2 - Td or Tdap) 08/18/2024   Colon Cancer Screening  02/08/2027   Pneumonia Vaccine  Completed   DEXA scan (bone density measurement)  Completed   Hepatitis C Screening  Completed   Zoster (Shingles) Vaccine  Completed   HPV Vaccine  Aged Out  *Topic was postponed. The date shown is not the original due date.    Advanced directives: (Copy Requested) Please bring a copy of your health care power of attorney and living will to the office to be added to your chart at your convenience.  Next Medicare Annual Wellness Visit scheduled for next year: Yes 01/10/2024 @ 2pm telephone

## 2023-01-18 ENCOUNTER — Ambulatory Visit (INDEPENDENT_AMBULATORY_CARE_PROVIDER_SITE_OTHER): Payer: Medicare PPO | Admitting: Family Medicine

## 2023-01-18 ENCOUNTER — Encounter: Payer: Self-pay | Admitting: Family Medicine

## 2023-01-18 VITALS — BP 122/80 | HR 78 | Temp 98.2°F | Ht 65.0 in | Wt 165.0 lb

## 2023-01-18 DIAGNOSIS — M818 Other osteoporosis without current pathological fracture: Secondary | ICD-10-CM | POA: Diagnosis not present

## 2023-01-18 DIAGNOSIS — E785 Hyperlipidemia, unspecified: Secondary | ICD-10-CM | POA: Diagnosis not present

## 2023-01-18 DIAGNOSIS — F331 Major depressive disorder, recurrent, moderate: Secondary | ICD-10-CM | POA: Diagnosis not present

## 2023-01-18 DIAGNOSIS — Z0000 Encounter for general adult medical examination without abnormal findings: Secondary | ICD-10-CM | POA: Diagnosis not present

## 2023-01-18 DIAGNOSIS — Z853 Personal history of malignant neoplasm of breast: Secondary | ICD-10-CM

## 2023-01-18 DIAGNOSIS — Z23 Encounter for immunization: Secondary | ICD-10-CM

## 2023-01-18 DIAGNOSIS — E042 Nontoxic multinodular goiter: Secondary | ICD-10-CM | POA: Diagnosis not present

## 2023-01-18 NOTE — Assessment & Plan Note (Signed)
Well-controlled on sertraline 100 mg daily and using trazodone 25 to 50 mg at bedtime for insomnia

## 2023-01-18 NOTE — Progress Notes (Signed)
Patient ID: Lisa Salinas, female    DOB: 1955-12-20, 67 y.o.   MRN: [id number]  This visit was conducted in person.  BP 122/80 (BP Location: Left Arm, Patient Position: Sitting, Cuff Size: Normal)   Pulse 78   Temp 98.2 F (36.8 C) (Oral)   Ht 5\' 5"  (1.651 m)   Wt 165 lb (74.8 kg)   SpO2 94%   BMI 27.46 kg/m    CC:  Chief Complaint  Patient presents with   Medicare Wellness    Part 2    Subjective:   HPI: Lisa Salinas is a 67 y.o. female presenting on 01/18/2023 for Medicare Wellness (Part 2)  The patient presents for  complete physical and review of chronic health problems. He/She also has the following acute concerns today:  none    The patient saw a LPN or RN for medicare wellness visit. 01/03/2023  Prevention and wellness was reviewed in detail. Note reviewed and important notes copied below.  MDD, recurrent : Well-controlled on sertraline 100 mg daily and using trazodone 25 to 50 mg at bedtime for insomnia Flowsheet Row Office Visit from 01/18/2023 in [facility] Total Score 0      History of breast cancer, recurrent: Has completed tamoxifen course.  Elevated Cholesterol:  Due for re-check, previously  LDL at goal on atorvastatin 10 mg p.o. daily.  LDL goal less than 70 given coronary artery disease. Lab Results  Component Value Date   CHOL 128 04/29/2021   HDL 56 04/29/2021   LDLCALC 58 04/29/2021   TRIG 69 04/29/2021   CHOLHDL 2.3 04/29/2021  Using medications without problems: Muscle aches:  Diet compliance: moderate Exercise: None Other complaints: Wt Readings from Last 3 Encounters:  01/18/23 165 lb (74.8 kg)  01/03/23 167 lb (75.8 kg)  11/23/22 167 lb (75.8 kg)     Relevant past medical, surgical, family and social history reviewed and updated as indicated. Interim medical history since our last visit reviewed. Allergies and medications reviewed and updated. Outpatient Medications Prior to Visit   Medication Sig Dispense Refill   aspirin EC 81 MG tablet Take 1 tablet (81 mg total) by mouth daily. 90 tablet 3   atorvastatin (LIPITOR) 10 MG tablet TAKE 1 TABLET BY MOUTH EVERY DAY 90 tablet 2   calcium citrate-vitamin D (CITRACAL+D) 315-200 MG-UNIT per tablet Take 1 tablet by mouth 2 (two) times daily.     ibandronate (BONIVA) 150 MG tablet Take 1 tablet (150 mg total) by mouth every 30 (thirty) days. Take in the morning with a full glass of water, on an empty stomach, and do not take anything else by mouth or lie down for the next 30 min. 3 tablet 3   meloxicam (MOBIC) 15 MG tablet TAKE 1 TABLET (15 MG TOTAL) BY MOUTH DAILY. 30 tablet 0   metoprolol succinate (TOPROL-XL) 25 MG 24 hr tablet TAKE 1/2 TABLET BY MOUTH DAILY 45 tablet 2   nitroGLYCERIN (NITROSTAT) 0.4 MG SL tablet Place 1 tablet (0.4 mg total) under the tongue every 5 (five) minutes as needed for chest pain. 25 tablet 3   sertraline (ZOLOFT) 100 MG tablet TAKE 1 TABLET BY MOUTH EVERY DAY 90 tablet 0   traZODone (DESYREL) 50 MG tablet TAKE 0.5-1 TABLETS BY MOUTH AT BEDTIME AS NEEDED FOR SLEEP. 90 tablet 0   No facility-administered medications prior to visit.     Per HPI unless specifically indicated in ROS section below  Review of Systems  Constitutional:  Negative for fatigue and fever.  HENT:  Negative for congestion.   Eyes:  Negative for pain.  Respiratory:  Negative for cough and shortness of breath.   Cardiovascular:  Negative for chest pain, palpitations and leg swelling.  Gastrointestinal:  Negative for abdominal pain.  Genitourinary:  Negative for dysuria and vaginal bleeding.  Musculoskeletal:  Negative for back pain.  Neurological:  Negative for syncope, light-headedness and headaches.  Psychiatric/Behavioral:  Negative for dysphoric mood.    Objective:  BP 122/80 (BP Location: Left Arm, Patient Position: Sitting, Cuff Size: Normal)   Pulse 78   Temp 98.2 F (36.8 C) (Oral)   Ht 5\' 5"  (1.651 m)   Wt 165  lb (74.8 kg)   SpO2 94%   BMI 27.46 kg/m   Wt Readings from Last 3 Encounters:  01/18/23 165 lb (74.8 kg)  01/03/23 167 lb (75.8 kg)  11/23/22 167 lb (75.8 kg)      Physical Exam Vitals and nursing note reviewed.  Constitutional:      General: She is not in acute distress.    Appearance: Normal appearance. She is well-developed. She is not ill-appearing or toxic-appearing.  HENT:     Head: Normocephalic.     Right Ear: Hearing, tympanic membrane, ear canal and external ear normal.     Left Ear: Hearing, tympanic membrane, ear canal and external ear normal.     Nose: Nose normal.  Eyes:     General: Lids are normal. Lids are everted, no foreign bodies appreciated.     Conjunctiva/sclera: Conjunctivae normal.     Pupils: Pupils are equal, round, and reactive to light.  Neck:     Thyroid: No thyroid mass or thyromegaly.     Vascular: No carotid bruit.     Trachea: Trachea normal.  Cardiovascular:     Rate and Rhythm: Normal rate and regular rhythm.     Heart sounds: Normal heart sounds, S1 normal and S2 normal. No murmur heard.    No gallop.  Pulmonary:     Effort: Pulmonary effort is normal. No respiratory distress.     Breath sounds: Normal breath sounds. No wheezing, rhonchi or rales.  Abdominal:     General: Bowel sounds are normal. There is no distension or abdominal bruit.     Palpations: Abdomen is soft. There is no fluid wave or mass.     Tenderness: There is no abdominal tenderness. There is no guarding or rebound.     Hernia: No hernia is present.  Musculoskeletal:     Cervical back: Normal range of motion and neck supple.  Lymphadenopathy:     Cervical: No cervical adenopathy.  Skin:    General: Skin is warm and dry.     Findings: No rash.  Neurological:     Mental Status: She is alert.     Cranial Nerves: No cranial nerve deficit.     Sensory: No sensory deficit.  Psychiatric:        Mood and Affect: Mood is not anxious or depressed.        Speech: Speech  normal.        Behavior: Behavior normal. Behavior is cooperative.        Judgment: Judgment normal.       Results for orders placed or performed in visit on 11/23/21  CBC with Differential/Platelet  Result Value Ref Range   WBC 5.2 4.0 - 10.5 K/uL   RBC 4.61 3.87 - 5.11  Mil/uL   Hemoglobin 13.5 12.0 - 15.0 g/dL   HCT 69.6 29.5 - 28.4 %   MCV 89.2 78.0 - 100.0 fl   MCHC 33.0 30.0 - 36.0 g/dL   RDW 13.2 44.0 - 10.2 %   Platelets 183.0 150.0 - 400.0 K/uL   Neutrophils Relative % 48.9 43.0 - 77.0 %   Lymphocytes Relative 39.4 12.0 - 46.0 %   Monocytes Relative 8.4 3.0 - 12.0 %   Eosinophils Relative 2.9 0.0 - 5.0 %   Basophils Relative 0.4 0.0 - 3.0 %   Neutro Abs 2.6 1.4 - 7.7 K/uL   Lymphs Abs 2.1 0.7 - 4.0 K/uL   Monocytes Absolute 0.4 0.1 - 1.0 K/uL   Eosinophils Absolute 0.1 0.0 - 0.7 K/uL   Basophils Absolute 0.0 0.0 - 0.1 K/uL  VITAMIN D 25 Hydroxy (Vit-D Deficiency, Fractures)  Result Value Ref Range   VITD 30.22 30.00 - 100.00 ng/mL  TSH  Result Value Ref Range   TSH 0.76 0.35 - 5.50 uIU/mL  T4, free  Result Value Ref Range   Free T4 0.82 0.60 - 1.60 ng/dL  T3, free  Result Value Ref Range   T3, Free 2.8 2.3 - 4.2 pg/mL  Vitamin B12  Result Value Ref Range   Vitamin B-12 238 211 - 911 pg/mL     COVID 19 screen:  No recent travel or known exposure to COVID19 The patient denies respiratory symptoms of COVID 19 at this time. The importance of social distancing was discussed today.   Assessment and Plan   The patient's preventative maintenance and recommended screening tests for an annual wellness exam were reviewed in full today. Brought up to date unless services declined.  Counselled on the importance of diet, exercise, and its role in overall health and mortality. The patient's FH and SH was reviewed, including their home life, tobacco status, and drug and alcohol status.   Had partial hysterectomy .Marland Kitchenno further paps needed. Mammogram: stable 04/2022..  breast cancer history rec yearly Colon:  01/2017 nml , repeat in 10 years. Vaccines: uptodate,  Has  had 3 COVID vaccines. Flu  and TDap uptodate,  She is S/P shingrix series. HIV: refused DEXA: 10/2021 improved  osteoporosis. Was On tamoxifen for 2  year course.  Now off.  Was previously treated with Boniva x 2 years prior to 2019, no SE....  new plan: restart BONVIA and recheck in 2 years ( 2025)   Problem List Items Addressed This Visit     History of breast cancer   Hyperlipidemia LDL goal <70     Due for re-eval  In past LDL at goal on atorvastatin 10 mg p.o. daily.  LDL goal less than 70 given coronary artery disease.      Relevant Orders   Lipid panel   Comprehensive metabolic panel   Major depression, recurrent (HCC) (Chronic)    Well-controlled on sertraline 100 mg daily and using trazodone 25 to 50 mg at bedtime for insomnia      Multiple thyroid nodules (Chronic)   Relevant Orders   TSH   T4, free   T3, free   Osteoporosis    Restarted Boniva, now off tamoxifem.  Recheck in 2025.      Other Visit Diagnoses     Routine general medical examination at a health care facility    -  Primary   Encounter for immunization       Relevant Orders   Flu Vaccine Trivalent High Dose (Fluad) (  Completed)      No orders of the defined types were placed in this encounter.  Orders Placed This Encounter  Procedures   Flu Vaccine Trivalent High Dose (Fluad)   Lipid panel    Standing Status:   Future    Standing Expiration Date:   04/18/2023   Comprehensive metabolic panel    Standing Status:   Future    Standing Expiration Date:   04/18/2023   TSH    Standing Status:   Future    Standing Expiration Date:   01/18/2024   T4, free    Standing Status:   Future    Standing Expiration Date:   01/18/2024   T3, free    Standing Status:   Future    Standing Expiration Date:   01/18/2024    Kerby Nora, MD

## 2023-01-18 NOTE — Assessment & Plan Note (Signed)
Restarted Boniva, now off tamoxifem.  Recheck in 2025.

## 2023-01-18 NOTE — Assessment & Plan Note (Addendum)
Due for re-eval  In past LDL at goal on atorvastatin 10 mg p.o. daily.  LDL goal less than 70 given coronary artery disease.

## 2023-01-19 ENCOUNTER — Other Ambulatory Visit (INDEPENDENT_AMBULATORY_CARE_PROVIDER_SITE_OTHER): Payer: Medicare PPO

## 2023-01-19 DIAGNOSIS — E042 Nontoxic multinodular goiter: Secondary | ICD-10-CM

## 2023-01-19 DIAGNOSIS — E785 Hyperlipidemia, unspecified: Secondary | ICD-10-CM | POA: Diagnosis not present

## 2023-01-19 LAB — COMPREHENSIVE METABOLIC PANEL
ALT: 11 U/L (ref 0–35)
AST: 16 U/L (ref 0–37)
Albumin: 4.1 g/dL (ref 3.5–5.2)
Alkaline Phosphatase: 48 U/L (ref 39–117)
BUN: 18 mg/dL (ref 6–23)
CO2: 31 meq/L (ref 19–32)
Calcium: 9.3 mg/dL (ref 8.4–10.5)
Chloride: 104 meq/L (ref 96–112)
Creatinine, Ser: 0.64 mg/dL (ref 0.40–1.20)
GFR: 91.05 mL/min (ref >60.00)
Glucose, Bld: 83 mg/dL (ref 70–99)
Potassium: 4.3 meq/L (ref 3.5–5.1)
Sodium: 141 meq/L (ref 135–145)
Total Bilirubin: 0.8 mg/dL (ref 0.2–1.2)
Total Protein: 6.5 g/dL (ref 6.0–8.3)

## 2023-01-19 LAB — LIPID PANEL
Cholesterol: 113 mg/dL (ref 0–200)
HDL: 54.5 mg/dL (ref >39.00)
LDL Cholesterol: 48 mg/dL (ref 0–99)
NonHDL: 58.48
Total CHOL/HDL Ratio: 2
Triglycerides: 51 mg/dL (ref 0.0–149.0)
VLDL: 10.2 mg/dL (ref 0.0–40.0)

## 2023-01-19 LAB — TSH: TSH: 0.98 u[IU]/mL (ref 0.35–5.50)

## 2023-01-19 LAB — T4, FREE: Free T4: 0.84 ng/dL (ref 0.60–1.60)

## 2023-01-19 LAB — T3, FREE: T3, Free: 3.1 pg/mL (ref 2.3–4.2)

## 2023-01-20 ENCOUNTER — Encounter: Payer: Medicare PPO | Admitting: Family Medicine

## 2023-03-24 ENCOUNTER — Other Ambulatory Visit: Payer: Self-pay | Admitting: Family Medicine

## 2023-05-18 ENCOUNTER — Other Ambulatory Visit: Payer: Self-pay | Admitting: Internal Medicine

## 2023-05-18 NOTE — Telephone Encounter (Signed)
Please contact pt for future appointment. Pt due for follow up.

## 2023-05-25 ENCOUNTER — Ambulatory Visit: Payer: Medicare PPO | Attending: Medical | Admitting: Medical

## 2023-05-25 ENCOUNTER — Encounter: Payer: Self-pay | Admitting: Medical

## 2023-05-25 VITALS — BP 100/60 | HR 76 | Ht 64.0 in | Wt 173.8 lb

## 2023-05-25 DIAGNOSIS — R002 Palpitations: Secondary | ICD-10-CM

## 2023-05-25 DIAGNOSIS — I2510 Atherosclerotic heart disease of native coronary artery without angina pectoris: Secondary | ICD-10-CM

## 2023-05-25 NOTE — Progress Notes (Signed)
 Cardiology Office Note:  .   Date:  05/25/2023  ID:  Lisa Salinas, DOB December 10, 1955, MRN [id number] PCP: Avelina Greig BRAVO, MD  [facility] Providers Cardiologist:  Lonni Hanson, MD {  History of Present Illness: .   Lisa Salinas is a 68 y.o. female with a h/o CAD with 60-70% p-m Lcx stenosis significant by FFR (0.76 in 02/2019) being managed medically, NSVT, recurrent breast cancer s/p lumpectomy and chemoradiation, and depression and anxiety who presents for 12 month follow-up.    Cardiac cath in 02/2019 circumflex segment 60-70% proximal to mid stenosis, FFR CT 0.76 beyond the stenosis, normal left main, 30% mLAD, 30% p-m RCA, LVEF 65%. Echo showed LVEF 55-60%, mild LVH, trace MR.   Patient was last seen 05/2022.  She reported chest tightness associated with stress.  NO ischemic work-up was pursued at that time.  Today, the patient reports occasional palpitations that improve with resting. She has bilateral hip pain. She took Aleve with improvement of symptoms, suspect arthritis-recommended PCP follow-up. She denies chest pain or shortness of breath. No lower leg edema. She has occasional lightheadedness and dizziness when she changes positions. She has episodes sof vertigo when she has sinus issues.   Studies Reviewed: .       Heart monitor 03/2019 1. The basic rhythm is normal sinus with an average HR of 75 bpm 2. 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 Occasional atrial runs 4. No bradycardic events or pathologic pauses > 3 seconds 5. No atrial fibrillation or flutter 6. No sustained arrhythmia  Cardac cath 02/2019 Circumflex segmental 60 to 70% proximal to mid stenosis.  FFR CT 0.76 beyond the stenosis.  Symptoms are atypical Normal left main 30% mid LAD 30% proximal to mid RCA Normal LV function and LVEDP.  Ejection fraction 65%.   Recommendations:   Aggressive preventive  therapy: High intensity statin therapy with LDL target 55 or less.  Consider adding angiotensin blockade. Phase 2 cardiac rehab. Close clinical follow-up. If she develops exertional symptoms, would consider circumflex PCI but hopefully will with lifestyle changes and medical therapy. Recommendations   Antiplatelet/Anticoag Recommend Aspirin  81mg  daily for moderate CAD.    Echo 01/2019 1. Left ventricular ejection fraction, by visual estimation, is 55 to  60%. The left ventricle has normal function. Normal left ventricular size.  Left ventricular septal wall thickness was mildly increased. Mildly  increased left ventricular posterior  wall thickness. There is mildly increased left ventricular hypertrophy.   2. Left ventricular diastolic Doppler parameters are indeterminate  pattern of LV diastolic filling.   3. Global right ventricle has normal systolic function.The right  ventricular size is normal. No increase in right ventricular wall  thickness.   4. Left atrial size was normal.   5. Right atrial size was normal.   6. The mitral valve is normal in structure. Trace mitral valve  regurgitation.   7. The tricuspid valve is normal in structure. Tricuspid valve  regurgitation is trivial.   8. The aortic valve is tricuspid Aortic valve regurgitation was not  visualized by color flow Doppler. Structurally normal aortic valve, with  no evidence of sclerosis or stenosis.   9. The pulmonic valve was grossly normal. Pulmonic valve regurgitation is  not visualized by color flow Doppler.  10. Normal pulmonary artery systolic pressure.  11. The inferior vena cava is normal in size with greater than 50%  respiratory variability, suggesting right atrial pressure of 3 mmHg.      Physical Exam:   VS:  BP 100/60 (BP Location: Left Arm, Patient Position: Sitting, Cuff Size: Normal)   Pulse 76   Ht 5' 4 (1.626 m)   Wt 173 lb 12.8 oz (78.8 kg)   SpO2 96%   BMI 29.83 kg/m    Wt Readings from  Last 3 Encounters:  05/25/23 173 lb 12.8 oz (78.8 kg)  01/18/23 165 lb (74.8 kg)  01/03/23 167 lb (75.8 kg)    GEN: Well nourished, well developed in no acute distress NECK: No JVD; No carotid bruits CARDIAC: RRR, no murmurs, rubs, gallops RESPIRATORY:  Clear to auscultation without rales, wheezing or rhonchi  ABDOMEN: Soft, non-tender, non-distended EXTREMITIES:  No edema; No deformity   ASSESSMENT AND PLAN: .    CAD Cath in 2020 showed 60-70% p-m Lcx stenosis significant by FFR being managed medically. Patient denies anginal symptoms. Continue medical management with ASA 81mg  daily, Lipitor 10mg  daily, Toprol  12.5mg  daily and SL NTG.  Palpitations Patient has occasional palpitations related to stress or anxiety, improved with rest. She is taking Toprol  12.5mg  daily, cannot increase due to soft BP. Does not feel heart monitor is  necessary at this time.   Hyperlipidemia LDL 48. Continue Lipitor 10mg  daily.     Dispo: Follow-up in 1 year  Signed, Javion Holmer VEAR Fishman, PA-C

## 2023-05-25 NOTE — Patient Instructions (Signed)
 Medication Instructions:  Your physician recommends that you continue on your current medications as directed. Please refer to the Current Medication list given to you today.   *If you need a refill on your cardiac medications before your next appointment, please call your pharmacy*   Lab Work: No labs ordered today    Testing/Procedures: No test ordered today    Follow-Up: At [facility], you and your health needs are our priority.  As part of our continuing mission to provide you with exceptional heart care, we have created designated Provider Care Teams.  These Care Teams include your primary Cardiologist (physician) and Advanced Practice Providers (APPs -  Physician Assistants and Nurse Practitioners) who all work together to provide you with the care you need, when you need it.  We recommend signing up for the patient portal called "MyChart".  Sign up information is provided on this After Visit Summary.  MyChart is used to connect with patients for Virtual Visits (Telemedicine).  Patients are able to view lab/test results, encounter notes, upcoming appointments, etc.  Non-urgent messages can be sent to your provider as well.   To learn more about what you can do with MyChart, go to [email or web address].    Your next appointment:   1 year(s)  Provider:   You may see Yvonne Kendall, MD or one of the following Advanced Practice Providers on your designated Care Team:   Nicolasa Ducking, NP Eula Listen, PA-C Cadence Fransico Michael, PA-C Charlsie Quest, NP Carlos Levering, NP

## 2023-07-05 ENCOUNTER — Encounter: Payer: Self-pay | Admitting: Family Medicine

## 2023-07-06 NOTE — Telephone Encounter (Signed)
 Left message for Denara that Dr. Kerby Nora would sign order and our fax number provided.  Right Side Mastectomy information also provided.

## 2023-07-06 NOTE — Telephone Encounter (Signed)
 Copied from CRM [id number]. Topic: General - Other >> Jul 06, 2023  1:46 PM Truddie Crumble wrote: Reason for CRM: Denara from [facility] wanted to verify patient doctor so she can send orders. What side is the mastectomy on because there are different codes to put in for insurance  CB [phone]

## 2023-07-06 NOTE — Telephone Encounter (Signed)
 Please contact patient to determine what she needs specifically... as prescription?

## 2023-07-11 ENCOUNTER — Other Ambulatory Visit: Payer: Self-pay | Admitting: Family Medicine

## 2023-07-11 DIAGNOSIS — Z1231 Encounter for screening mammogram for malignant neoplasm of breast: Secondary | ICD-10-CM

## 2023-07-17 NOTE — Telephone Encounter (Signed)
 Copied from CRM [id number]. Topic: General - Other >> Jul 06, 2023  1:46 PM Truddie Crumble wrote: Reason for CRM: Denara from [employer] wanted to verify patient doctor so she can send orders. What side is the mastectomy on because there are different codes to put in for insurance  CB [phone] >> Jul 17, 2023 11:23 AM Eunice Blase wrote: Called again from [place] from [facility] ph: [phone] fax: [phone] need signed and sent.

## 2023-07-17 NOTE — Telephone Encounter (Signed)
 Orders and Last office note faxed to [facility] at [phone].

## 2023-07-26 ENCOUNTER — Ambulatory Visit
Admission: RE | Admit: 2023-07-26 | Discharge: 2023-07-26 | Disposition: A | Discharge status: Home / Self Care | Source: Ambulatory Visit | Attending: Family Medicine | Admitting: Family Medicine

## 2023-07-26 DIAGNOSIS — Z1231 Encounter for screening mammogram for malignant neoplasm of breast: Secondary | ICD-10-CM | POA: Diagnosis not present

## 2023-07-31 ENCOUNTER — Other Ambulatory Visit: Payer: Self-pay | Admitting: Family Medicine

## 2023-07-31 DIAGNOSIS — R928 Other abnormal and inconclusive findings on diagnostic imaging of breast: Secondary | ICD-10-CM

## 2023-08-15 ENCOUNTER — Other Ambulatory Visit: Payer: Self-pay | Admitting: Family Medicine

## 2023-08-15 ENCOUNTER — Encounter

## 2023-08-15 ENCOUNTER — Ambulatory Visit
Admission: RE | Admit: 2023-08-15 | Discharge: 2023-08-15 | Disposition: A | Discharge status: Home / Self Care | Source: Ambulatory Visit | Attending: Family Medicine | Admitting: Family Medicine

## 2023-08-15 DIAGNOSIS — R921 Mammographic calcification found on diagnostic imaging of breast: Secondary | ICD-10-CM

## 2023-08-15 DIAGNOSIS — R922 Inconclusive mammogram: Secondary | ICD-10-CM | POA: Diagnosis not present

## 2023-08-15 DIAGNOSIS — R92332 Mammographic heterogeneous density, left breast: Secondary | ICD-10-CM | POA: Diagnosis not present

## 2023-08-15 DIAGNOSIS — R928 Other abnormal and inconclusive findings on diagnostic imaging of breast: Secondary | ICD-10-CM

## 2023-08-16 ENCOUNTER — Other Ambulatory Visit: Payer: Self-pay | Admitting: Internal Medicine

## 2023-08-18 ENCOUNTER — Ambulatory Visit
Admission: RE | Admit: 2023-08-18 | Discharge: 2023-08-18 | Disposition: A | Discharge status: Home / Self Care | Source: Ambulatory Visit | Attending: Family Medicine | Admitting: Family Medicine

## 2023-08-18 DIAGNOSIS — R921 Mammographic calcification found on diagnostic imaging of breast: Secondary | ICD-10-CM

## 2023-08-18 DIAGNOSIS — N6323 Unspecified lump in the left breast, lower outer quadrant: Secondary | ICD-10-CM | POA: Diagnosis not present

## 2023-08-18 DIAGNOSIS — N6321 Unspecified lump in the left breast, upper outer quadrant: Secondary | ICD-10-CM | POA: Diagnosis not present

## 2023-08-18 DIAGNOSIS — N6012 Diffuse cystic mastopathy of left breast: Secondary | ICD-10-CM | POA: Diagnosis not present

## 2023-08-18 DIAGNOSIS — D242 Benign neoplasm of left breast: Secondary | ICD-10-CM | POA: Diagnosis not present

## 2023-08-18 DIAGNOSIS — R920 Mammographic microcalcification found on diagnostic imaging of breast: Secondary | ICD-10-CM | POA: Diagnosis not present

## 2023-08-18 HISTORY — PX: BREAST BIOPSY: SHX20

## 2023-08-21 ENCOUNTER — Telehealth: Payer: Self-pay | Admitting: Family Medicine

## 2023-08-21 LAB — SURGICAL PATHOLOGY

## 2023-08-21 NOTE — Telephone Encounter (Signed)
 [facility] [employer] will not accept last OV note for mastectomy bra and supplies due to no mention of the history of mastectomy only history of breast cancer. Can you addend your last OV note to include patients history of Mastectomy?

## 2023-08-21 NOTE — Telephone Encounter (Signed)
 Copied from CRM [id number]. Topic: General - Other >> Aug 21, 2023 11:51 AM Chuck Crater wrote: Reason for CRM: [facility] is needing notes that indicates patient history on masectomy. She said they only have notes on her cancer diagnosis, she needs the history of it. Fax: [phone]

## 2023-08-22 NOTE — Assessment & Plan Note (Addendum)
 Recurrent breast cancer  S/P tamoxifen  course.  S/P  right mastectomy.. needs mastectomy bra and supplies.

## 2023-08-23 NOTE — Telephone Encounter (Signed)
 Updated office note faxed to [facility] at [phone].

## 2023-08-29 DIAGNOSIS — D242 Benign neoplasm of left breast: Secondary | ICD-10-CM | POA: Diagnosis not present

## 2023-09-14 DIAGNOSIS — I788 Other diseases of capillaries: Secondary | ICD-10-CM | POA: Diagnosis not present

## 2023-09-14 DIAGNOSIS — C44519 Basal cell carcinoma of skin of other part of trunk: Secondary | ICD-10-CM | POA: Diagnosis not present

## 2023-09-14 DIAGNOSIS — L821 Other seborrheic keratosis: Secondary | ICD-10-CM | POA: Diagnosis not present

## 2023-09-14 DIAGNOSIS — D2261 Melanocytic nevi of right upper limb, including shoulder: Secondary | ICD-10-CM | POA: Diagnosis not present

## 2023-09-14 DIAGNOSIS — L814 Other melanin hyperpigmentation: Secondary | ICD-10-CM | POA: Diagnosis not present

## 2023-09-14 DIAGNOSIS — D225 Melanocytic nevi of trunk: Secondary | ICD-10-CM | POA: Diagnosis not present

## 2023-09-14 DIAGNOSIS — L570 Actinic keratosis: Secondary | ICD-10-CM | POA: Diagnosis not present

## 2023-09-14 DIAGNOSIS — Z85828 Personal history of other malignant neoplasm of skin: Secondary | ICD-10-CM | POA: Diagnosis not present

## 2023-09-14 DIAGNOSIS — D485 Neoplasm of uncertain behavior of skin: Secondary | ICD-10-CM | POA: Diagnosis not present

## 2023-09-14 DIAGNOSIS — D2371 Other benign neoplasm of skin of right lower limb, including hip: Secondary | ICD-10-CM | POA: Diagnosis not present

## 2023-09-20 ENCOUNTER — Other Ambulatory Visit: Payer: Self-pay | Admitting: Family Medicine

## 2023-10-09 DIAGNOSIS — Z85828 Personal history of other malignant neoplasm of skin: Secondary | ICD-10-CM | POA: Diagnosis not present

## 2023-10-09 DIAGNOSIS — C4441 Basal cell carcinoma of skin of scalp and neck: Secondary | ICD-10-CM | POA: Diagnosis not present

## 2023-12-13 ENCOUNTER — Other Ambulatory Visit: Payer: Self-pay | Admitting: Family Medicine

## 2023-12-15 ENCOUNTER — Other Ambulatory Visit: Payer: Self-pay | Admitting: Family Medicine

## 2024-01-10 ENCOUNTER — Other Ambulatory Visit: Payer: Self-pay | Admitting: Family Medicine

## 2024-01-10 ENCOUNTER — Telehealth: Payer: Self-pay

## 2024-01-10 DIAGNOSIS — E785 Hyperlipidemia, unspecified: Secondary | ICD-10-CM

## 2024-01-10 DIAGNOSIS — E042 Nontoxic multinodular goiter: Secondary | ICD-10-CM

## 2024-01-10 NOTE — Telephone Encounter (Signed)
 Please schedule fasting lab appointment prior to her CPE on 02/06/24.  Future lab orders in Epic.

## 2024-01-10 NOTE — Telephone Encounter (Signed)
 Copied from CRM #[id number]. Topic: Clinical - Request for Lab/Test Order >> Jan 10, 2024  9:05 AM Lisa Salinas wrote: Reason for CRM: Patient called in regarding lab work for her annual physical that is scheduled for 10/21, patient stated she would like to have them a couple days before so she can discuss them at the appointment

## 2024-01-16 ENCOUNTER — Encounter: Payer: Self-pay | Admitting: General Surgery

## 2024-01-17 ENCOUNTER — Other Ambulatory Visit: Payer: Self-pay | Admitting: General Surgery

## 2024-01-17 DIAGNOSIS — D242 Benign neoplasm of left breast: Secondary | ICD-10-CM

## 2024-01-30 ENCOUNTER — Ambulatory Visit: Payer: Self-pay | Admitting: Family Medicine

## 2024-01-30 ENCOUNTER — Other Ambulatory Visit (INDEPENDENT_AMBULATORY_CARE_PROVIDER_SITE_OTHER)

## 2024-01-30 DIAGNOSIS — E785 Hyperlipidemia, unspecified: Secondary | ICD-10-CM

## 2024-01-30 DIAGNOSIS — E042 Nontoxic multinodular goiter: Secondary | ICD-10-CM | POA: Diagnosis not present

## 2024-01-30 LAB — TSH: TSH: 0.59 u[IU]/mL (ref 0.35–5.50)

## 2024-01-30 LAB — COMPREHENSIVE METABOLIC PANEL WITH GFR
ALT: 12 U/L (ref 0–35)
AST: 15 U/L (ref 0–37)
Albumin: 4.3 g/dL (ref 3.5–5.2)
Alkaline Phosphatase: 49 U/L (ref 39–117)
BUN: 19 mg/dL (ref 6–23)
CO2: 31 meq/L (ref 19–32)
Calcium: 8.8 mg/dL (ref 8.4–10.5)
Chloride: 104 meq/L (ref 96–112)
Creatinine, Ser: 0.68 mg/dL (ref 0.40–1.20)
GFR: 89.09 mL/min (ref >60.00)
Glucose, Bld: 89 mg/dL (ref 70–99)
Potassium: 4.2 meq/L (ref 3.5–5.1)
Sodium: 141 meq/L (ref 135–145)
Total Bilirubin: 0.6 mg/dL (ref 0.2–1.2)
Total Protein: 6.5 g/dL (ref 6.0–8.3)

## 2024-01-30 LAB — LIPID PANEL
Cholesterol: 118 mg/dL (ref 0–200)
HDL: 52.1 mg/dL (ref >39.00)
LDL Cholesterol: 56 mg/dL (ref 0–99)
NonHDL: 65.78
Total CHOL/HDL Ratio: 2
Triglycerides: 47 mg/dL (ref 0.0–149.0)
VLDL: 9.4 mg/dL (ref 0.0–40.0)

## 2024-01-30 LAB — T4, FREE: Free T4: 0.79 ng/dL (ref 0.60–1.60)

## 2024-01-30 LAB — T3, FREE: T3, Free: 3.1 pg/mL (ref 2.3–4.2)

## 2024-01-30 NOTE — Progress Notes (Signed)
 No critical labs need to be addressed urgently. We will discuss labs in detail at upcoming office visit.

## 2024-01-31 ENCOUNTER — Ambulatory Visit

## 2024-01-31 ENCOUNTER — Ambulatory Visit
Admission: RE | Admit: 2024-01-31 | Discharge: 2024-01-31 | Disposition: A | Source: Ambulatory Visit | Attending: General Surgery | Admitting: General Surgery

## 2024-01-31 DIAGNOSIS — R928 Other abnormal and inconclusive findings on diagnostic imaging of breast: Secondary | ICD-10-CM | POA: Diagnosis not present

## 2024-01-31 DIAGNOSIS — D242 Benign neoplasm of left breast: Secondary | ICD-10-CM

## 2024-02-06 ENCOUNTER — Ambulatory Visit: Admitting: Family Medicine

## 2024-02-06 ENCOUNTER — Ambulatory Visit: Payer: Self-pay | Admitting: Family Medicine

## 2024-02-06 ENCOUNTER — Encounter: Payer: Self-pay | Admitting: Family Medicine

## 2024-02-06 VITALS — BP 100/60 | HR 78 | Temp 97.6°F | Ht 64.5 in | Wt 172.4 lb

## 2024-02-06 DIAGNOSIS — Z853 Personal history of malignant neoplasm of breast: Secondary | ICD-10-CM | POA: Diagnosis not present

## 2024-02-06 DIAGNOSIS — Z0000 Encounter for general adult medical examination without abnormal findings: Secondary | ICD-10-CM

## 2024-02-06 DIAGNOSIS — R413 Other amnesia: Secondary | ICD-10-CM | POA: Diagnosis not present

## 2024-02-06 DIAGNOSIS — F331 Major depressive disorder, recurrent, moderate: Secondary | ICD-10-CM

## 2024-02-06 DIAGNOSIS — E785 Hyperlipidemia, unspecified: Secondary | ICD-10-CM

## 2024-02-06 DIAGNOSIS — N3281 Overactive bladder: Secondary | ICD-10-CM | POA: Insufficient documentation

## 2024-02-06 DIAGNOSIS — N3946 Mixed incontinence: Secondary | ICD-10-CM | POA: Diagnosis not present

## 2024-02-06 DIAGNOSIS — N8110 Cystocele, unspecified: Secondary | ICD-10-CM | POA: Diagnosis not present

## 2024-02-06 DIAGNOSIS — Z23 Encounter for immunization: Secondary | ICD-10-CM | POA: Diagnosis not present

## 2024-02-06 DIAGNOSIS — M818 Other osteoporosis without current pathological fracture: Secondary | ICD-10-CM

## 2024-02-06 LAB — CBC WITH DIFFERENTIAL/PLATELET
Basophils Absolute: 0 K/uL (ref 0.0–0.1)
Basophils Relative: 0.5 % (ref 0.0–3.0)
Eosinophils Absolute: 0.2 K/uL (ref 0.0–0.7)
Eosinophils Relative: 5 % (ref 0.0–5.0)
HCT: 41.2 % (ref 36.0–46.0)
Hemoglobin: 13.3 g/dL (ref 12.0–15.0)
Lymphocytes Relative: 37.3 % (ref 12.0–46.0)
Lymphs Abs: 1.8 K/uL (ref 0.7–4.0)
MCHC: 32.4 g/dL (ref 30.0–36.0)
MCV: 89.5 fl (ref 78.0–100.0)
Monocytes Absolute: 0.4 K/uL (ref 0.1–1.0)
Monocytes Relative: 7.3 % (ref 3.0–12.0)
Neutro Abs: 2.4 K/uL (ref 1.4–7.7)
Neutrophils Relative %: 49.9 % (ref 43.0–77.0)
Platelets: 177 K/uL (ref 150.0–400.0)
RBC: 4.6 Mil/uL (ref 3.87–5.11)
RDW: 14 % (ref 11.5–15.5)
WBC: 4.8 K/uL (ref 4.0–10.5)

## 2024-02-06 LAB — POC URINALSYSI DIPSTICK (AUTOMATED)
Bilirubin, UA: NEGATIVE
Blood, UA: NEGATIVE
Glucose, UA: NEGATIVE
Ketones, UA: NEGATIVE
Leukocytes, UA: NEGATIVE
Nitrite, UA: NEGATIVE
Protein, UA: NEGATIVE
Spec Grav, UA: 1.015 (ref 1.010–1.025)
Urobilinogen, UA: 0.2 U/dL
pH, UA: 6 (ref 5.0–8.0)

## 2024-02-06 LAB — VITAMIN D 25 HYDROXY (VIT D DEFICIENCY, FRACTURES): VITD: 36.04 ng/mL (ref 30.00–100.00)

## 2024-02-06 LAB — VITAMIN B12: Vitamin B-12: 286 pg/mL (ref 211–911)

## 2024-02-06 NOTE — Assessment & Plan Note (Signed)
 New, worsening... UA clear in office today.  If she cannot get in with UroGYN shortly, we will consider  med like Gemtessa.

## 2024-02-06 NOTE — Assessment & Plan Note (Signed)
 Stable, chronic.  Continue current medication.  LDL at goal on atorvastatin  10 mg p.o. daily.  LDL goal less than 70 given coronary artery disease.

## 2024-02-06 NOTE — Addendum Note (Signed)
 Addended by: WENDELL ARLAND RAMAN on: 02/06/2024 09:51 AM   Modules accepted: Orders

## 2024-02-06 NOTE — Assessment & Plan Note (Signed)
 Recurrent breast cancer  S/P tamoxifen  course.  S/P  right mastectomy.. needs mastectomy bra and supplies.

## 2024-02-06 NOTE — Progress Notes (Signed)
 Patient ID: SELINA TAPPER, female    DOB: January 28, 1956, 68 y.o.   MRN: [id number]  This visit was conducted in person.  BP 100/60   Pulse 78   Temp 97.6 F (36.4 C) (Temporal)   Ht 5' 4.5 (1.638 m)   Wt 172 lb 6 oz (78.2 kg)   SpO2 95%   BMI 29.13 kg/m    CC:  Chief Complaint  Patient presents with   Annual Exam    Subjective:   HPI: ROSAMUND NYLAND is a 68 y.o. female presenting on 02/06/2024 for Annual Exam  The patient presents for  complete physical and review of chronic health problems. He/She also has the following acute concerns today:    She has  issues with urinary urgency ongoing for years, getting worse. No dysuria.  Some stress incontinence, occ urge incontinence.  She feels some vaginal pressure.  History of partial hysterectomy.   She has been under more stress lately.  She has noted gradually worsening memory.  She will return for AMW visit.  MDD, recurrent : Well-controlled on sertraline  100 mg daily and using trazodone  25 to 50 mg at bedtime for insomnia Flowsheet Row Office Visit from 02/06/2024 in [facility] Total Score 2   History of breast cancer, recurrent: Has completed tamoxifen  course.  Elevated Cholesterol:    LDL at goal on atorvastatin  10 mg p.o. daily.  LDL goal less than 70 given coronary artery disease. Lab Results  Component Value Date   CHOL 118 01/30/2024   HDL 52.10 01/30/2024   LDLCALC 56 01/30/2024   TRIG 47.0 01/30/2024   CHOLHDL 2 01/30/2024  Using medications without problems: Muscle aches:  Diet compliance: moderate Exercise: None Other complaints: Wt Readings from Last 3 Encounters:  02/06/24 172 lb 6 oz (78.2 kg)  05/25/23 173 lb 12.8 oz (78.8 kg)  01/18/23 165 lb (74.8 kg)     Relevant past medical, surgical, family and social history reviewed and updated as indicated. Interim medical history since our last visit reviewed. Allergies and medications reviewed and  updated. Outpatient Medications Prior to Visit  Medication Sig Dispense Refill   aspirin  EC 81 MG tablet Take 1 tablet (81 mg total) by mouth daily. 90 tablet 3   atorvastatin  (LIPITOR) 10 MG tablet TAKE 1 TABLET BY MOUTH EVERY DAY 90 tablet 3   calcium  citrate-vitamin D  (CITRACAL+D) 315-200 MG-UNIT per tablet Take 1 tablet by mouth 2 (two) times daily.     ibandronate  (BONIVA ) 150 MG tablet TAKE 1 TABLET (150 MG TOTAL) BY MOUTH EVERY 30 (THIRTY) DAYS. TAKE IN THE MORNING WITH A FULL GLASS OF WATER, ON AN EMPTY STOMACH, AND DO NOT TAKE ANYTHING ELSE BY MOUTH OR LIE DOWN FOR THE NEXT 30 MIN. 3 tablet 0   metoprolol  succinate (TOPROL -XL) 25 MG 24 hr tablet TAKE 1/2 TABLET BY MOUTH DAILY ONCE DAILY 45 tablet 3   nitroGLYCERIN  (NITROSTAT ) 0.4 MG SL tablet Place 1 tablet (0.4 mg total) under the tongue every 5 (five) minutes as needed for chest pain. 25 tablet 3   sertraline  (ZOLOFT ) 100 MG tablet TAKE 1 TABLET BY MOUTH EVERY DAY 90 tablet 0   traZODone  (DESYREL ) 50 MG tablet TAKE 0.5-1 TABLETS BY MOUTH AT BEDTIME AS NEEDED FOR SLEEP. 90 tablet 0   meloxicam  (MOBIC ) 15 MG tablet TAKE 1 TABLET (15 MG TOTAL) BY MOUTH DAILY. (Patient not taking: Reported on 02/06/2024) 30 tablet 0   No facility-administered  medications prior to visit.     Per HPI unless specifically indicated in ROS section below Review of Systems  Constitutional:  Negative for fatigue and fever.  HENT:  Negative for congestion.   Eyes:  Negative for pain.  Respiratory:  Negative for cough and shortness of breath.   Cardiovascular:  Negative for chest pain, palpitations and leg swelling.  Gastrointestinal:  Negative for abdominal pain.  Genitourinary:  Negative for dysuria and vaginal bleeding.  Musculoskeletal:  Negative for back pain.  Neurological:  Negative for syncope, light-headedness and headaches.  Psychiatric/Behavioral:  Negative for dysphoric mood.    Objective:  BP 100/60   Pulse 78   Temp 97.6 F (36.4 C)  (Temporal)   Ht 5' 4.5 (1.638 m)   Wt 172 lb 6 oz (78.2 kg)   SpO2 95%   BMI 29.13 kg/m   Wt Readings from Last 3 Encounters:  02/06/24 172 lb 6 oz (78.2 kg)  05/25/23 173 lb 12.8 oz (78.8 kg)  01/18/23 165 lb (74.8 kg)      Physical Exam Vitals and nursing note reviewed.  Constitutional:      General: She is not in acute distress.    Appearance: Normal appearance. She is well-developed. She is not ill-appearing or toxic-appearing.  HENT:     Head: Normocephalic.     Right Ear: Hearing, tympanic membrane, ear canal and external ear normal.     Left Ear: Hearing, tympanic membrane, ear canal and external ear normal.     Nose: Nose normal.  Eyes:     General: Lids are normal. Lids are everted, no foreign bodies appreciated.     Conjunctiva/sclera: Conjunctivae normal.     Pupils: Pupils are equal, round, and reactive to light.  Neck:     Thyroid : No thyroid  mass or thyromegaly.     Vascular: No carotid bruit.     Trachea: Trachea normal.  Cardiovascular:     Rate and Rhythm: Normal rate and regular rhythm.     Heart sounds: Normal heart sounds, S1 normal and S2 normal. No murmur heard.    No gallop.  Pulmonary:     Effort: Pulmonary effort is normal. No respiratory distress.     Breath sounds: Normal breath sounds. No wheezing, rhonchi or rales.  Abdominal:     General: Bowel sounds are normal. There is no distension or abdominal bruit.     Palpations: Abdomen is soft. There is no fluid wave or mass.     Tenderness: There is no abdominal tenderness. There is no guarding or rebound.     Hernia: No hernia is present.  Musculoskeletal:     Cervical back: Normal range of motion and neck supple.  Lymphadenopathy:     Cervical: No cervical adenopathy.  Skin:    General: Skin is warm and dry.     Findings: No rash.  Neurological:     Mental Status: She is alert.     Cranial Nerves: No cranial nerve deficit.     Sensory: No sensory deficit.  Psychiatric:        Mood and  Affect: Mood is not anxious or depressed.        Speech: Speech normal.        Behavior: Behavior normal. Behavior is cooperative.        Judgment: Judgment normal.       Results for orders placed or performed in visit on 01/30/24  T3, free   Collection Time: 01/30/24  7:40 AM  Result Value Ref Range   T3, Free 3.1 2.3 - 4.2 pg/mL  T4, free   Collection Time: 01/30/24  7:40 AM  Result Value Ref Range   Free T4 0.79 0.60 - 1.60 ng/dL  TSH   Collection Time: 01/30/24  7:40 AM  Result Value Ref Range   TSH 0.59 0.35 - 5.50 uIU/mL  Comprehensive metabolic panel   Collection Time: 01/30/24  7:40 AM  Result Value Ref Range   Sodium 141 135 - 145 mEq/L   Potassium 4.2 3.5 - 5.1 mEq/L   Chloride 104 96 - 112 mEq/L   CO2 31 19 - 32 mEq/L   Glucose, Bld 89 70 - 99 mg/dL   BUN 19 6 - 23 mg/dL   Creatinine, Ser 9.31 0.40 - 1.20 mg/dL   Total Bilirubin 0.6 0.2 - 1.2 mg/dL   Alkaline Phosphatase 49 39 - 117 U/L   AST 15 0 - 37 U/L   ALT 12 0 - 35 U/L   Total Protein 6.5 6.0 - 8.3 g/dL   Albumin 4.3 3.5 - 5.2 g/dL   GFR 10.90 >39.99 mL/min   Calcium  8.8 8.4 - 10.5 mg/dL  Lipid panel   Collection Time: 01/30/24  7:40 AM  Result Value Ref Range   Cholesterol 118 0 - 200 mg/dL   Triglycerides 52.9 0.0 - 149.0 mg/dL   HDL 47.89 >60.99 mg/dL   VLDL 9.4 0.0 - 59.9 mg/dL   LDL Cholesterol 56 0 - 99 mg/dL   Total CHOL/HDL Ratio 2    NonHDL 65.78      COVID 19 screen:  No recent travel or known exposure to COVID19 The patient denies respiratory symptoms of COVID 19 at this time. The importance of social distancing was discussed today.   Assessment and Plan   The patient's preventative maintenance and recommended screening tests for an annual wellness exam were reviewed in full today. Brought up to date unless services declined.  Counselled on the importance of diet, exercise, and its role in overall health and mortality. The patient's FH and SH was reviewed, including their home  life, tobacco status, and drug and alcohol status.   Had partial hysterectomy .SABRAno further paps needed. Mammogram: .. breast cancer history rec yearly 07/2023 abn mammo, biopsy showed intraductal papilloma, had follow up  with Dr. Lu.. plan to re-eval in 6 months. Colon:  01/2017 nml , repeat in 10 years. Vaccines: uptodate,  Has  had 3 COVID vaccines. Flu  and TDap uptodate,  She is S/P shingrix  series. HIV: refused DEXA: 10/2021 improved  osteoporosis. Was On tamoxifen  for 2  year course.  Now off.  Was previously treated with Boniva  x 2 years prior to 2019, no SE....  new plan: restart BONVIA and recheck in 2 years ( 2025)  DUE  Problem List Items Addressed This Visit     History of breast cancer   Recurrent breast cancer  S/P tamoxifen  course.  S/P  right mastectomy.. needs mastectomy bra and supplies.      Hyperlipidemia LDL goal <70   Stable, chronic.  Continue current medication.  LDL at goal on atorvastatin  10 mg p.o. daily.  LDL goal less than 70 given coronary artery disease.      Major depression, recurrent (Chronic)   Well-controlled on sertraline  100 mg daily and using trazodone  25 to 50 mg at bedtime for insomnia      Memory change    New, eval with labs... will return for  MMSE.  Relevant Orders   Vitamin B12   CBC with Differential/Platelet   VITAMIN D  25 Hydroxy (Vit-D Deficiency, Fractures)   Mixed stress and urge incontinence   Relevant Orders   Ambulatory referral to Urogynecology   OAB (overactive bladder)    New, worsening... UA clear in office today.  If she cannot get in with UroGYN shortly, we will consider  med like [facility].      Relevant Orders   Ambulatory referral to Urogynecology   Osteoporosis    On Boniva , due for DEXA      Relevant Orders   DG Bone Density   Vaginal prolapse   ACute, presumed, no vaginal exam today.  Refer to urogyn for eval.      Relevant Orders   Ambulatory referral to Urogynecology   Other Visit  Diagnoses       Routine general medical examination at a health care facility    -  Primary     Need for influenza vaccination       Relevant Orders   Flu vaccine HIGH DOSE PF(Fluzone  Trivalent) (Completed)      No orders of the defined types were placed in this encounter.  Orders Placed This Encounter  Procedures   DG Bone Density    Reason for Exam (SYMPTOM  OR DIAGNOSIS REQUIRED):   osteoporosis    Preferred imaging location?:   [address]   Flu vaccine HIGH DOSE PF(Fluzone  Trivalent)   Vitamin B12   CBC with Differential/Platelet   VITAMIN D  25 Hydroxy (Vit-D Deficiency, Fractures)   Ambulatory referral to Urogynecology    Referral Priority:   Routine    Referral Type:   Consultation    Referral Reason:   Specialty Services Required    Requested Specialty:   Urology    Number of Visits Requested:   1    Greig Ring, MD

## 2024-02-06 NOTE — Assessment & Plan Note (Signed)
 On Boniva , due for DEXA

## 2024-02-06 NOTE — Assessment & Plan Note (Signed)
 New, eval with labs... will return for  MMSE.

## 2024-02-06 NOTE — Assessment & Plan Note (Signed)
Well-controlled on sertraline 100 mg daily and using trazodone 25 to 50 mg at bedtime for insomnia

## 2024-02-06 NOTE — Assessment & Plan Note (Signed)
 ACute, presumed, no vaginal exam today.  Refer to urogyn for eval.

## 2024-02-29 DIAGNOSIS — D242 Benign neoplasm of left breast: Secondary | ICD-10-CM | POA: Diagnosis not present

## 2024-03-01 ENCOUNTER — Ambulatory Visit (INDEPENDENT_AMBULATORY_CARE_PROVIDER_SITE_OTHER)

## 2024-03-01 VITALS — BP 104/62 | Ht 64.5 in | Wt 172.0 lb

## 2024-03-01 DIAGNOSIS — Z0000 Encounter for general adult medical examination without abnormal findings: Secondary | ICD-10-CM | POA: Diagnosis not present

## 2024-03-01 NOTE — Patient Instructions (Signed)
 Lisa Salinas,  Thank you for taking the time for your Medicare Wellness Visit. I appreciate your continued commitment to your health goals. Please review the care plan we discussed, and feel free to reach out if I can assist you further.  Please note that Annual Wellness Visits do not include a physical exam. Some assessments may be limited, especially if the visit was conducted virtually. If needed, we may recommend an in-person follow-up with your provider.  Ongoing Care Seeing your primary care provider every 3 to 6 months helps us  monitor your health and provide consistent, personalized care.   Referrals If a referral was made during today's visit and you haven't received any updates within two weeks, please contact the referred provider directly to check on the status.  Recommended Screenings:  Health Maintenance  Topic Date Due   DEXA scan (bone density measurement)  11/16/2023   COVID-19 Vaccine (4 - 2025-26 season) 12/18/2023   Medicare Annual Wellness Visit  01/03/2024   Breast Cancer Screening  08/14/2024   DTaP/Tdap/Td vaccine (2 - Td or Tdap) 08/18/2024   Colon Cancer Screening  02/08/2027   Pneumococcal Vaccine for age over 36  Completed   Flu Shot  Completed   Hepatitis C Screening  Completed   Zoster (Shingles) Vaccine  Completed   Meningitis B Vaccine  Aged Out       03/01/2024   11:10 AM  Advanced Directives  Does Patient Have a Medical Advance Directive? Yes  Type of Estate Agent of [place];Living will  Copy of Healthcare Power of Attorney in Chart? No - copy requested    Vision: Annual vision screenings are recommended for early detection of glaucoma, cataracts, and diabetic retinopathy. These exams can also reveal signs of chronic conditions such as diabetes and high blood pressure.  Dental: Annual dental screenings help detect early signs of oral cancer, gum disease, and other conditions linked to overall health, including heart  disease and diabetes.

## 2024-03-01 NOTE — Progress Notes (Signed)
 Chief Complaint  Patient presents with   Medicare Wellness     Subjective:   Lisa Salinas is a 68 y.o. female who presents for a Medicare Annual Wellness Visit.  Allergies (verified) Betadine [povidone iodine] and Sulfonamide derivatives   History: Past Medical History:  Diagnosis Date   Anxiety state, unspecified    Basal cell carcinoma (BCC) of face    Breast cancer, right breast (HCC) 10/20/2004   S/P lumpectomy (04/2005) and chemo (03/2005)   Coronary artery disease 02/2019   60-70% prox-mid LCx stenosi (FFR 0.76) - medical management   Depression    Dizziness and giddiness    Dysrhythmia    Family history of breast cancer    Personal history of chemotherapy 03/2005   Personal history of radiation therapy 05/2005   Past Surgical History:  Procedure Laterality Date   ABDOMINAL HYSTERECTOMY     left my ovaries; no cancer   BASAL CELL CARCINOMA EXCISION Right    side of face    BREAST BIOPSY Bilateral    2 on the left; 1 on the right; all benign   BREAST BIOPSY Right 10/20/2004   malignant   BREAST BIOPSY Left 08/18/2023   MM LT BREAST BX W LOC DEV 1ST LESION IMAGE BX SPEC STEREO GUIDE 08/18/2023 [facility]   BREAST BIOPSY Left 08/18/2023   MM LT BREAST BX W LOC DEV EA AD LESION IMG BX SPEC STEREO GUIDE 08/18/2023 [facility]   BREAST EXCISIONAL BIOPSY Left    BREAST LUMPECTOMY Right 04/2005   COLONOSCOPY     LEFT HEART CATH AND CORONARY ANGIOGRAPHY N/A 02/26/2019   Procedure: LEFT HEART CATH AND CORONARY ANGIOGRAPHY;  Surgeon: Claudene Victory ORN, MD;  Location: [facility];  Service: Cardiovascular;  Laterality: N/A;   MASTECTOMY COMPLETE / SIMPLE Right 10/17/2016   MASTECTOMY W/ SENTINEL NODE BIOPSY Right 10/17/2016   Procedure: RIGHT MASTECTOMY WITH SENTINEL LYMPH NODE BIOPSY;  Surgeon: Gail Favorite, MD;  Location: [facility] OR;  Service: General;  Laterality: Right;   TONSILLECTOMY  Childhood   Family History  Problem Relation Age of Onset    Heart disease Mother    Hypertension Mother    Hyperlipidemia Mother    Goiter Mother    Cancer Father 57       lung, smoker   Breast cancer Maternal Aunt 45   Breast cancer Paternal Aunt 66   Breast cancer Maternal Aunt 40   Breast cancer Cousin 52       paternal first cousin   Social History   Occupational History   Not on file  Tobacco Use   Smoking status: Never   Smokeless tobacco: Never  Vaping Use   Vaping status: Never Used  Substance and Sexual Activity   Alcohol use: No   Drug use: No   Sexual activity: Not on file   Tobacco Counseling Counseling given: Not Answered  SDOH Screenings   Food Insecurity: No Food Insecurity (03/01/2024)  Housing: Unknown (03/01/2024)  Transportation Needs: No Transportation Needs (03/01/2024)  Utilities: Not At Risk (03/01/2024)  Alcohol Screen: Low Risk  (01/03/2023)  Depression (PHQ2-9): Low Risk  (03/01/2024)  Recent Concern: Depression (PHQ2-9) - Medium Risk (02/06/2024)  Financial Resource Strain: Low Risk  (01/03/2023)  Physical Activity: Insufficiently Active (03/01/2024)  Social Connections: Socially Integrated (03/01/2024)  Stress: No Stress Concern Present (03/01/2024)  Tobacco Use: Low Risk  (03/01/2024)  Health Literacy: Adequate Health Literacy (03/01/2024)   See flowsheets for full screening details  Depression Screen PHQ 2 & 9 Depression Scale- Over the past 2 weeks, how often have you been bothered by any of the following problems? Little interest or pleasure in doing things: 0 Feeling down, depressed, or hopeless (PHQ Adolescent also includes...irritable): 1 PHQ-2 Total Score: 1 Trouble falling or staying asleep, or sleeping too much: 1 Feeling tired or having little energy: 2 Poor appetite or overeating (PHQ Adolescent also includes...weight loss): 0 Feeling bad about yourself - or that you are a failure or have let yourself or your family down: 0 Trouble concentrating on things, such as reading the  newspaper or watching television (PHQ Adolescent also includes...like school work): 1 Moving or speaking so slowly that other people could have noticed. Or the opposite - being so fidgety or restless that you have been moving around a lot more than usual: 2 Thoughts that you would be better off dead, or of hurting yourself in some way: 0 PHQ-9 Total Score: 8 If you checked off any problems, how difficult have these problems made it for you to do your work, take care of things at home, or get along with other people?: Not difficult at all  Depression Treatment Depression Interventions/Treatment : Counseling     Goals Addressed               This Visit's Progress     COMPLETED: Patient stated (pt-stated)        I'm trying to stop collecting stuff.      Patient Stated        I would like to stay well, continue dr visits and do what I need to do for mysel       Visit info / Clinical Intake: Medicare Wellness Visit Type:: Subsequent Annual Wellness Visit Persons participating in visit:: patient Medicare Wellness Visit Mode:: In-person (required for WTM) Information given by:: patient Interpreter Needed?: No Pre-visit prep was completed: yes AWV questionnaire completed by patient prior to visit?: no Living arrangements:: lives with spouse/significant other Patient's Overall Health Status Rating: good Typical amount of pain: some Does pain affect daily life?: (!) yes (limits activity) Are you currently prescribed opioids?: no  Dietary Habits and Nutritional Risks How many meals a day?: 2 Eats fruit and vegetables daily?: yes Most meals are obtained by: preparing own meals; eating out (a mix) Diabetic:: no  Functional Status Activities of Daily Living (to include ambulation/medication): Independent Ambulation: Independent Medication Administration: Independent Home Management: Independent Manage your own finances?: yes Primary transportation is: driving Concerns about  vision?: no *vision screening is required for WTM* Concerns about hearing?: no  Fall Screening Falls in the past year?: 0 Number of falls in past year: 0 Was there an injury with Fall?: 0 Fall Risk Category Calculator: 0 Patient Fall Risk Level: Low Fall Risk  Fall Risk Patient at Risk for Falls Due to: No Fall Risks Fall risk Follow up: Education provided; Falls prevention discussed  Home and Transportation Safety: All rugs have non-skid backing?: N/A, no rugs All stairs or steps have railings?: yes Grab bars in the bathtub or shower?: yes Have non-skid surface in bathtub or shower?: (!) no Good home lighting?: yes Regular seat belt use?: yes Hospital stays in the last year:: no  Cognitive Assessment Difficulty concentrating, remembering, or making decisions? : yes Will 6CIT or Mini Cog be Completed: yes What year is it?: 0 points What month is it?: 0 points Give patient an address phrase to remember (5 components): [address] [place]  About  what time is it?: 0 points Count backwards from 20 to 1: 0 points Say the months of the year in reverse: 0 points Repeat the address phrase from earlier: 0 points 6 CIT Score: 0 points  Advance Directives (For Healthcare) Does Patient Have a Medical Advance Directive?: Yes Type of Advance Directive: Healthcare Power of [place]; Living will Copy of Healthcare Power of Attorney in Chart?: No - copy requested Copy of Living Will in Chart?: No - copy requested  Reviewed/Updated  Reviewed/Updated: Reviewed All (Medical, Surgical, Family, Medications, Allergies, Care Teams, Patient Goals)        Objective:    Today's Vitals   03/01/24 1057  BP: 104/62  Weight: 172 lb (78 kg)  Height: 5' 4.5 (1.638 m)   Body mass index is 29.07 kg/m.  Current Medications (verified) Outpatient Encounter Medications as of 03/01/2024  Medication Sig   aspirin  EC 81 MG tablet Take 1 tablet (81 mg total) by mouth daily.    atorvastatin  (LIPITOR) 10 MG tablet TAKE 1 TABLET BY MOUTH EVERY DAY   calcium  citrate-vitamin D  (CITRACAL+D) 315-200 MG-UNIT per tablet Take 1 tablet by mouth 2 (two) times daily.   ibandronate  (BONIVA ) 150 MG tablet TAKE 1 TABLET (150 MG TOTAL) BY MOUTH EVERY 30 (THIRTY) DAYS. TAKE IN THE MORNING WITH A FULL GLASS OF WATER, ON AN EMPTY STOMACH, AND DO NOT TAKE ANYTHING ELSE BY MOUTH OR LIE DOWN FOR THE NEXT 30 MIN.   metoprolol  succinate (TOPROL -XL) 25 MG 24 hr tablet TAKE 1/2 TABLET BY MOUTH DAILY ONCE DAILY   nitroGLYCERIN  (NITROSTAT ) 0.4 MG SL tablet Place 1 tablet (0.4 mg total) under the tongue every 5 (five) minutes as needed for chest pain.   sertraline  (ZOLOFT ) 100 MG tablet TAKE 1 TABLET BY MOUTH EVERY DAY   traZODone  (DESYREL ) 50 MG tablet TAKE 0.5-1 TABLETS BY MOUTH AT BEDTIME AS NEEDED FOR SLEEP.   meloxicam  (MOBIC ) 15 MG tablet TAKE 1 TABLET (15 MG TOTAL) BY MOUTH DAILY. (Patient not taking: Reported on 03/01/2024)   No facility-administered encounter medications on file as of 03/01/2024.   Hearing/Vision screen Vision Screening - Comments:: UTD w/visits to Dr Christobal  Immunizations and Health Maintenance Health Maintenance  Topic Date Due   DEXA SCAN  11/16/2023   COVID-19 Vaccine (4 - 2025-26 season) 12/18/2023   Mammogram  08/14/2024   DTaP/Tdap/Td (2 - Td or Tdap) 08/18/2024   Medicare Annual Wellness (AWV)  03/01/2025   Colonoscopy  02/08/2027   Pneumococcal Vaccine: 50+ Years  Completed   Influenza Vaccine  Completed   Hepatitis C Screening  Completed   Zoster Vaccines- Shingrix   Completed   Meningococcal B Vaccine  Aged Out        Assessment/Plan:  This is a routine wellness examination for Lisa Salinas.  Patient Care Team: Avelina Greig BRAVO, MD as PCP - General End, Lonni, MD as PCP - Cardiology (Cardiology) Arelia Filippo, MD as Consulting Physician (Plastic Surgery) Gail Favorite, MD as Consulting Physician (General Surgery) Christobal Selinda HERO, OD  (Optometry)  I have personally reviewed and noted the following in the patient's chart:   Medical and social history Use of alcohol, tobacco or illicit drugs  Current medications and supplements including opioid prescriptions. Functional ability and status Nutritional status Physical activity Advanced directives List of other physicians Hospitalizations, surgeries, and ER visits in previous 12 months Vitals Screenings to include cognitive, depression, and falls Referrals and appointments  No orders of the defined types were placed in this encounter.  In addition, I have reviewed and discussed with patient certain preventive protocols, quality metrics, and best practice recommendations. A written personalized care plan for preventive services as well as general preventive health recommendations were provided to patient.   Erminio LITTIE Saris, LPN   [id number]   Return in 1 year (on 03/01/2025).  After Visit Summary: (In Person-Declined) Patient declined AVS at this time.  Nurse Notes: Pt has no concerns or questions. AWV/CPE simutaneously made for next year

## 2024-03-10 ENCOUNTER — Other Ambulatory Visit: Payer: Self-pay | Admitting: Family Medicine

## 2025-02-25 ENCOUNTER — Other Ambulatory Visit

## 2025-03-04 ENCOUNTER — Encounter: Admitting: Family Medicine

## 2025-03-04 ENCOUNTER — Ambulatory Visit
